# Patient Record
Sex: Male | Born: 1965
Health system: Southern US, Community
[De-identification: ages and names within clinical notes are randomized; demographics above are authoritative.]

## PROBLEM LIST (undated history)

## (undated) DIAGNOSIS — G4719 Other hypersomnia: Secondary | ICD-10-CM

## (undated) DIAGNOSIS — E7800 Pure hypercholesterolemia, unspecified: Secondary | ICD-10-CM

## (undated) DIAGNOSIS — G4733 Obstructive sleep apnea (adult) (pediatric): Secondary | ICD-10-CM

## (undated) DIAGNOSIS — G44009 Cluster headache syndrome, unspecified, not intractable: Secondary | ICD-10-CM

## (undated) DIAGNOSIS — R51 Headache: Secondary | ICD-10-CM

## (undated) DIAGNOSIS — I10 Essential (primary) hypertension: Secondary | ICD-10-CM

## (undated) DIAGNOSIS — J129 Viral pneumonia, unspecified: Secondary | ICD-10-CM

## (undated) DIAGNOSIS — R0600 Dyspnea, unspecified: Secondary | ICD-10-CM

## (undated) DIAGNOSIS — R432 Parageusia: Secondary | ICD-10-CM

## (undated) DIAGNOSIS — G510 Bell's palsy: Secondary | ICD-10-CM

## (undated) DIAGNOSIS — G4700 Insomnia, unspecified: Secondary | ICD-10-CM

## (undated) HISTORY — DX: Headache: R51

## (undated) HISTORY — PX: MOUTH SURGERY: SHX715

## (undated) HISTORY — DX: Essential (primary) hypertension: I10

## (undated) HISTORY — DX: Dyspnea, unspecified: R06.00

## (undated) HISTORY — DX: Obstructive sleep apnea (adult) (pediatric): G47.33

## (undated) HISTORY — PX: HERNIA REPAIR: SHX51

## (undated) HISTORY — DX: Viral pneumonia, unspecified: J12.9

## (undated) HISTORY — DX: Other hypersomnia: G47.19

## (undated) HISTORY — DX: Parageusia: R43.2

## (undated) HISTORY — DX: Insomnia, unspecified: G47.00

## (undated) HISTORY — DX: Pure hypercholesterolemia, unspecified: E78.00

## (undated) HISTORY — DX: Cluster headache syndrome, unspecified, not intractable: G44.009

## (undated) HISTORY — DX: Bell's palsy: G51.0

---

## 1998-05-20 ENCOUNTER — Ambulatory Visit (HOSPITAL_BASED_OUTPATIENT_CLINIC_OR_DEPARTMENT_OTHER): Admission: RE | Admit: 1998-05-20 | Discharge: 1998-05-20 | Payer: Self-pay | Admitting: General Surgery

## 2001-03-02 HISTORY — PX: CHOLECYSTECTOMY: SHX55

## 2001-06-01 ENCOUNTER — Emergency Department (HOSPITAL_COMMUNITY): Admission: EM | Admit: 2001-06-01 | Discharge: 2001-06-01 | Payer: Self-pay | Admitting: *Deleted

## 2001-06-01 ENCOUNTER — Encounter: Payer: Self-pay | Admitting: *Deleted

## 2006-03-02 DIAGNOSIS — G510 Bell's palsy: Secondary | ICD-10-CM

## 2006-03-02 HISTORY — DX: Bell's palsy: G51.0

## 2006-03-02 HISTORY — PX: FOOT SURGERY: SHX648

## 2006-10-26 ENCOUNTER — Emergency Department (HOSPITAL_COMMUNITY): Admission: EM | Admit: 2006-10-26 | Discharge: 2006-10-26 | Payer: Self-pay | Admitting: Emergency Medicine

## 2007-04-06 ENCOUNTER — Encounter: Admission: RE | Admit: 2007-04-06 | Discharge: 2007-06-08 | Payer: Self-pay | Admitting: Family Medicine

## 2007-09-26 ENCOUNTER — Encounter: Admission: RE | Admit: 2007-09-26 | Discharge: 2007-09-26 | Payer: Self-pay | Admitting: Family Medicine

## 2008-02-08 ENCOUNTER — Ambulatory Visit (HOSPITAL_BASED_OUTPATIENT_CLINIC_OR_DEPARTMENT_OTHER): Admission: RE | Admit: 2008-02-08 | Discharge: 2008-02-08 | Payer: Self-pay | Admitting: Orthopedic Surgery

## 2010-06-02 ENCOUNTER — Emergency Department (HOSPITAL_COMMUNITY): Payer: BC Managed Care – PPO

## 2010-06-02 ENCOUNTER — Emergency Department (HOSPITAL_COMMUNITY)
Admission: EM | Admit: 2010-06-02 | Discharge: 2010-06-02 | Disposition: A | Discharge status: Home / Self Care | Payer: BC Managed Care – PPO | Attending: Emergency Medicine | Admitting: Emergency Medicine

## 2010-06-02 DIAGNOSIS — I10 Essential (primary) hypertension: Secondary | ICD-10-CM | POA: Insufficient documentation

## 2010-06-02 DIAGNOSIS — R51 Headache: Secondary | ICD-10-CM | POA: Insufficient documentation

## 2010-07-15 NOTE — Op Note (Signed)
NAME:  Julian Oneal, Julian Oneal                 ACCOUNT NO.:  [id number]   MEDICAL RECORD NO.:  [id number]          PATIENT TYPE:  AMB   LOCATION:  DSC                          FACILITY:  [facility]   PHYSICIAN:  Leonides Grills, M.D.     DATE OF BIRTH:  [birth year]   DATE OF PROCEDURE:  02/08/2008  DATE OF DISCHARGE:                               OPERATIVE REPORT   PREOPERATIVE DIAGNOSIS:  Left hallux rigidus.   POSTOPERATIVE DIAGNOSIS:  Left hallux rigidus.   OPERATION:  Left great toe cheilectomy.   ANESTHESIA:  General.   SURGEON:  Leonides Grills, MD   ASSISTANT:  Richardean Canal, PA-C   ESTIMATED BLOOD LOSS:  Minimal.   TOURNIQUET TIME:  Approximately half hour.   COMPLICATIONS:  None.   DISPOSITION:  Stable to the PR.   INDICATIONS:  This is a 45 year old male who has had longstanding dorsal  left great toe pain that was interfering with his life to the point that  he cannot do what he wants to do.  He has consented to the above  procedure.  All risks including infection, nerve or vessel injury,  persistent pain, worsening pain, prolonged recovery, stiffness,  arthritis, and possibility of future fusion versus hemiarthroplasty were  all explained.  Questions were encouraged and answered.   OPERATIVE PROCEDURE:  The patient was brought to the operating room,  placed in supine position after adequate general endotracheal anesthesia  was administered as well as Ancef 1 g IV piggyback.  Left lower  extremity was then prepped and draped in sterile manner.  Over a  proximally placed thigh tourniquet, limb was gravity exsanguinated,  tourniquet was elevated to 290 mmHg.  Longitudinal incision just medial  to the EHL tendon was then made.  Dissection was carried down through  skin.  Hemostasis was obtained.  EHL tendon was protected within its  sheath and retracted out of harm's way.  Longitudinal capsulotomy was  then made.  Soft tissue was elevated off the dorsal spur both medially  and  laterally, and with a sagittal saw, the dorsal approximately quarter  to third of the metatarsal head was then removed, which was arthritic.  Once this was removed, both medial and lateral gutters were inspected,  and whatever spur was in this area was also debrided as well.  The base  of the proximal phalanx was also debrided of spurs with the rongeur.  The joint area was copiously irrigated with normal saline and then joint  was ranged after, and it was found to have excellent range of motion, no  impingement.  Bone wax was applied to  exposed bone surfaces.  The capsule was closed with a 3-0 Vicryl stitch.  Tourniquet was deflated.  Hemostasis was obtained.  Subcu was closed  with 3-0 Vicryl.  Skin was closed with 4-0 nylon.  Sterile dressing was  applied.  Hard-soled shoe was applied.  The patient was stable to the  PR.      Leonides Grills, M.D.  Electronically Signed     PB/MEDQ  D:  02/08/2008  T:  02/08/2008  Job:  [id number] 

## 2010-12-04 LAB — BASIC METABOLIC PANEL
BUN: 9 mg/dL (ref 6–23)
CO2: 30 mEq/L (ref 19–32)
Calcium: 9.6 mg/dL (ref 8.4–10.5)
Chloride: 103 mEq/L (ref 96–112)
Creatinine, Ser: 0.96 mg/dL (ref 0.4–1.5)
GFR calc Af Amer: >60 mL/min (ref >60)
GFR calc non Af Amer: >60 mL/min (ref >60)
Glucose, Bld: 93 mg/dL (ref 70–99)
Potassium: 3.9 mEq/L (ref 3.5–5.1)
Sodium: 138 mEq/L (ref 135–145)

## 2010-12-04 LAB — POCT HEMOGLOBIN-HEMACUE: Hemoglobin: 15.1 g/dL (ref 13.0–17.0)

## 2010-12-12 LAB — ROCKY MTN SPOTTED FVR AB, IGM-BLOOD: RMSF IgM: 0.1

## 2010-12-12 LAB — ROCKY MTN SPOTTED FVR AB, IGG-BLOOD: RMSF IgG: <1:64 {titer}

## 2010-12-12 LAB — B. BURGDORFI ANTIBODIES: B burgdorferi Ab IgG+IgM: 0.13

## 2011-12-25 ENCOUNTER — Other Ambulatory Visit: Payer: Self-pay | Admitting: Neurology

## 2011-12-25 DIAGNOSIS — H539 Unspecified visual disturbance: Secondary | ICD-10-CM

## 2011-12-25 DIAGNOSIS — R439 Unspecified disturbances of smell and taste: Secondary | ICD-10-CM

## 2011-12-25 DIAGNOSIS — G44059 Short lasting unilateral neuralgiform headache with conjunctival injection and tearing (SUNCT), not intractable: Secondary | ICD-10-CM

## 2012-01-01 ENCOUNTER — Ambulatory Visit
Admission: RE | Admit: 2012-01-01 | Discharge: 2012-01-01 | Disposition: A | Discharge status: Home / Self Care | Payer: BC Managed Care – PPO | Source: Ambulatory Visit | Attending: Neurology | Admitting: Neurology

## 2012-01-01 ENCOUNTER — Other Ambulatory Visit: Payer: BC Managed Care – PPO

## 2012-01-01 DIAGNOSIS — H539 Unspecified visual disturbance: Secondary | ICD-10-CM

## 2012-01-01 DIAGNOSIS — G44059 Short lasting unilateral neuralgiform headache with conjunctival injection and tearing (SUNCT), not intractable: Secondary | ICD-10-CM

## 2012-01-01 DIAGNOSIS — R439 Unspecified disturbances of smell and taste: Secondary | ICD-10-CM

## 2012-07-04 ENCOUNTER — Encounter: Payer: Self-pay | Admitting: Neurology

## 2012-07-05 ENCOUNTER — Ambulatory Visit (INDEPENDENT_AMBULATORY_CARE_PROVIDER_SITE_OTHER): Payer: 59 | Admitting: Neurology

## 2012-07-05 ENCOUNTER — Encounter: Payer: Self-pay | Admitting: Neurology

## 2012-07-05 ENCOUNTER — Other Ambulatory Visit: Payer: Self-pay | Admitting: Neurology

## 2012-07-05 VITALS — BP 136/86 | HR 76 | Ht 70.5 in | Wt 176.0 lb

## 2012-07-05 DIAGNOSIS — G4700 Insomnia, unspecified: Secondary | ICD-10-CM

## 2012-07-05 DIAGNOSIS — R51 Headache: Secondary | ICD-10-CM

## 2012-07-05 DIAGNOSIS — R519 Headache, unspecified: Secondary | ICD-10-CM

## 2012-07-05 DIAGNOSIS — G4733 Obstructive sleep apnea (adult) (pediatric): Secondary | ICD-10-CM

## 2012-07-05 HISTORY — DX: Headache, unspecified: R51.9

## 2012-07-05 HISTORY — DX: Obstructive sleep apnea (adult) (pediatric): G47.33

## 2012-07-05 MED ORDER — LUNESTA 3 MG PO TABS: 3.0000 mg | ORAL_TABLET | Freq: Every day | ORAL | Status: DC

## 2012-07-05 MED ORDER — TRAZODONE HCL 150 MG PO TABS: ORAL_TABLET | ORAL | Status: DC

## 2012-07-05 NOTE — Patient Instructions (Signed)

## 2012-07-05 NOTE — Progress Notes (Signed)
[facility]  Provider:  Dr Vickey Huger Referring Provider: No ref. provider found Primary Care Physician:  Lupita Raider, MD  Chief Complaint  Patient presents with  . Follow-up    rev, sleep, rm 10    HPI:  Julian Oneal is a 47 y.o. male here as a referral from Dr.  Clelia Croft , and returned for a follow up today.  [name]. 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  The patient was treated for Bell's palsy in 2008 when he started taking amitriptyline. Initially he slept well deep and he cleaned. Later he developed visual changes dry mouth, palpitations, and constipation. He also developed frequent headaches that were described as dull, throbbing and responding initially to nonsteroidal anti-inflammatory medication. He had previously the worst headaches with the onset of the Bell's palsy and he still presents with a mild residual left facial weakness. He reports crocodile tears but eating sweets or so a full. Occasional he witnessed a facial tics ,  But these  may not be visible on the outside.  06/24/2011 the patient underwent an MRI of the brain which returned as entirely normal he reports that he was sleeping only maybe 5 hours on trazodone which he had initiated.  He woke up with a headache daily and went to bed on wall space with a headache. He was by a diagnosed polysomnography, which confirmed sleep apnea . A CPAP machine was ordered:  his baseline study she'll 28 apneas and hypopneas per hour of sleep. After the titration to 6 cm water he hadn't residual AHI of only 2.3 per hour of sleep.  But he continued to report morning headaches as well as late evening headaches.  He also reports a right-sided retro-orbital pain, at times has right-sided pulses redness tearing but seems to be a manifestation of SUNCT.  He also continued complaining of insomnia his sleep  had more deteriorated .  The patient  followed upon my referral with Dr. Neale Burly at [facility]. He  was initially responding very well to the treatments and reports a period from October 2013 through January of this year  during which he was  headache free.   Then he developed pneumonia late January /february 2014  had to take several antibiotics and inhalers, following that he  developed insomnia and his headaches resurfaced.  The CT of the head was performed on 01/01/2012 and was entirely normal it was compared to a study from 06-2010  Dr. Onnie Boer  Reports state that the patient reported still abnormalities ,  last visit. He started the patient on topiramate for headache prevention, on a wellness diet for weight loss and recommended daily exercises, he also wanted the patient to have a  trigger point injection, which  has not yet taken place.  I also received a download of his CPAP machine today it is set at 6 cm water and his residual AHI is 0.8, his average daily usage was 5 hours and 13 minutes the patient is 100% compliant.  The Epworth sleepiness score  was 20 points today  Review of Systems: Out of a complete 14 system review, the patient complains of only the following symptoms, and all other reviewed systems are negative. Insomnia, , severe headaches, EDS of 20 points on Epworth.  SOB , facial tics, facial weakness.   History   Social History  . Marital Status:  Married    Spouse Name: N/A    Number of Children: 0  . Years of Education: N/A   Occupational History  . fireman     [employer]   Social History Main Topics  . Smoking status: Former Smoker    Start date: 03/02/1990  . Smokeless tobacco: Not on file  . Alcohol Use: Yes     Comment: occasionally  . Drug Use: No  . Sexually Active: Not on file   Other Topics Concern  . 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 pt denies any illegal drugs, tobacco use(quit in 1992), consumes alcohol occasionally and consumes coffee.    Family History  Problem Relation Age of Onset  . Cancer - Prostate Father   . Parkinson's disease Maternal Uncle   . Alzheimer's disease Paternal Aunt     in their late 62's  . Alzheimer's disease Paternal Uncle     in their late 52's  . Cancer - Prostate Maternal Grandfather     Past Medical History  Diagnosis Date  . Bell's palsy 2008    Dr Anne Hahn  . Insomnia   . Taste absent   . Hypertension   . High cholesterol     Past Surgical History  Procedure Laterality Date  . Hernia repair  [id number]  . Mouth surgery    . Foot surgery  2008    Current Outpatient Prescriptions  Medication Sig Dispense Refill  . ALBUTEROL IN Inhale into the lungs daily. As needed      . Azithromycin (ZITHROMAX Z-PAK PO) Take by mouth. Take as directed for sinusitis      . Choline Fenofibrate (TRILIPIX) 135 MG capsule Take 135 mg by mouth daily.      . Cyanocobalamin (VITAMIN B 12 PO) Take 1,000 mcg by mouth. One tablet sublingual once daily      . FOLIC ACID PO Take 400 mcg by mouth. One tablet daily      . hydrochlorothiazide (HYDRODIURIL) 25 MG tablet Take 25 mg by mouth daily.      Marland Kitchen lisinopril (PRINIVIL,ZESTRIL) 10 MG tablet Take 10 mg by mouth daily.      . Omega-3 Fatty Acids (FISH OIL) 1000 MG CAPS Take by mouth. One tablet three times daily      . tadalafil (CIALIS) 20 MG tablet Take 20 mg by mouth daily as needed for erectile dysfunction. One tablet daily      . tiZANidine (ZANAFLEX) 4 MG tablet Take 4 mg by mouth daily. As needed up to 2 per week      . TOPIRAMATE PO Take 150 mg by mouth at bedtime.      . traZODone (DESYREL) 150 MG tablet Take 150 mg by mouth at bedtime. Take 1/2 at night      . valproate (DEPACON) 500 MG/5ML SOLN Inject into the vein. Give IV once, if partial relief , give dose  repeat      . benzonatate (TESSALON) 200 MG capsule       . chlorpheniramine-HYDROcodone (TUSSIONEX) 10-8 MG/5ML LQCR       . fenofibrate micronized (ANTARA) 130 MG capsule       . HYDROcodone-homatropine (HYCODAN) 5-1.5 MG/5ML syrup       . levofloxacin (LEVAQUIN) 750 MG tablet       . VENTOLIN HFA 108 (90 BASE) MCG/ACT inhaler  No current facility-administered medications for this visit.    Allergies as of 07/05/2012 - Review Complete 07/05/2012  Allergen Reaction Noted  . Amitriptyline  07/04/2012  . Silenor (doxepin hcl)  07/04/2012  . Tribenzor (olmesartan-amlodipine-hctz)  07/04/2012    Vitals: BP 136/86  Pulse 76  Ht 5' 10.5" (1.791 m)  Wt 176 lb (79.833 kg)  BMI 24.89 kg/m2 Last Weight:  Wt Readings from Last 1 Encounters:  07/05/12 176 lb (79.833 kg)   Last Height:   Ht Readings from Last 1 Encounters:  07/05/12 5' 10.5" (1.791 m)   Vision Screening:  Vitals . Physical exam:  General: The patient is awake, fatigued and appears not in acute distress. He is just very tired-  The patient is well groomed. Head: Normocephalic, atraumatic. Neck is supple. Mallampati 2, neck circumference:16 . Cardiovascular:  Regular rate and rhythm, without  murmurs or carotid bruit, and without distended neck veins. Respiratory: Lungs are clear to auscultation. Skin:  Without evidence of edema, or rash Trunk:patient  has normal posture.patient has a normal BMI .   Neurologic exam : The patient is awake and alert, oriented to place and time.  Memory subjective  described as intact.  There is a normal attention span & concentration ability. Speech is fluent without  dysarthria, dysphonia or aphasia.  Mood and affect are depressed.  Cranial nerves: Pupils are equal and briskly reactive to light. Funduscopic exam without  evidence of pallor or edema. Extraocular movements  in vertical and horizontal planes intact and without nystagmus. Visual fields by finger perimetry are  intact. Hearing to finger rub intact.  Facial weakness as residual of old bell's palsy.  Sensation intact to fine touch. Facial ,tongue and uvula move midline.  Motor exam:   Normal tone and normal muscle bulk and symmetric normal strength in all extremities.  Sensory:  Fine touch, pinprick and vibration were tested in all extremities. Proprioception is tested in the upper extremities only. This was  normal.  Coordination: Rapid alternating movements in the fingers/hands is tested and normal. Finger-to-nose maneuver tested and normal without evidence of ataxia, dysmetria or tremor.  Gait and station: Patient walks without assistive device and is able and assisted stool climb up to the exam table.  Strength within normal limits. Stance is stable and normal. Tandem gait is normal .  Deep tendon reflexes: in the  upper and lower extremities are symmetric and intact.   Assessment:  After physical and neurologic examination, review of laboratory studies, imaging, neurophysiology testing and pre-existing records, assessment will be reviewed on the problem list.  Plan:  Treatment plan and additional workup will be reviewed under Problem List.

## 2012-08-02 ENCOUNTER — Ambulatory Visit: Payer: Self-pay | Admitting: *Deleted

## 2012-08-02 ENCOUNTER — Encounter: Payer: Self-pay | Admitting: Neurology

## 2012-08-02 ENCOUNTER — Ambulatory Visit (INDEPENDENT_AMBULATORY_CARE_PROVIDER_SITE_OTHER): Payer: 59 | Admitting: Neurology

## 2012-08-02 VITALS — BP 126/76 | HR 81 | Temp 98.7°F

## 2012-08-02 VITALS — BP 126/76 | HR 81 | Temp 98.7°F | Ht 70.5 in | Wt 181.0 lb

## 2012-08-02 DIAGNOSIS — G44209 Tension-type headache, unspecified, not intractable: Secondary | ICD-10-CM

## 2012-08-02 DIAGNOSIS — G4700 Insomnia, unspecified: Secondary | ICD-10-CM

## 2012-08-02 DIAGNOSIS — G43901 Migraine, unspecified, not intractable, with status migrainosus: Secondary | ICD-10-CM

## 2012-08-02 DIAGNOSIS — G4733 Obstructive sleep apnea (adult) (pediatric): Secondary | ICD-10-CM

## 2012-08-02 DIAGNOSIS — G4730 Sleep apnea, unspecified: Secondary | ICD-10-CM | POA: Insufficient documentation

## 2012-08-02 DIAGNOSIS — G44059 Short lasting unilateral neuralgiform headache with conjunctival injection and tearing (SUNCT), not intractable: Secondary | ICD-10-CM

## 2012-08-02 MED ORDER — TRAZODONE HCL 150 MG PO TABS: ORAL_TABLET | ORAL | Status: DC

## 2012-08-02 MED ORDER — VALPROATE SODIUM 500 MG/5ML IV SOLN: 500.0000 mg | INTRAVENOUS | Status: DC

## 2012-08-02 MED ORDER — DIVALPROEX SODIUM 500 MG PO DR TAB: 500.0000 mg | DELAYED_RELEASE_TABLET | Freq: Two times a day (BID) | ORAL | Status: DC

## 2012-08-02 MED ORDER — TIZANIDINE HCL 4 MG PO TABS: 4.0000 mg | ORAL_TABLET | Freq: Every day | ORAL | Status: DC

## 2012-08-02 MED ORDER — VALPROATE SODIUM 500 MG/5ML IV SOLN
500.0000 mg | INTRAVENOUS | Status: DC
  Administered 2012-08-02: 500 mg via INTRAVENOUS

## 2012-08-02 NOTE — Progress Notes (Signed)
Pt here for appt with Dr. Vickey Huger.  Order for Depacon 500mg  IV and if partial relief may repeat.  Pt to treatment room, Under aseptic technique 22g angiocath inserted to R outer AC with good blood return, taped securely.  IV started with NS/ 500mg  Depacon at 1642 , headache level 3-4, frontal.  Pt made comfortable, lights dimmed. At  1648 headache level to 2, another Depacon 500 mg IV started, finished 1658 headache level to 0-1.   Tolerated well.  NAD.   To check out.

## 2012-08-02 NOTE — Patient Instructions (Addendum)
Pt to call back if problems or concerns.

## 2012-08-02 NOTE — Assessment & Plan Note (Signed)
Trazodone for insomnia works, patient has less headaches on Depakote.

## 2012-08-02 NOTE — Progress Notes (Signed)
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 He was originally referred for evaluation of insomnia which had been chronic.   The patient was treated for Bell's palsy in 2008 when he started taking amitriptyline. Initially he slept well deep and he cleaned. Later he developed visual changes dry mouth, palpitations, and constipation. He also developed frequent headaches that were described as dull, throbbing and responding initially to nonsteroidal anti-inflammatory medication. He had previously the worst headaches with the onset of the Bell's palsy and he still presents with a mild residual left facial weakness.  He reports crocodile tears but eating sweets or so a full. Occasional he witnessed a facial tics , But these may not be visible on the outside.    06/24/2011 the patient underwent an MRI of the brain which returned as entirely normal he reports that he was sleeping only maybe 5 hours on trazodone which he had initiated.  He woke up with a headache daily and went to bed on wall space with a headache. Laboratory results were all in normal limits White blood cell count electrolytes, C. reactive protein, ANA. The CT of the head was performed on 01/01/2012 and read by Dr. Pearlean Brownie is normal.   08-04-11 He was by a diagnosed polysomnography, which confirmed sleep apnea . A CPAP machine was ordered:  Since his baseline study from 08-04-11   had  shown  28 apneas and hypopneas per hour of sleep.  After the titration to 6 cm water his residual AHI of only 2.3 per hour of sleep showed a good success. His intended bedtime is around 10 PM he normally rises about 5 AM, he estimates his nocturnal sleep duration at 1845 hours nightly. Follow last couple of weeks he had less sleep interruption and less sleep fragmentation. He is nor longer doing swing shifts or day to day 24 hour on/ off duty. He changed to a regular daytime work schedule in June 2013.  He has  one older sister but is using CPAP for the treatment of sleep apnea but is unaware of his parents having had any sleep disorder.  The patient has no history of pulmonary disease or cardiac disease and has only been briefly a smoker in 1991-92. But he continued to report morning headaches as well as late evening headaches.  He also reports a right-sided retro-orbital pain, at times has right-sided pulses redness tearing but seems to be a manifestation of SUNCT.   He also continued complaining of insomnia his sleep had more deteriorated .  The patient followed upon my referral with Dr. Neale Burly at the [facility]. He was initially responding very well to the treatments and reports a period from October 2013 through January of this year 2014  during which he was headache free.  Then he developed pneumonia late January /february 2014 had to take several antibiotics and inhalers, following that he developed insomnia and his headaches resurfaced.  Since his last visit in May he has continued to use his CPAP with an average he was at time of about 6 hours residual AHI now 0.7 x 2 days Epworth sleepiness score return to 15 points. 100% compliant at 08-03-12 , one year after titration study.   He continues to have severe headaches and he weaned himself off the topiramate , currently felt that his memory loss was in both also not gone and that his sleep had improved, he was able to  sleep longer but still not necessarily able to fall asleep easily. [name]  had been declined by his insurance. The patient's insurance also declined a Depakote infusion but allows the patient to take 5 mg by mouth up to twice a day for headache prophylaxis. He has been using trazodone for sleep induction with some success. His headaches are left face and head centered with tearing of the eye. He will see his ophthalmologist next in 2 days to address the left eye vision.  He feels his left temple is numb or ' not normal "   He reveals that his insomnia is the major precursor to his headache- severity.   EXAM:   General: The patient is awake, fatigued and appears not in acute distress. He is just very tired- The patient is well groomed.  Head: Normocephalic, atraumatic. Neck is supple. Mallampati 2, neck circumference:16 .  Cardiovascular: Regular rate and rhythm, without murmurs or carotid bruit, and without distended neck veins.  Respiratory: Lungs are clear to auscultation.  Skin: Without evidence of edema, or rash  Trunk:patient has normal posture.patient has a normal BMI .  Neurologic exam :  The patient is awake and alert, oriented to place and time. Memory subjective described as intact.  There is a normal attention span & concentration ability.  Speech is fluent without dysarthria, dysphonia or aphasia.  Mood and affect are depressed.  Cranial nerves:  Pupils are equal and briskly reactive to light. Funduscopic exam without evidence of pallor or edema. Extraocular movements in vertical and horizontal planes intact and without nystagmus. Visual fields by finger perimetry are intact.  Hearing to finger rub intact. Facial weakness as residual of old bell's palsy. Sensation intact to fine touch. Facial ,tongue and uvula move midline.  Motor exam: Normal tone and normal muscle bulk and symmetric normal strength in all extremities.  Sensory: Fine touch, pinprick and vibration were tested in all extremities. Proprioception is tested in the upper extremities only. This was normal.  Coordination: Rapid alternating movements in the fingers/hands is tested and normal. Finger-to-nose maneuver tested and normal without evidence of ataxia, dysmetria or tremor.  Gait and station: Patient walks without assistive device and is able and assisted stool climb up to the exam table.  Strength within normal limits. Stance is stable and normal. Tandem gait is normal .  Deep tendon reflexes: in the upper and lower extremities  are symmetric and intact.    Assessment #1 sleep apnea is under good control with the current CPAP setting. No change is necessary.  Assessment #2 insomnia is only partially improved under the use of trazodone.  Assessment #3 headache is still present and only marginally improved, associated with a dilated left pupil that appears to have a slowed  light reaction. I am glad the patient will see the ophthalmologist in 2 days, I am concerned about pressure buildup in the eye, and is contributing to focal mostly left-sided headaches. I appreciate Dr. Onnie Boer input.   CC:  to  [employer] [facility].

## 2012-08-02 NOTE — Patient Instructions (Addendum)
SUNCT Headache Information A SUNCT headache is a short-lasting, intense headache affecting one side of the head. It is a rare form of headache. It is most common in men after age 47. The disorder is noticeable as bursts of moderate to severe burning, stabbing, or throbbing pain. It usually occurs on one side of the head and around the eye or temple. Attacks typically occur in daytime. Attacks typically last from 5 seconds to 4 minutes. Patients generally have 5 to 6 attacks per hour. SYMPTOMS   Watery eyes, reddish or bloodshot eyes caused by dilation of blood vessels (conjunctival injection).  Nasal congestion (stuffiness), runny nose.  Sweaty forehead.  Swelling of the eyelids.  Increased pressure within the eye on the affected side of head.  Systolic blood pressure may rise during the attacks. Movement of the neck may trigger these headaches. SUNCT headaches may be part of a broader disorder that causes intense pain in the eyes, lips, nose, scalp, forehead, and jaw (trigeminal neuralgia). SUNCT headaches are considered one of a group of headache disorders (trigeminal autonomic cephalgias, or TACs). TREATMENT  These headaches are generally not responsive to usual treatment for short-lasting headaches. Drugs that may relieve symptoms in some patients include:  Corticosteroids.  Anti-epileptic drugs:  Gabapentin.  Lamotrigine.  Carbamazepine. Studies have shown that injections of glycerol to block the facial nerves may provide immediate relief. Headaches recurred in about 40 percent of patients studied.  PROGNOSIS  There is no cure for these headaches. The disorder is not fatal but can cause considerable discomfort. Document Released: 01/10/2004 Document Revised: 05/11/2011 Document Reviewed: 02/04/2007 [facility] Patient Information 2014 [place], [place]. CPAP and BIPAP CPAP and BIPAP are methods of helping you breathe. CPAP stands for "continuous positive airway pressure." BIPAP  stands for "bi-level positive airway pressure." Both CPAP and BIPAP are provided by a small machine with a flexible plastic tube that attaches to a plastic mask that goes over your nose or mouth. Air is blown into your air passages through your nose or mouth. This helps to keep your airways open and helps to keep you breathing well. The amount of pressure that is used to blow the air into your air passages can be set on the machine. The pressure setting is based on your needs. With CPAP, the amount of pressure stays the same while you breathe in and out. With BIPAP, the amount of pressure changes when you inhale and exhale. Your caregiver will recommend whether CPAP or BIPAP would be more helpful for you.  CPAP and BIPAP can be helpful for both adults and children with:  Sleep apnea.  Chronic Obstructive Pulmonary Disease (COPD), a condition like emphysema.  Diseases which weaken the muscles of the chest such as muscular dystrophy or neurological diseases.  Other problems that cause breathing to be weak or difficult. USE OF CPAP OR BIPAP The respiratory therapist or technician will help you get used to wearing the mask. Some people feel claustrophobic (a trapped or closed in feeling) at first, because the mask needs to be fairly snug on your face.   It may help you to get used to the mask gradually, by first holding the mask loosely over your nose or mouth using a low pressure setting on the machine. Gradually the mask can be applied more snugly with increased pressure. You can also gradually increase the amount of time the mask is used.  People with sleep apnea will use the mask and machine at night when they are sleeping. Others,  like those with ALS or other breathing difficulties, may need the CPAP or BIPAP all the time.  If the first mask you try does not fit well, or is uncomfortable, there are other types and sizes that can be tried.  If you tend to breathe through your mouth, a chin strap  may be applied to help keep your mouth closed (if you are using a nasal mask).  The CPAP and BIPAP machines have alarms that may sound if the mask comes off or develops a leak.  You should not eat or drink while the CPAP or BIPAP is on. Food or fluids could get pushed into your lungs by the pressure of the CPAP or BIPAP. Sometimes CPAP or BIPAP machines are ordered for home use. If you are going to use the CPAP or BIPAP machine at home, follow these instructions  CPAP or BIPAP machines can be rented or purchased through home health care companies. There are many different brands of machines available. If you rent a machine before purchasing you may find which particular machine works well for you.  Ask questions if there is something you do not understand when picking out your machine.  Place your CPAP or BIPAP machine on a secure table or stand near an electrical outlet.  Know where the On/Off switch is.  Follow your doctor's instructions for how to set the pressure on your machine and when you should use it.  Do not smoke! Tobacco smoke residue can damage the machine. SEEK IMMEDIATE MEDICAL CARE IF:   You have redness or open areas around your nose or mouth.  You have trouble operating the CPAP or BIPAP machine.  You cannot tolerate wearing the CPAP or BIPAP mask.  You have any questions or concerns. Document Released: 11/15/2003 Document Revised: 05/11/2011 Document Reviewed: 02/14/2008 [facility] Patient Information 2014 [place], [place].

## 2012-09-20 ENCOUNTER — Telehealth: Payer: Self-pay | Admitting: Neurology

## 2012-09-20 NOTE — Telephone Encounter (Signed)
Julian Oneal, this pt. Called stating he needs the oxygen concentrator to be discontinued, he is no longer using it. The info needs to be faxed to [phone] ([facility] [employer])  He keep receiving a bill.

## 2012-09-20 NOTE — Telephone Encounter (Signed)
Tried to call patient at home and at work - he had just left for the day and his voicemail was picking up but not taking messages.  Orders exist for home O2 therapy (in [facility] from 09/19/2011) which were prescribed by Dr. Vickey Huger, however I can see no information that she D/C the O2 therapy, therefore I can't send a D/C order.  I will continue trying to reach patient.  It patient is not using O2 therapy any longer and no D/C order exists, he can always discontinue the O2 against medical advice and the provider will have to pick up the equipment.  I will also route this info to Dr. Vickey Huger to see if she would like him to D/C the O2, if she approves, I can go ahead and fax the D/C order to the appropriate provider.

## 2012-09-21 ENCOUNTER — Telehealth: Payer: Self-pay | Admitting: Neurology

## 2012-09-21 DIAGNOSIS — G44009 Cluster headache syndrome, unspecified, not intractable: Secondary | ICD-10-CM

## 2012-09-21 NOTE — Telephone Encounter (Signed)
D/c 02

## 2012-09-22 NOTE — Telephone Encounter (Signed)
Dr. Vickey Huger approved and gave D/C orders for return of Oxygen therapy.  Faxed orders on 09/22/12 at 3 pm. -sh

## 2012-10-26 ENCOUNTER — Other Ambulatory Visit: Payer: Self-pay | Admitting: Neurology

## 2012-11-02 ENCOUNTER — Ambulatory Visit: Payer: 59 | Admitting: Neurology

## 2012-12-21 ENCOUNTER — Ambulatory Visit (INDEPENDENT_AMBULATORY_CARE_PROVIDER_SITE_OTHER): Payer: 59 | Admitting: Neurology

## 2012-12-21 ENCOUNTER — Encounter: Payer: Self-pay | Admitting: Neurology

## 2012-12-21 VITALS — BP 127/73 | HR 83 | Resp 16 | Ht 70.0 in | Wt 176.0 lb

## 2012-12-21 DIAGNOSIS — G909 Disorder of the autonomic nervous system, unspecified: Secondary | ICD-10-CM

## 2012-12-21 DIAGNOSIS — G902 Horner's syndrome: Secondary | ICD-10-CM

## 2012-12-21 DIAGNOSIS — G4733 Obstructive sleep apnea (adult) (pediatric): Secondary | ICD-10-CM

## 2012-12-21 DIAGNOSIS — G44059 Short lasting unilateral neuralgiform headache with conjunctival injection and tearing (SUNCT), not intractable: Secondary | ICD-10-CM

## 2012-12-21 MED ORDER — LAMOTRIGINE 25 MG PO TABS: 25.0000 mg | ORAL_TABLET | Freq: Every day | ORAL | Status: DC

## 2012-12-21 NOTE — Progress Notes (Signed)
[facility]  Provider:  Melvyn Novas, M D  Referring Provider: Lupita Raider, MD Primary Care Physician:  Julian Raider, MD  Chief Complaint  Patient presents with  . 3 MO F/U    Pt's sleep card was downloaded at [facility] today    HPI:  Julian Oneal is a 47 y.o. male  Is seen here as a referral/ revisit  from Julian Oneal for hypersomnia,  OSA and chronic SUNCT headaches.   See details of last note. He advised today of 3-4  Headaches per week, severe lethargia and sleep improvement under 150 mg Trazodone.  Download showed good compliance , 472 days, residual AHi of 0.7 at 6 cm water,  5 hours and 52 minutes.    History ;08-04-11 He was by a diagnosed polysomnography, which confirmed sleep apnea . A CPAP machine was ordered: Since his baseline study from 08-04-11 had shown 28 apneas and hypopneas per hour of sleep.  After the titration to 6 cm water his residual AHI of only 2.3 per hour of sleep showed a good success. His intended bedtime is around 10 PM he normally rises about 5 AM, he estimates his nocturnal sleep duration at 1845 hours nightly. Follow last couple of weeks he had less sleep interruption and less sleep fragmentation. He is nor longer doing swing shifts or day to day 24 hour on/ off duty.  He changed to a regular daytime work schedule in June 2013.  He has one older sister but is using CPAP for the treatment of sleep apnea but is unaware of his parents having had any sleep disorder.  The patient has no history of pulmonary disease or cardiac disease and has only been briefly a smoker in 1991-92.  But he continued to report morning headaches as well as late evening headaches.  He also reports a right-sided retro-orbital pain, at times has right-sided pulses redness tearing but seems to be a manifestation of SUNCT.  He also continued complaining of insomnia his sleep had more deteriorated .  The patient followed upon my referral with Julian Oneal at [facility]. He was initially responding very well to the treatments and reports a period from October 2013 through January of this year 2014 during which he was headache free.  Then he developed pneumonia late January /february 2014 had to take several antibiotics and inhalers, following that he developed insomnia and his headaches resurfaced.      Review of Systems: Out of a complete 14 system review, the patient complains of only the following symptoms, and all other reviewed systems are negative. Epworth  17   FSS 47, no depression score obtained.   History   Social History  . Marital Status: Married    Spouse Name: N/A    Number of Children: 0  . Years of Education: N/A   Occupational History  . fireman     [employer]   Social History Main Topics  . Smoking status: Former Smoker    Start date: 03/02/1990  . Smokeless tobacco: Not on file  . Alcohol Use: Yes     Comment: occasionally  . Drug Use: No  . Sexual Activity: Not on file   Other Topics Concern  . 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 pt denies any illegal drugs, tobacco use(quit in 1992),  consumes alcohol occasionally and consumes coffee.    Family History  Problem Relation Age of Onset  . Cancer - Prostate Father   . Parkinson's disease Maternal Uncle   . Alzheimer's disease Paternal Aunt     in their late 73's  . Alzheimer's disease Paternal Uncle     in their late 73's  . Cancer - Prostate Maternal Grandfather     Past Medical History  Diagnosis Date  . Bell's palsy 2008    Dr Julian Oneal  . Insomnia   . Taste absent   . Hypertension   . High cholesterol     Past Surgical History  Procedure Laterality Date  . Hernia repair  [id number]  . Mouth surgery    . Foot surgery  2008    Current Outpatient Prescriptions   Medication Sig Dispense Refill  . Cyanocobalamin (VITAMIN B 12 PO) Take 1,000 mcg by mouth. One tablet sublingual once daily      . divalproex (DEPAKOTE) 500 MG DR tablet Take 1 tablet (500 mg total) by mouth 2 (two) times daily.  60 tablet  2  . ESZOPICLONE 3 MG tablet Take 1 tablet (3 mg total) by mouth at bedtime. Take immediately before bedtime  7 tablet  0  . fenofibrate micronized (ANTARA) 130 MG capsule       . FOLIC ACID PO Take 400 mcg by mouth. One tablet daily      . hydrochlorothiazide (HYDRODIURIL) 25 MG tablet Take 25 mg by mouth daily.      Marland Kitchen lisinopril (PRINIVIL,ZESTRIL) 10 MG tablet Take 10 mg by mouth daily.      . Omega-3 Fatty Acids (FISH OIL) 1000 MG CAPS Take by mouth. One tablet three times daily      . tiZANidine (ZANAFLEX) 4 MG tablet Take 1 tablet (4 mg total) by mouth daily. As needed up to 2 per week  30 tablet  2  . traZODone (DESYREL) 150 MG tablet TAKE 1/2 TABLET OF 300MG  AT NIGHT OR A FULL TABLET AT NIGHT  30 tablet  3  . ALBUTEROL IN Inhale into the lungs daily. As needed      . Choline Fenofibrate (TRILIPIX) 135 MG capsule Take 135 mg by mouth daily.      . tadalafil (CIALIS) 20 MG tablet Take 20 mg by mouth daily as needed for erectile dysfunction. One tablet daily      . VENTOLIN HFA 108 (90 BASE) MCG/ACT inhaler        Current Facility-Administered Medications  Medication Dose Route Frequency Provider Last Rate Last Dose  . valproate (DEPACON) 500 mg in sodium chloride 0.9 % 100 mL IVPB  500 mg Intravenous Continuous Julian Mylar Stephfon Bovey, MD   500 mg at 08/02/12 1642    Allergies as of 12/21/2012 - Review Complete 12/21/2012  Allergen Reaction Noted  . Amitriptyline  07/04/2012  . Silenor [doxepin hcl]  07/04/2012  . Tribenzor [olmesartan-amlodipine-hctz]  07/04/2012    Vitals: BP 127/73  Pulse 83  Resp 16  Ht 5\' 10"  (1.778 m)  Wt 176 lb (79.833 kg)  BMI 25.25 kg/m2 Last Weight:  Wt Readings from Last 1 Encounters:  12/21/12 176 lb (79.833 kg)    Last Height:   Ht Readings from Last 1 Encounters:  12/21/12 5\' 10"  (1.778 m)   EXAM:  General: The patient is awake, fatigued and appears not in acute distress. He is just very tired- The patient is well groomed.  Head: Normocephalic, atraumatic. Neck is supple. Mallampati 2,  neck circumference:16 .  Cardiovascular: Regular rate and rhythm, without murmurs or carotid bruit, and without distended neck veins.  Respiratory: Lungs are clear to auscultation.  Skin: Without evidence of edema, or rash  Trunk:patient has normal posture.patient has a normal BMI .  Neurologic exam :  The patient is awake and alert, oriented to place and time. Memory subjective described as intact.  There is a normal attention span & concentration ability.  Speech is fluent without dysarthria, dysphonia or aphasia.  Mood and affect are depressed.  Cranial nerves:  Pupils are equal and briskly reactive to light. Funduscopic exam without evidence of pallor or edema. Extraocular movements in vertical and horizontal planes intact and without nystagmus. Visual fields by finger perimetry are intact.  Hearing to finger rub intact. Facial weakness as residual of old bell's palsy. Sensation intact to fine touch. Facial ,tongue and uvula move midline.  Motor exam: Normal tone and normal muscle bulk and symmetric normal strength in all extremities.  Sensory: Fine touch, pinprick and vibration were tested in all extremities. Proprioception is tested in the upper extremities only. This was normal.  Coordination: Rapid alternating movements in the fingers/hands is tested and normal. Finger-to-nose maneuver tested and normal without evidence of ataxia, dysmetria or tremor.  Gait and station: Patient walks without assistive device and is able and assisted stool climb up to the exam table.  Strength within normal limits. Stance is stable and normal. Tandem gait is normal .  Deep tendon reflexes: in the upper and lower extremities  are symmetric and intact.  Assessment #1 sleep apnea is under good control with the current CPAP setting. No change is necessary.  Assessment #2 insomnia is  partially improved under the use of trazodone.  Assessment #3 headache is still present, but improved, associated with a dilated left pupil that appears to have a slowed light reaction. His  Left eye appears to protrude  Or the left eye sunken and maller .  HORNER or SUNCT ?   I asked him to see the ophthalmologist in 2 days, Dr. Hazle Quant.  Iwill start lamictal for the headaches and order a carotid doppler.  I am concerned about pressure buildup in the eye, and is contributing to focal mostly left-sided headaches.

## 2012-12-21 NOTE — Patient Instructions (Signed)
SUNCT Headache Information A SUNCT headache is a short-lasting, intense headache affecting one side of the head. It is a rare form of headache. It is most common in men after age 48. The disorder is noticeable as bursts of moderate to severe burning, stabbing, or throbbing pain. It usually occurs on one side of the head and around the eye or temple. Attacks typically occur in daytime. Attacks typically last from 5 seconds to 4 minutes. Patients generally have 5 to 6 attacks per hour. SYMPTOMS   Watery eyes, reddish or bloodshot eyes caused by dilation of blood vessels (conjunctival injection).  Nasal congestion (stuffiness), runny nose.  Sweaty forehead.  Swelling of the eyelids.  Increased pressure within the eye on the affected side of head.  Systolic blood pressure may rise during the attacks. Movement of the neck may trigger these headaches. SUNCT headaches may be part of a broader disorder that causes intense pain in the eyes, lips, nose, scalp, forehead, and jaw (trigeminal neuralgia). SUNCT headaches are considered one of a group of headache disorders (trigeminal autonomic cephalgias, or TACs). TREATMENT  These headaches are generally not responsive to usual treatment for short-lasting headaches. Drugs that may relieve symptoms in some patients include:  Corticosteroids.  Anti-epileptic drugs:  Gabapentin.  Lamotrigine.  Carbamazepine. Studies have shown that injections of glycerol to block the facial nerves may provide immediate relief. Headaches recurred in about 40 percent of patients studied.  PROGNOSIS  There is no cure for these headaches. The disorder is not fatal but can cause considerable discomfort. Document Released: 01/10/2004 Document Revised: 05/11/2011 Document Reviewed: 02/04/2007 [facility] Patient Information 2014 [place], [place].

## 2012-12-22 ENCOUNTER — Encounter: Payer: Self-pay | Admitting: Neurology

## 2012-12-27 ENCOUNTER — Telehealth: Payer: Self-pay | Admitting: Neurology

## 2012-12-27 ENCOUNTER — Telehealth: Payer: Self-pay | Admitting: *Deleted

## 2012-12-27 DIAGNOSIS — G44059 Short lasting unilateral neuralgiform headache with conjunctival injection and tearing (SUNCT), not intractable: Secondary | ICD-10-CM

## 2012-12-27 MED ORDER — ACYCLOVIR 400 MG PO TABS: 400.0000 mg | ORAL_TABLET | Freq: Two times a day (BID) | ORAL | Status: DC

## 2012-12-27 NOTE — Telephone Encounter (Signed)
Message copied by Hermenia Fiscal on Tue Dec 27, 2012  4:00 PM ------      Message from: [facility], [place]      Created: Tue Dec 27, 2012  1:44 PM       Please call steven Minassian ,  Born [phone] and inform him of a trial of acyclovir.  ------

## 2012-12-27 NOTE — Telephone Encounter (Signed)
Please call patient -Julian Oneal called me with the early results of his eye tests, He thinks this is a left bells and right sided possible viral mediated , herpetic  change - treat empirically with acyclovir.  Start PO  acycovir  BID po. 15 days .

## 2012-12-27 NOTE — Telephone Encounter (Signed)
I called home, could not LM, then work he was not in.

## 2012-12-28 NOTE — Telephone Encounter (Signed)
I called and spoke with patient. We will fax over his prescription to his pharmacy. He is to take his medication 2 X day.   Patient also would like to know if Dr. Vickey Huger has decided on referring him for a doppler of his neck.  Please advise.

## 2012-12-29 ENCOUNTER — Other Ambulatory Visit: Payer: Self-pay | Admitting: Neurology

## 2012-12-29 DIAGNOSIS — R0989 Other specified symptoms and signs involving the circulatory and respiratory systems: Secondary | ICD-10-CM

## 2012-12-29 NOTE — Telephone Encounter (Signed)
Spoke to [place] in referrals (concerning vascular study), these have been sent to Fannie Knee to schedule.

## 2013-01-05 ENCOUNTER — Other Ambulatory Visit: Payer: Self-pay

## 2013-01-13 ENCOUNTER — Ambulatory Visit (INDEPENDENT_AMBULATORY_CARE_PROVIDER_SITE_OTHER): Payer: 59

## 2013-01-13 DIAGNOSIS — R0989 Other specified symptoms and signs involving the circulatory and respiratory systems: Secondary | ICD-10-CM

## 2013-01-13 DIAGNOSIS — H5310 Unspecified subjective visual disturbances: Secondary | ICD-10-CM

## 2013-01-25 ENCOUNTER — Other Ambulatory Visit: Payer: Self-pay | Admitting: Neurology

## 2013-03-23 ENCOUNTER — Ambulatory Visit: Payer: 59 | Admitting: Nurse Practitioner

## 2013-04-28 ENCOUNTER — Ambulatory Visit (INDEPENDENT_AMBULATORY_CARE_PROVIDER_SITE_OTHER): Payer: 59 | Admitting: Nurse Practitioner

## 2013-04-28 ENCOUNTER — Encounter: Payer: Self-pay | Admitting: Nurse Practitioner

## 2013-04-28 VITALS — BP 150/95 | HR 80 | Temp 98.4°F | Ht 70.5 in | Wt 177.0 lb

## 2013-04-28 DIAGNOSIS — R519 Headache, unspecified: Secondary | ICD-10-CM

## 2013-04-28 DIAGNOSIS — G4730 Sleep apnea, unspecified: Secondary | ICD-10-CM

## 2013-04-28 DIAGNOSIS — R51 Headache: Secondary | ICD-10-CM

## 2013-04-28 DIAGNOSIS — G4733 Obstructive sleep apnea (adult) (pediatric): Secondary | ICD-10-CM

## 2013-04-28 DIAGNOSIS — G4700 Insomnia, unspecified: Secondary | ICD-10-CM

## 2013-04-28 MED ORDER — TIZANIDINE HCL 4 MG PO TABS: 4.0000 mg | ORAL_TABLET | Freq: Every day | ORAL | Status: DC

## 2013-04-28 MED ORDER — DIVALPROEX SODIUM ER 500 MG PO TB24: ORAL_TABLET | ORAL | Status: DC

## 2013-04-28 NOTE — Progress Notes (Signed)
I agree with the assessment and plan as directed by NP .The patient is known to me .   Randol Zumstein, MD  

## 2013-04-28 NOTE — Progress Notes (Signed)
[facility]  PATIENT: Julian Oneal DOB: 12-23-1965   REASON FOR VISIT: Followup for persistent headaches and obstructive sleep apnea   HISTORY OF PRESENT ILLNESS: Mr. Zurlo, 48 year old male returns for followup. He was last seen in this office by Dr. Brett Fairy 12/21/2012. He has confirmed sleep apnea as well as chronic SUNCT headaches. He has been on tizanidine when necessary and it's not helping much. He has never taken the medication daily. Topamax caused memory loss, amitriptyline caused more headaches, doxepin causes more headaches. He was started on Lamictal at his last visit he has stopped that medication. Depacon infusion has worked for him in the past. He is compliant with his CPAP. He is having 3-4 headaches a week, does not have a record . He returns for reevaluation. He denies taking anything over-the-counter   HISTORY: hypersomnia, OSA and chronic SUNCT headaches.  See details of last note. He advised today of 3-4 Headaches per week, severe lethargia and sleep improvement under 150 mg Trazodone.  Download showed good compliance , 472 days, residual AHi of 0.7 at 6 cm water, 5 hours and 52 minutes.  History ;08-04-11 He was by a diagnosed polysomnography, which confirmed sleep apnea . A CPAP machine was ordered: Since his baseline study from 08-04-11 had shown 28 apneas and hypopneas per hour of sleep.  After the titration to 6 cm water his residual AHI of only 2.3 per hour of sleep showed a good success. His intended bedtime is around 10 PM he normally rises about 5 AM, he estimates his nocturnal sleep duration at 1845 hours nightly. Follow last couple of weeks he had less sleep interruption and less sleep fragmentation. He is nor longer doing swing shifts or day to day 24 hour on/ off duty.  He changed to a regular daytime work schedule in June 2013.  He has one older sister but is using CPAP for the treatment of sleep apnea but is unaware of his parents having had  any sleep disorder.  The patient has no history of pulmonary disease or cardiac disease and has only been briefly a smoker in 1991-92.  But he continued to report morning headaches as well as late evening headaches.  He also reports a right-sided retro-orbital pain, at times has right-sided pulses redness tearing but seems to be a manifestation of SUNCT.  He also continued complaining of insomnia his sleep had more deteriorated .  The patient followed upon my referral with Dr. Domingo Cocking at [facility]. He was initially responding very well to the treatments and reports a period from October 2013 through January of this year 2014 during which he was headache free.  Then he developed pneumonia late January /february 2014 had to take several antibiotics and inhalers, following that he developed insomnia and his headaches resurfaced.   REVIEW OF SYSTEMS: Full 14 system review of systems performed and notable only for those listed, all others are neg:  Constitutional: N/A  Cardiovascular: N/A  Ear/Nose/Throat: N/A  Skin: N/A  Eyes: Sensitivity to light Respiratory: N/A  Gastroitestinal: N/A  Hematology/Lymphatic: N/A  Endocrine: N/A Musculoskeletal:N/A  Allergy/Immunology: N/A  Neurological: Headache Psychiatric: N/A Sleep OSA   ALLERGIES: Allergies  Allergen Reactions  . Amitriptyline     headaches  . Silenor [Doxepin Hcl]     headaches  . Tribenzor [Olmesartan-Amlodipine-Hctz]     headaches  . Topiramate     Severe headaches, memory loss    HOME MEDICATIONS: Outpatient Prescriptions Prior to Visit  Medication Sig Dispense Refill  . ALBUTEROL IN Inhale into the lungs daily. As needed      . Choline Fenofibrate (TRILIPIX) 135 MG capsule Take 135 mg by mouth daily.      . Cyanocobalamin (VITAMIN B 12 PO) Take 1,000 mcg by mouth. One tablet sublingual once daily      . fenofibrate micronized (ANTARA) 130 MG capsule       . FOLIC ACID PO Take 322 mcg by mouth.  One tablet daily      . hydrochlorothiazide (HYDRODIURIL) 25 MG tablet Take 25 mg by mouth daily.      Marland Kitchen lamoTRIgine (LAMICTAL) 25 MG tablet Take 1 tablet (25 mg total) by mouth daily.  30 tablet  3  . lisinopril (PRINIVIL,ZESTRIL) 10 MG tablet Take 10 mg by mouth daily.      . Omega-3 Fatty Acids (FISH OIL) 1000 MG CAPS Take by mouth. One tablet three times daily      . tadalafil (CIALIS) 20 MG tablet Take 20 mg by mouth daily as needed for erectile dysfunction. One tablet daily      . tiZANidine (ZANAFLEX) 4 MG tablet Take 1 tablet (4 mg total) by mouth daily. As needed up to 2 per week  30 tablet  2  . traZODone (DESYREL) 150 MG tablet TAKE 1/2 TABLET OF 300MG  AT NIGHT OR A FULL TABLET AT NIGHT  30 tablet  3  . VENTOLIN HFA 108 (90 BASE) MCG/ACT inhaler       . acyclovir (ZOVIRAX) 400 MG tablet Take 1 tablet (400 mg total) by mouth 2 (two) times daily.  30 tablet  0  . divalproex (DEPAKOTE) 500 MG DR tablet Take 1 tablet (500 mg total) by mouth 2 (two) times daily.  60 tablet  2  . ESZOPICLONE 3 MG tablet Take 1 tablet (3 mg total) by mouth at bedtime. Take immediately before bedtime  7 tablet  0   No facility-administered medications prior to visit.    PAST MEDICAL HISTORY: Past Medical History  Diagnosis Date  . Bell's palsy 2008    Dr Jannifer Franklin  . Insomnia   . Taste absent   . Hypertension   . High cholesterol     PAST SURGICAL HISTORY: Past Surgical History  Procedure Laterality Date  . Hernia repair  [id number]  . Mouth surgery    . Foot surgery  2008    FAMILY HISTORY: Family History  Problem Relation Age of Onset  . Cancer - Prostate Father   . Parkinson's disease Maternal Uncle   . Alzheimer's disease Paternal Aunt     in their late 77's  . Alzheimer's disease Paternal Uncle     in their late 77's  . Cancer - Prostate Maternal Grandfather     SOCIAL HISTORY: History   Social History  . Marital Status: Married    Spouse Name: N/A    Number of Children: 0  .  Years of Education: N/A   Occupational History  . [employer]   Social History Main Topics  . Smoking status: Former Smoker    Start date: 03/02/1990  . Smokeless tobacco: Not on file  . Alcohol Use: Yes     Comment: occasionally  . Drug Use: No  . Sexual Activity: Not on file   Other Topics Concern  . 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 pt denies any illegal drugs, tobacco use(quit in 1992), consumes alcohol occasionally and consumes coffee.     PHYSICAL EXAM  Filed Vitals:   04/28/13 1029  BP: 150/95  Pulse: 80  Temp: 98.4 F (36.9 C)  TempSrc: Oral  Height: 5' 10.5" (1.791 m)  Weight: 177 lb (80.287 kg)   Body mass index is 25.03 kg/(m^2).  Generalized: Well developed, in no acute distress  Head: normocephalic and atraumatic,. Oropharynx benign  Neck: Supple, no carotid bruits  Cardiac: Regular rate rhythm, no murmur  Musculoskeletal: No deformity   Neurological examination   Mentation: Alert oriented to time, place, history taking. Follows all commands speech and language fluent  Cranial nerve II-XII: Pupils were equal round reactive to light extraocular movements were full, visual field were full on confrontational test. Facial weakness residual from old Bell's palsy on the left. Mild ptosis of the left eye Hearing was intact to finger rubbing bilaterally. Uvula tongue midline. head turning and shoulder shrug were normal and symmetric.Tongue protrusion into cheek strength was normal. Motor: normal bulk and tone, full strength in the BUE, BLE, fine finger movements normal, no pronator drift. No focal weakness Sensory: normal and symmetric to light touch, pinprick, and  vibration  Coordination: finger-nose-finger, heel-to-shin bilaterally, no dysmetria Reflexes: Brachioradialis 2/2, biceps  2/2, triceps 2/2, patellar 2/2, Achilles 2/2, plantar responses were flexor bilaterally. Gait and Station: Rising up from seated position without assistance, normal stance,  moderate stride, good arm swing, smooth turning, able to perform tiptoe, and heel walking without difficulty. Tandem gait is steady  DIAGNOSTIC DATA (LABS, IMAGING, TESTING) - ASSESSMENT AND PLAN  49 y.o. year old male  has a past medical history of Bell's palsy (2008); Insomnia; is sleep apnea on CPAP and persistent headaches,( SUNCT).   Take Tizanidine at night not prn  Depakote 500mg  ER  po hs for 1 week then increase to 1 twice daily Keep record of headches F/U in 3 months  Dennie Bible, [facility], [facility], [facility] [address], [place], [place] [address] [phone]

## 2013-04-28 NOTE — Patient Instructions (Signed)
Take Tizanidine at night Depakote 500mg  po hs for 1 week then increase to 1 twice daily Keep record of headches F/U in 3 months

## 2013-05-14 ENCOUNTER — Encounter: Payer: Self-pay | Admitting: Neurology

## 2013-05-15 ENCOUNTER — Telehealth: Payer: Self-pay | Admitting: Nurse Practitioner

## 2013-05-15 NOTE — Telephone Encounter (Signed)
Julian Oneal please see if he can come for Depakon infusion, according to Dr. Edwena Felty previous notes that has worked in the past and I can go up on his preventive. Thanks

## 2013-05-17 NOTE — Telephone Encounter (Signed)
I have called patient numerous times and have been unable to leave message.  I have also left an e-mail message.

## 2013-05-19 ENCOUNTER — Telehealth: Payer: Self-pay | Admitting: Neurology

## 2013-05-19 NOTE — Telephone Encounter (Signed)
Pt states headaches are getting stronger and pt would like to be able to get in to see Dr. Brett Fairy to go over this and also see if there is anything that could be called in stronger than the divalproex (DEPAKOTE ER) 500 MG 24 hr tablet that he is taking. Pt does not have a problem with NP/CM but would like a visit with Dr. Brett Fairy since the headaches are getting worse and would like to discuss with the Dr. Marina Gravel, also I have added his cell phone # with being on call he would rather [place] call it.

## 2013-05-22 NOTE — Telephone Encounter (Signed)
Left message for patient that he can come in for Depacon infusion also can go up on Depakote.  Asked him to return my call.

## 2013-05-22 NOTE — Telephone Encounter (Signed)
Butch Penny please call patient he can come in for infusion of Depacon, this might help with his headaches. In addition we can go up on his Depakote to  3 daily if he wants.According to the med list he is on 2 daily.  Please schedule an appt with Dr. Brett Fairy per patient request.

## 2013-05-22 NOTE — Telephone Encounter (Signed)
Pt returned Donna's call.  He states that the call was regarding medications.

## 2013-05-25 NOTE — Telephone Encounter (Signed)
Pt checking to see if there was an apt with Dr. Brett Fairy, I did find one and put pt on the apt with Dr. Brett Fairy. Closing the encounter.

## 2013-06-20 ENCOUNTER — Encounter: Payer: Self-pay | Admitting: Neurology

## 2013-06-20 ENCOUNTER — Ambulatory Visit (INDEPENDENT_AMBULATORY_CARE_PROVIDER_SITE_OTHER): Payer: 59 | Admitting: Neurology

## 2013-06-20 VITALS — BP 116/75 | HR 76 | Resp 17 | Ht 71.25 in | Wt 170.0 lb

## 2013-06-20 DIAGNOSIS — G44009 Cluster headache syndrome, unspecified, not intractable: Secondary | ICD-10-CM

## 2013-06-20 DIAGNOSIS — R519 Headache, unspecified: Secondary | ICD-10-CM

## 2013-06-20 DIAGNOSIS — J129 Viral pneumonia, unspecified: Secondary | ICD-10-CM | POA: Insufficient documentation

## 2013-06-20 DIAGNOSIS — R51 Headache: Secondary | ICD-10-CM

## 2013-06-20 HISTORY — DX: Cluster headache syndrome, unspecified, not intractable: G44.009

## 2013-06-20 HISTORY — DX: Viral pneumonia, unspecified: J12.9

## 2013-06-20 MED ORDER — SUVOREXANT 15 MG PO TABS: 15.0000 mg | ORAL_TABLET | ORAL | Status: DC

## 2013-06-20 MED ORDER — INDOMETHACIN 50 MG PO CAPS: 50.0000 mg | ORAL_CAPSULE | Freq: Two times a day (BID) | ORAL | Status: DC

## 2013-06-20 NOTE — Assessment & Plan Note (Signed)
Failed zanaflex and depakote.

## 2013-06-20 NOTE — Progress Notes (Signed)
[facility]  Provider:  Larey Seat, M D  Referring Provider: Mayra Neer, MD Primary Care Physician:  Mayra Neer, MD  Chief Complaint  Patient presents with  . Follow-up    Room 10  . Headache    HPI:  Julian Oneal is a 48 y.o. male  Is seen here as a referral/ revisit  from Dr. Derrill Memo for hypersomnia,  Followed for  OSA and severe Neuralgiform headaches. He has memory loss reported , too.     See details of last note. He advised today of 3-4  Headaches per week, severe lethargia and sleep improvement under 150 mg Trazodone.  Download showed good compliance , 472 days, residual AHi of 0.7 at 6 cm water,  5 hours and 52 minutes.    History ;08-04-11 He was by a diagnosed polysomnography, which confirmed sleep apnea . A CPAP machine was ordered: Since his baseline study from 08-04-11 had shown 28 apneas and hypopneas per hour of sleep.  After the titration to 6 cm water his residual AHI of only 2.3 per hour of sleep showed a good success. His intended bedtime is around 10 PM he normally rises about 5 AM, he estimates his nocturnal sleep duration at 1845 hours nightly. Follow last couple of weeks he had less sleep interruption and less sleep fragmentation. He is nor longer doing swing shifts or day to day 24 hour on/ off duty.  He changed to a regular daytime work schedule in June 2013.  He has one older sister but is using CPAP for the treatment of sleep apnea but is unaware of his parents having had any sleep disorder.  The patient has no history of pulmonary disease or cardiac disease and has only been briefly a smoker in 1991-92.  But he continued to report morning headaches as well as late evening headaches.  He also reports a right-sided retro-orbital pain, at times has right-sided pulses , but visible tome is a tic of the left eye/  He also continued complaining of insomnia - his sleep had more deteriorated again since returning to work .  He  visited with Dr. Domingo Cocking at [facility], who started him on Topamax, but became unable to verbally communicate.   He was initially responding very well to the treatments and reports a period from October 2013 through January of this year 2014 during which he was headache free.  Then he developed pneumonia late January /february 2014 had to take several antibiotics and inhalers, following that he developed insomnia and his headaches resurfaced.      Review of Systems: Out of a complete 14 system review, the patient complains of only the following symptoms, and all other reviewed systems are negative. Epworth  17   FSS 47, no depression score obtained.   History   Social History  . Marital Status: Married    Spouse Name: N/A    Number of Children: 0  . 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     Social History Main Topics  . Smoking status: Former Smoker    Start date: 03/02/1990  . Smokeless tobacco: Former Systems developer    Quit date: 03/02/1990  . Alcohol Use: Yes     Comment: occasionally  . Drug Use: No  . Sexual Activity: Not on file   Other Topics Concern  . 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 pt denies any illegal drugs, tobacco use(quit in 1992), consumes alcohol occasionally and consumes coffee.    Family History  Problem Relation Age of Onset  . Cancer - Prostate Father   . Parkinson's disease Maternal Uncle   . Alzheimer's disease Paternal Aunt     in their late 100's  . Alzheimer's disease Paternal Uncle     in their late 15's  . Cancer - Prostate Maternal Grandfather     Past Medical History  Diagnosis Date  . Bell's palsy 2008    Dr Jannifer Franklin  . Insomnia   . Taste absent   . Hypertension   . High cholesterol   . Headache(784.0)   .  OSA on CPAP     Past Surgical History  Procedure Laterality Date  . Hernia repair  [id number]  . Mouth surgery    . Foot surgery  2008    Current Outpatient Prescriptions  Medication Sig Dispense Refill  . ALBUTEROL IN Inhale into the lungs daily. As needed      . Cyanocobalamin (VITAMIN B 12 PO) Take 1,000 mcg by mouth. One tablet sublingual once daily      . divalproex (DEPAKOTE ER) 500 MG 24 hr tablet 1 po hs for 1 week then increase to 1 twice daily  60 tablet  5  . fenofibrate micronized (ANTARA) 130 MG capsule       . FOLIC ACID PO Take 323 mcg by mouth. One tablet daily      . hydrochlorothiazide (HYDRODIURIL) 25 MG tablet Take 25 mg by mouth daily.      Marland Kitchen lisinopril (PRINIVIL,ZESTRIL) 10 MG tablet Take 10 mg by mouth daily.      . Omega-3 Fatty Acids (FISH OIL) 1000 MG CAPS Take by mouth. One tablet three times daily      . tadalafil (CIALIS) 20 MG tablet Take 20 mg by mouth daily as needed for erectile dysfunction. One tablet daily      . tiZANidine (ZANAFLEX) 4 MG tablet Take 1 tablet (4 mg total) by mouth at bedtime.  30 tablet  5  . traZODone (DESYREL) 150 MG tablet TAKE 1/2 TABLET OF 300MG  AT NIGHT OR A FULL TABLET AT NIGHT  30 tablet  3  . VENTOLIN HFA 108 (90 BASE) MCG/ACT inhaler        No current facility-administered medications for this visit.    Allergies as of 06/20/2013 - Review Complete 06/20/2013  Allergen Reaction Noted  . Amitriptyline  07/04/2012  . Silenor [doxepin hcl]  07/04/2012  . Tribenzor [olmesartan-amlodipine-hctz]  07/04/2012  . Topiramate  04/28/2013    Vitals: BP 116/75  Pulse 76  Resp 17  Ht 5' 11.25" (1.81 m)  Wt 170 lb (77.111 kg)  BMI 23.54 kg/m2 Last Weight:  Wt Readings from Last 1 Encounters:  06/20/13 170 lb (77.111 kg)   Last Height:   Ht Readings from Last 1 Encounters:  06/20/13 5' 11.25" (1.81 m)   EXAM:  General: The patient is awake, fatigued and appears not in acute distress. He is just very tired- The patient is  well groomed.  Head: Normocephalic, atraumatic. Neck is supple. Mallampati 2, neck circumference:16 .  Cardiovascular: Regular rate and rhythm, without murmurs or carotid bruit, and without distended neck veins.  Respiratory: Lungs are clear to auscultation.  Skin: Without evidence of edema, or rash  Trunk:patient has normal posture.patient has a normal BMI .  Neurologic exam :  The patient is awake and alert, oriented to place and time.  Memory subjective described as impaired, MOCA 24-30, failed 4 out of 5 recall words, repeat of a sentence and reduced word fluency .   Speech is fluent without dysarthria, dysphonia or aphasia.  Mood and affect are depressed.  Cranial nerves:  Pupils are equal and briskly reactive to light. Funduscopic exam without evidence of pallor or edema. Extraocular movements in vertical and horizontal planes intact , he has a lower eyelid tic- opsiclonus? Visual fields by finger perimetry are intact. Hearing to finger rub intact. Facial weakness as residual of old bell's palsy. Sensation intact to fine touch. Facial ,tongue and uvula move midline.  Motor exam: Normal tone and normal muscle bulk and symmetric normal strength in all extremities.  Sensory: Fine touch, pinprick and vibration were tested in all extremities.  Proprioception is tested in the upper extremities only. This was normal.  Coordination: Rapid alternating movements in the fingers/hands is tested and normal. Finger-to-nose maneuver tested and normal without evidence of ataxia, dysmetria or tremor.  Gait and station: Patient walks without assistive device and is able and assisted stool climb up to the exam table.  Strength within normal limits. Stance is stable and normal. Tandem gait is normal .  Deep tendon reflexes: in the upper and lower extremities are symmetric and intact.     Assessment #1 sleep apnea is under good control with the current CPAP setting. No change is necessary.  Assessment #2  insomnia is  partially improved under the use of trazodone.  Will reduce trazodone as it may affect his verbal memory.  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   Assessment #3 headache was much improved for a  while - the patient was sick with pneumonia and not at work.!  He now has returned and so have the headaches. Flushed, eye lid tic,  associated with a dilated left pupil that appears to have a slowed light reaction. Left sided Bells palsy history with a little residual weakness  and crocodile tears.  Depakote and trazodone not longer working, zanaflex ( tizanidine) .  His left eye appears to protrude versus the left eye sunken .  We will try today indomethacin capsules twice a day after food intake. I told also that  Trazodone  change to a [place] , he will not experience any worsening of his sleep apnea as a side effect of the sleep aid.  My plan for the headaches is to start the patient on lithium as a PLAN B,  should indomethacin fail.

## 2013-06-27 ENCOUNTER — Other Ambulatory Visit: Payer: Self-pay | Admitting: Neurology

## 2013-06-27 NOTE — Telephone Encounter (Signed)
OV note form 04/21 says: Depakote and trazodone not longer working, zanaflex ( tizanidine) .  His left eye appears to protrude versus the left eye sunken .  We will try today indomethacin capsules twice a day after food intake. I told also that Trazodone change to a [place]

## 2013-06-28 ENCOUNTER — Telehealth: Payer: Self-pay | Admitting: Neurology

## 2013-06-28 NOTE — Telephone Encounter (Signed)
Pt calling stating that the Belsomra 15 mg only gave 2 hrs max relief and Indocin 50 mg causing skin agitations, nerves real bad and pt would like Dr. Brett Fairy to give him a call back please. Thanks

## 2013-06-28 NOTE — Telephone Encounter (Signed)
Left VM stop indocin and start Belsomra at 20 mg po, see for 2 days, if not response.

## 2013-06-28 NOTE — Telephone Encounter (Signed)
Pt called states that the Suvorexant (BELSOMRA) 15 MG TABS only got 2 hours max with it, & indomethacin (INDOCIN) 50 MG capsule causing skin agitations, nerves real bad. Pt would like for someone to call him back concerning this matter. Thanks

## 2013-06-29 NOTE — Telephone Encounter (Signed)
Patient called stating he wants something prescribed for his strong headaches.  Says he has nothing to take that alleviates them.  Please advise.  Thank you.

## 2013-06-29 NOTE — Telephone Encounter (Signed)
i will give him another 10 days of samples for 20 mg dose. Belsomra.  He may return to trazodone if that remains ineffective.  I will refer him to a headche specialist , I am running out of options.  Lithium is used in some HA types, that is all I can think of now-  i will se if dr Janann Colonel is willing to set a new look on this very nice patient.

## 2013-06-29 NOTE — Telephone Encounter (Signed)
I tried to call patient back on both cell and home number.  Got no answer on either line.

## 2013-06-29 NOTE — Telephone Encounter (Signed)
Pt called back states Pharmacy called and that [facility] rejected the sleep medication Suvorexant (BELSOMRA) 15 MG TABS. Pt states they recommended generic Lunesta, Ambien or Sonata. Please call pt concerning this and also on the headache medication. Thanks

## 2013-06-29 NOTE — Telephone Encounter (Signed)
Belsomra is not covered by the patients ins.  They require he try and fail Zolpidem, Sonata and Johnnye Sima (patient has tried [place]) before they will consider covering [place].  Would you like to change to a formulary alternative?   As well, the patient would like something prescribed for a headache.  Says nothing he has helps.  Please advise.  Thank you.

## 2013-06-29 NOTE — Telephone Encounter (Signed)
Pt called states he also needs something for his headaches, they are getting stronger and he has nothing to take. Please call pt when something is called in. Thanks

## 2013-06-30 MED ORDER — ZOLPIDEM TARTRATE 10 MG PO TABS: 10.0000 mg | ORAL_TABLET | Freq: Every evening | ORAL | Status: DC | PRN

## 2013-06-30 MED ORDER — ZOLPIDEM TARTRATE ER 12.5 MG PO TBCR: 12.5000 mg | EXTENDED_RELEASE_TABLET | Freq: Every evening | ORAL | Status: DC | PRN

## 2013-06-30 NOTE — Telephone Encounter (Signed)
Pt returning [place] call, please call pt on his cell phone. Thanks

## 2013-06-30 NOTE — Telephone Encounter (Signed)
Wrote for [employer] generic 12.5 mg . 30 days.

## 2013-06-30 NOTE — Telephone Encounter (Signed)
Unfortunately, his ins is very particular, and the 12.5mg  CR version is not covered.  They will only cover regular release 5mg  or 10mg .  Could the dose be changed so it will be covered under ins please?  Thank you.

## 2013-06-30 NOTE — Telephone Encounter (Signed)
Rx has been sent.  I called the patient back.  Got no answer.  Left message.  

## 2013-06-30 NOTE — Telephone Encounter (Signed)
I spoke with the patient.  He said he would prefer to try either generic Ambien (regular release) or generic Sonata.  He does not wish to continue with Belsomra or change Trazodone.  Please advise.  Thank you.   (He is agreeable to a referral to headache clinic.)

## 2013-06-30 NOTE — Telephone Encounter (Signed)
i will change it right now. 10 mg [employer]

## 2013-07-18 ENCOUNTER — Telehealth: Payer: Self-pay | Admitting: Neurology

## 2013-07-18 MED ORDER — ZOLPIDEM TARTRATE 10 MG PO TABS: 10.0000 mg | ORAL_TABLET | Freq: Every day | ORAL | Status: DC

## 2013-07-18 NOTE — Telephone Encounter (Signed)
Pt calling requesting increase dosage in Ambiem 10 mg because the medication is not keeping the pt sleep but for 3-4 hrs. Please advise

## 2013-07-18 NOTE — Telephone Encounter (Signed)
Patient calling to request dosage increase of zolpidem (AMBIEN) 10 MG tablet.  Medication helping him to fall asleep, but only for 3-4 hrs..Please call and advise

## 2013-08-14 ENCOUNTER — Telehealth: Payer: Self-pay

## 2013-08-14 NOTE — Telephone Encounter (Signed)
[employer] sent [place] a letter saying they have approved our request for coverage on Zolpidem effective until 07/25/2014 Ref # [id number]

## 2013-08-28 ENCOUNTER — Ambulatory Visit: Payer: 59 | Admitting: Nurse Practitioner

## 2013-08-28 ENCOUNTER — Telehealth: Payer: Self-pay | Admitting: Neurology

## 2013-08-28 MED ORDER — TRAZODONE HCL 150 MG PO TABS: 150.0000 mg | ORAL_TABLET | Freq: Every day | ORAL | Status: DC

## 2013-08-28 NOTE — Telephone Encounter (Signed)
i will have to put him back on trazodone.

## 2013-08-28 NOTE — Telephone Encounter (Signed)
Patient would like to know if he has to wean down on Ambien or can he just stop taking it and start the Trazodone.

## 2013-08-28 NOTE — Telephone Encounter (Signed)
Spoke to patient and since he has been off Trazodone and started Ambien he is having a high level of anxiety.  When he tries to relax his heart starts palpating and can't turn his mind off.   He is only sleeping about 3-4 hours a night.  This higher level of anxiety has been going on for a month.  He would like to know what he can do now, he expressed that this can't wait until his next appointment on 7-30.15.

## 2013-08-29 NOTE — Telephone Encounter (Signed)
Spoke to doctor and relayed to patient he should wean down on Ambien to 5 mg for 2 weeks then go to 5 mg every other day and then stop.  The doctor suggested taking Tylenol PM.

## 2013-08-31 NOTE — Telephone Encounter (Signed)
Patient wanted to know if it was Okay to take Benadryl with the Ambien.  I spoke to doctor who relayed it may cause a "hangover effect" the next morning, but all right to take.  I relayed this information to patient.

## 2013-09-21 ENCOUNTER — Telehealth: Payer: Self-pay | Admitting: *Deleted

## 2013-09-21 NOTE — Telephone Encounter (Signed)
Called patient to r/s appointment on 07/30 with NP CM, Dohmeier patient never been seen by NP CM per Epic and [facility], patient was r/s to 09/22/13 at 9 am with NP MM

## 2013-09-22 ENCOUNTER — Encounter: Payer: Self-pay | Admitting: Adult Health

## 2013-09-22 ENCOUNTER — Ambulatory Visit (INDEPENDENT_AMBULATORY_CARE_PROVIDER_SITE_OTHER): Payer: 59 | Admitting: Adult Health

## 2013-09-22 VITALS — BP 128/82 | HR 77 | Ht 71.25 in | Wt 174.0 lb

## 2013-09-22 DIAGNOSIS — G4733 Obstructive sleep apnea (adult) (pediatric): Secondary | ICD-10-CM

## 2013-09-22 DIAGNOSIS — G4700 Insomnia, unspecified: Secondary | ICD-10-CM

## 2013-09-22 MED ORDER — SUMATRIPTAN SUCCINATE 50 MG PO TABS: 50.0000 mg | ORAL_TABLET | Freq: Once | ORAL | Status: DC

## 2013-09-22 NOTE — Patient Instructions (Signed)
Sumatriptan tablets What is this medicine? SUMATRIPTAN (soo ma TRIP tan) is used to treat migraines with or without aura. An aura is a strange feeling or visual disturbance that warns you of an attack. It is not used to prevent migraines. This medicine may be used for other purposes; ask your health care provider or pharmacist if you have questions. COMMON BRAND NAME(S): Imitrex What should I tell my health care provider before I take this medicine? They need to know if you have any of these conditions: -bowel disease or colitis -diabetes -family history of heart disease -fast or irregular heart beat -heart or blood vessel disease, angina (chest pain), or previous heart attack -high blood pressure -high cholesterol -history of stroke, transient ischemic attacks (TIAs or mini-strokes), or intracranial bleeding -kidney or liver disease -overweight -poor circulation -postmenopausal or surgical removal of uterus and ovaries -Raynaud's disease -seizure disorder -an unusual or allergic reaction to sumatriptan, other medicines, foods, dyes, or preservatives -pregnant or trying to get pregnant -breast-feeding How should I use this medicine? Take this medicine by mouth with a glass of water. Follow the directions on the prescription label. This medicine is taken at the first symptoms of a migraine. It is not for everyday use. If your migraine headache returns after one dose, you can take another dose as directed. You must leave at least 2 hours between doses, and do not take more than 100 mg as a single dose. Do not take more than 200 mg total in any 24 hour period. If there is no improvement at all after the first dose, do not take a second dose without talking to your doctor or health care professional. Do not take your medicine more often than directed. Talk to your pediatrician regarding the use of this medicine in children. Special care may be needed. Overdosage: If you think you have taken  too much of this medicine contact a poison control center or emergency room at once. NOTE: This medicine is only for you. Do not share this medicine with others. What if I miss a dose? This does not apply; this medicine is not for regular use. What may interact with this medicine? Do not take this medicine with any of the following medicines: -amphetamine or cocaine -dihydroergotamine, ergotamine, ergoloid mesylates, methysergide, or ergot-type medication - do not take within 24 hours of taking sumatriptan -feverfew -MAOIs like Carbex, Eldepryl, Marplan, Nardil, and Parnate - do not take sumatriptan within 2 weeks of stopping MAOI therapy -other migraine medicines like almotriptan, eletriptan, naratriptan, rizatriptan, zolmitriptan - do not take within 24 hours of taking sumatriptan -tryptophan This medicine may also interact with the following medications: -lithium -medicines for mental depression, anxiety or mood problems -medicines for weight loss such as dexfenfluramine, dextroamphetamine, fenfluramine, or sibutramine -St. John's wort This list may not describe all possible interactions. Give your health care provider a list of all the medicines, herbs, non-prescription drugs, or dietary supplements you use. Also tell them if you smoke, drink alcohol, or use illegal drugs. Some items may interact with your medicine. What should I watch for while using this medicine? Only take this medicine for a migraine headache. Take it if you get warning symptoms or at the start of a migraine attack. It is not for regular use to prevent migraine attacks. You may get drowsy or dizzy. Do not drive, use machinery, or do anything that needs mental alertness until you know how this medicine affects you. To reduce dizzy or fainting spells, do not  sit or stand up quickly, especially if you are an older patient. Alcohol can increase drowsiness, dizziness and flushing. Avoid alcoholic drinks. Smoking cigarettes  may increase the risk of heart-related side effects from using this medicine. If you take migraine medicines for 10 or more days a month, your migraines may get worse. Keep a diary of headache days and medicine use. Contact your healthcare professional if your migraine attacks occur more frequently. What side effects may I notice from receiving this medicine? Side effects that you should report to your doctor or health care professional as soon as possible: -allergic reactions like skin rash, itching or hives, swelling of the face, lips, or tongue -fast, slow, or irregular heart beat -hallucinations -increased or decreased blood pressure -seizures -severe stomach pain and cramping, bloody diarrhea -signs and symptoms of a blood clot such as breathing problems; changes in vision; chest pain; severe, sudden headache; pain, swelling, warmth in the leg; trouble speaking; sudden numbness or weakness of the face, arm or leg -tingling, pain, or numbness in the face, hands or feet Side effects that usually do not require medical attention (report to your doctor or health care professional if they continue or are bothersome): -drowsiness -feeling warm, flushing, or redness of the face -headache -muscle cramps, pain -nausea, vomiting -unusually weak or tired This list may not describe all possible side effects. Call your doctor for medical advice about side effects. You may report side effects to FDA at 1-800-FDA-1088. Where should I keep my medicine? Keep out of the reach of children. Store at room temperature between 2 and 30 degrees C (36 and 86 degrees F). Throw away any unused medicine after the expiration date. NOTE: This sheet is a summary. It may not cover all possible information. If you have questions about this medicine, talk to your doctor, pharmacist, or health care provider.  2015, Elsevier/Gold Standard. (2012-10-18 10:12:47) Serotonin Syndrome Serotonin is a brain chemical that  regulates the nervous system. Some kinds of drugs increase the amount of serotonin in your body. Drugs that increase the serotonin in your body include:   Anti-depressant medications.  St. John's wort.  Recreational drugs.  Migraine medicines.  Some pain medicines. SYMPTOMS Combining these drugs increases the risk that you will become ill with a toxic condition called serotonin syndrome.  Symptoms of too much serotonin include:  Confusion.  Agitation.  Weakness.  Insomnia.  Fever.  Sweats. Other symptoms that may develop include:  Shakiness.  Muscle spasms.  Seizures. TREATMENT  Hospital treatment is often needed until the effects are controlled.  Avoiding the combination of medicines listed above is recommended.  Check with your doctor if you are concerned about your medicine or the side effects. Document Released: 03/26/2004 Document Revised: 05/11/2011 Document Reviewed: 02/16/2005 [facility] Patient Information 2015 [place], [place]. This information is not intended to replace advice given to you by your health care provider. Make sure you discuss any questions you have with your health care provider.

## 2013-09-22 NOTE — Progress Notes (Signed)
I agree with the assessment and plan as directed by NP .The patient is known to me .   Willem Klingensmith, MD  

## 2013-09-22 NOTE — Progress Notes (Signed)
PATIENT: Julian Oneal DOB: 03/05/1965  REASON FOR VISIT: follow up HISTORY FROM: patient  HISTORY OF PRESENT ILLNESS: Julian Oneal is a 48 year old male with a history of OSA on CPAP and headaches. He returns today for follow-up.patient is currently taking trazadone and that is working well for him. Patient reports that he gets about 4 hours of sleep a night. He goes to bed around 10 pm  and wakes up around 3 am. Patient states that he has not noticed an improvement in his memory. He states he really noticed this once he was started on Topamax with Dr. Domingo Cocking, he has since then stopped this medication . Patient uses the CPAP nightly but doesn't really notice a change in his sleepiness and fatigue. Patient reports that his headaches come and go. He states that he will go weeks with no headaches then have several in a row. He describes his headaches as sharp pain behind the eye or on one side of his face. He has tried multiple medications in the past with no success. He has been seen at a headache clinic before.   REVIEW OF SYSTEMS: Full 14 system review of systems performed and notable only for:  Constitutional: N/A  Eyes: N/A Ear/Nose/Throat: N/A  Skin: N/A  Cardiovascular: N/A  Respiratory: N/A  Gastrointestinal: N/A  Genitourinary: N/A Hematology/Lymphatic: N/A  Endocrine: N/A Musculoskeletal:N/A  Allergy/Immunology: N/A  Neurological: memory loss, headache Psychiatric: N/A Sleep: insomnia   ALLERGIES: Allergies  Allergen Reactions  . Amitriptyline     headaches  . Silenor [Doxepin Hcl]     headaches  . Tribenzor [Olmesartan-Amlodipine-Hctz]     headaches  . Topiramate     Severe headaches, memory loss    HOME MEDICATIONS: Outpatient Prescriptions Prior to Visit  Medication Sig Dispense Refill  . ALBUTEROL IN Inhale into the lungs daily. As needed      . Cyanocobalamin (VITAMIN B 12 PO) Take 1,000 mcg by mouth. One tablet sublingual once daily      . fenofibrate  micronized (ANTARA) 130 MG capsule Take 160 mg by mouth daily.       Marland Kitchen FOLIC ACID PO Take 373 mcg by mouth. One tablet daily      . hydrochlorothiazide (HYDRODIURIL) 25 MG tablet Take 25 mg by mouth daily.      Marland Kitchen lisinopril (PRINIVIL,ZESTRIL) 10 MG tablet Take 10 mg by mouth daily.      . Omega-3 Fatty Acids (FISH OIL) 1000 MG CAPS Take by mouth. One tablet three times daily      . tadalafil (CIALIS) 20 MG tablet Take 20 mg by mouth daily as needed for erectile dysfunction. One tablet daily      . traZODone (DESYREL) 150 MG tablet Take 1 tablet (150 mg total) by mouth at bedtime.  30 tablet  3  . divalproex (DEPAKOTE ER) 500 MG 24 hr tablet       . indomethacin (INDOCIN) 50 MG capsule Take 1 capsule (50 mg total) by mouth 2 (two) times daily with a meal.  30 capsule  3  . Suvorexant (BELSOMRA) 15 MG TABS Take 15 mg by mouth as directed. 30 minutes before bedtime. Samples given .  10 tablet  0  . tiZANidine (ZANAFLEX) 4 MG tablet Take 1 tablet (4 mg total) by mouth at bedtime.  30 tablet  5  . VENTOLIN HFA 108 (90 BASE) MCG/ACT inhaler       . zolpidem (AMBIEN) 10 MG tablet Take 1 tablet (  10 mg total) by mouth at bedtime. 1 and 1/2 tab prn at bedtime.  45 tablet  2   No facility-administered medications prior to visit.    PAST MEDICAL HISTORY: Past Medical History  Diagnosis Date  . Bell's palsy 2008    Dr Jannifer [place]  . Insomnia   . Taste absent   . Hypertension   . High cholesterol   . Headache(784.0)   . OSA on CPAP     PAST SURGICAL HISTORY: Past Surgical History  Procedure Laterality Date  . Hernia repair  [id number]  . Mouth surgery    . Foot surgery  2008    FAMILY HISTORY: Family History  Problem Relation Age of Onset  . Cancer - Prostate Father   . Parkinson's disease Maternal Uncle   . Alzheimer's disease Paternal Aunt     in their late 26's  . Alzheimer's disease Paternal Uncle     in their late 26's  . Cancer - Prostate Maternal Grandfather     SOCIAL  HISTORY: History   Social History  . Marital Status: Married    Spouse Name: N/A    Number of Children: 0  . 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     Social History Main Topics  . Smoking status: Former Smoker    Start date: 03/02/1990  . Smokeless tobacco: Former Systems developer    Quit date: 03/02/1990  . Alcohol Use: Yes     Comment: occasionally  . Drug Use: No  . Sexual Activity: Not on file   Other Topics Concern  . 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 pt denies any illegal drugs, tobacco use(quit in 1992), consumes alcohol occasionally and consumes coffee.      PHYSICAL EXAM  Filed Vitals:   09/22/13 0843  BP: 128/82  Pulse: 77  Height: 5' 11.25" (1.81 m)  Weight: 174 lb (78.926 kg)   Body mass index is 24.09 kg/(m^2).  Generalized: Well developed, in no acute distress   Neurological examination  Mentation: Alert oriented to time, place, history taking. Follows all commands speech and language fluent Cranial nerve II-XII:  Extraocular movements were full, visual field were full on confrontational test.  Motor: The motor testing reveals 5 over 5 strength of all 4 extremities. Good symmetric motor tone is noted throughout.  Sensory: Sensory testing is intact to soft touch on all 4 extremities. No evidence of extinction is noted.  Coordination: Cerebellar testing reveals good finger-nose-finger and heel-to-shin bilaterally.  Gait and station: Gait is normal. Tandem gait is normal. Romberg is negative. No drift is seen.  Reflexes: Deep tendon reflexes are symmetric and normal bilaterally.    DIAGNOSTIC DATA (LABS, IMAGING, TESTING) - I reviewed patient records, labs, notes, testing and imaging myself where available.  Lab Results  Component Value Date   HGB 15.1  02/08/2008      Component Value Date/Time   NA 138 02/06/2008 1045   K 3.9 02/06/2008 1045   CL 103 02/06/2008 1045   CO2 30 02/06/2008 1045   GLUCOSE 93 02/06/2008 1045   BUN 9 02/06/2008 1045   CREATININE 0.96 02/06/2008 1045   CALCIUM 9.6 02/06/2008 1045   GFRNONAA >60 02/06/2008 1045   GFRAA  Value: >60  The eGFR has been calculated using the MDRD equation. This calculation has not been validated in all clinical 02/06/2008 [place] 48 y.o. year old male  has a past medical history of Bell's palsy (2008); Insomnia; Taste absent; Hypertension; High cholesterol; Headache(784.0); and OSA on CPAP. here with   1. OSA on CPAP 2. Headache  Patient did not bring his card with him today. We will do a download at the next visit. Patient states that he uses his CPAP nightly however he does not notice the benefit do to chronic insomnia. Patient currently takes trazodone at night. He states that he only takes half a tablet. So far this has been the only medication that he's been able to tolerate and that he has found beneficial. Patient states that his headaches come and go. He will go weeks without a headache and then have several headaches back to back. This may be representative of a cluster headache. He is also tried several medications but so far none have been beneficial. I will try the patient on Imitrex. I explained the risk and benefits of this drug. The patient does have high blood pressure but it is controlled with medication. I also explained that trazodone and Imitrex could cause a serotonin syndrome. I explained the signs and symptoms of serotonin syndrome. The patient is to only take this drug at the beginning of a headache and can repeat 2 hours later if the headache has not resolved. This medication is not to be taken daily. If it does not resolve the headache at the onset the patient should not continue to take the medication. Patient verbalized understanding. The  patient should followup in 6 months or sooner if needed. At the next visit he should bring his CPAP machine.     Ward Givens, MSN, NP-C 09/22/2013, 8:50 AM [facility] [address], [place], [place] [address] [phone]  Note: This document was prepared with digital dictation and possible smart phrase technology. Any transcriptional errors that result from this process are unintentional.

## 2013-09-28 ENCOUNTER — Ambulatory Visit: Payer: 59 | Admitting: Nurse Practitioner

## 2013-09-28 NOTE — Telephone Encounter (Signed)
Confirmed his HA are better, his insomnia slightly better. Follow up with Hedwig Morton.

## 2013-12-15 ENCOUNTER — Other Ambulatory Visit: Payer: Self-pay

## 2014-02-23 ENCOUNTER — Other Ambulatory Visit: Payer: Self-pay | Admitting: Neurology

## 2014-02-24 NOTE — Telephone Encounter (Signed)
Prescribed by Dr D on 06/29

## 2014-03-06 ENCOUNTER — Other Ambulatory Visit: Payer: Self-pay | Admitting: Neurology

## 2014-03-06 NOTE — Telephone Encounter (Signed)
Prescribed by Dr D on 06/29

## 2014-03-23 ENCOUNTER — Other Ambulatory Visit (HOSPITAL_COMMUNITY): Payer: Self-pay

## 2014-03-24 ENCOUNTER — Emergency Department (HOSPITAL_COMMUNITY)
Admission: EM | Admit: 2014-03-24 | Discharge: 2014-03-24 | Disposition: A | Discharge status: Home / Self Care | Payer: 59 | Attending: Emergency Medicine | Admitting: Emergency Medicine

## 2014-03-24 ENCOUNTER — Emergency Department (HOSPITAL_COMMUNITY): Payer: 59

## 2014-03-24 ENCOUNTER — Encounter (HOSPITAL_COMMUNITY): Payer: Self-pay | Admitting: Emergency Medicine

## 2014-03-24 DIAGNOSIS — Y998 Other external cause status: Secondary | ICD-10-CM | POA: Insufficient documentation

## 2014-03-24 DIAGNOSIS — Y9389 Activity, other specified: Secondary | ICD-10-CM | POA: Insufficient documentation

## 2014-03-24 DIAGNOSIS — I10 Essential (primary) hypertension: Secondary | ICD-10-CM | POA: Insufficient documentation

## 2014-03-24 DIAGNOSIS — Z23 Encounter for immunization: Secondary | ICD-10-CM | POA: Diagnosis not present

## 2014-03-24 DIAGNOSIS — Y9289 Other specified places as the place of occurrence of the external cause: Secondary | ICD-10-CM | POA: Insufficient documentation

## 2014-03-24 DIAGNOSIS — S67193A Crushing injury of left middle finger, initial encounter: Secondary | ICD-10-CM | POA: Diagnosis present

## 2014-03-24 DIAGNOSIS — W231XXA Caught, crushed, jammed, or pinched between stationary objects, initial encounter: Secondary | ICD-10-CM | POA: Diagnosis not present

## 2014-03-24 DIAGNOSIS — S60132A Contusion of left middle finger with damage to nail, initial encounter: Secondary | ICD-10-CM

## 2014-03-24 DIAGNOSIS — E780 Pure hypercholesterolemia: Secondary | ICD-10-CM | POA: Diagnosis not present

## 2014-03-24 DIAGNOSIS — Z9981 Dependence on supplemental oxygen: Secondary | ICD-10-CM | POA: Insufficient documentation

## 2014-03-24 DIAGNOSIS — Z79899 Other long term (current) drug therapy: Secondary | ICD-10-CM | POA: Insufficient documentation

## 2014-03-24 DIAGNOSIS — G4700 Insomnia, unspecified: Secondary | ICD-10-CM | POA: Insufficient documentation

## 2014-03-24 DIAGNOSIS — G4733 Obstructive sleep apnea (adult) (pediatric): Secondary | ICD-10-CM | POA: Insufficient documentation

## 2014-03-24 DIAGNOSIS — S60032A Contusion of left middle finger without damage to nail, initial encounter: Secondary | ICD-10-CM | POA: Insufficient documentation

## 2014-03-24 DIAGNOSIS — Z87891 Personal history of nicotine dependence: Secondary | ICD-10-CM | POA: Insufficient documentation

## 2014-03-24 DIAGNOSIS — S61213A Laceration without foreign body of left middle finger without damage to nail, initial encounter: Secondary | ICD-10-CM | POA: Diagnosis not present

## 2014-03-24 MED ORDER — OXYCODONE-ACETAMINOPHEN 5-325 MG PO TABS: 1.0000 | ORAL_TABLET | ORAL | Status: DC | PRN

## 2014-03-24 MED ORDER — LIDOCAINE HCL 1 % IJ SOLN
30.0000 mL | Freq: Once | INTRAMUSCULAR | Status: AC
  Administered 2014-03-24: 30 mL
  Filled 2014-03-24: qty 40

## 2014-03-24 MED ORDER — TETANUS-DIPHTH-ACELL PERTUSSIS 5-2.5-18.5 LF-MCG/0.5 IM SUSP
0.5000 mL | Freq: Once | INTRAMUSCULAR | Status: AC
  Administered 2014-03-24: 0.5 mL via INTRAMUSCULAR
  Filled 2014-03-24: qty 0.5

## 2014-03-24 MED ORDER — OXYCODONE-ACETAMINOPHEN 5-325 MG PO TABS
2.0000 | ORAL_TABLET | Freq: Once | ORAL | Status: AC
  Administered 2014-03-24: 2 via ORAL
  Filled 2014-03-24: qty 2

## 2014-03-24 NOTE — ED Notes (Signed)
Per pt, states he smashed left middle finger between wood stove and pile of wood-bleeding

## 2014-03-24 NOTE — ED Notes (Signed)
He has near-avulsion-type lac. At ant. Left middle and distal phalanx of finger # 3. The entire finger-pad area appears to have been flapped up and is now back down, and the finger is minimally bleeding at present.  He is calm and in no distress.

## 2014-03-24 NOTE — ED Provider Notes (Signed)
CSN: [id number]     Arrival date & time 03/24/14  0846 History   First MD Initiated Contact with Patient 03/24/14 [phone]     Chief Complaint  Patient presents with  . Finger Injury     (Consider location/radiation/quality/duration/timing/severity/associated sxs/prior Treatment) HPI  49 year old male presents after crushing his left middle finger. He was putting wood in an outside stove with gloves on and thinks it got crushed between the wood and metal stove. Had bleeding that has been controlled with pressure. Unsure of his last tetanus shot. He can move his finger but states that the distal portion of his finger feels "dead".  Past Medical History  Diagnosis Date  . Bell's palsy 2008    Dr Jannifer Franklin  . Insomnia   . Taste absent   . Hypertension   . High cholesterol   . Headache(784.0)   . OSA on CPAP    Past Surgical History  Procedure Laterality Date  . Hernia repair  [id number]  . Mouth surgery    . Foot surgery  2008   Family History  Problem Relation Age of Onset  . Cancer - Prostate Father   . Parkinson's disease Maternal Uncle   . Alzheimer's disease Paternal Aunt     in their late 14's  . Alzheimer's disease Paternal Uncle     in their late 62's  . Cancer - Prostate Maternal Grandfather    History  Substance Use Topics  . Smoking status: Former Smoker    Start date: 03/02/1990  . Smokeless tobacco: Former Systems developer    Quit date: 03/02/1990  . Alcohol Use: Yes     Comment: occasionally    Review of Systems  Musculoskeletal: Positive for joint swelling.  Skin: Positive for wound.  Neurological: Positive for numbness. Negative for weakness.  All other systems reviewed and are negative.     Allergies  Amitriptyline; Silenor; Tribenzor; and Topiramate  Home Medications   Prior to Admission medications   Medication Sig Start Date End Date Taking? Authorizing Provider  ALBUTEROL IN Inhale into the lungs daily. As needed    Historical Provider, MD    Cyanocobalamin (VITAMIN B 12 PO) Take 1,000 mcg by mouth. One tablet sublingual once daily    Historical Provider, MD  fenofibrate micronized (ANTARA) 130 MG capsule Take 160 mg by mouth daily.  06/20/12   Historical Provider, MD  FOLIC ACID PO Take 270 mcg by mouth. One tablet daily    Historical Provider, MD  hydrochlorothiazide (HYDRODIURIL) 25 MG tablet Take 25 mg by mouth daily.    Historical Provider, MD  lisinopril (PRINIVIL,ZESTRIL) 10 MG tablet Take 10 mg by mouth daily.    Historical Provider, MD  Omega-3 Fatty Acids (FISH OIL) 1000 MG CAPS Take by mouth. One tablet three times daily    Historical Provider, MD  SUMAtriptan (IMITREX) 50 MG tablet Take 1 tablet (50 mg total) by mouth once. May repeat in 2 hours if headache persists or recurs. 09/22/13   Ward Givens, NP  tadalafil (CIALIS) 20 MG tablet Take 20 mg by mouth daily as needed for erectile dysfunction. One tablet daily    Historical Provider, MD  traZODone (DESYREL) 150 MG tablet TAKE 1 TABLET (150 MG TOTAL) BY MOUTH AT BEDTIME. 02/24/14   Larey Seat, MD  traZODone (DESYREL) 150 MG tablet TAKE 1 TABLET (150 MG TOTAL) BY MOUTH AT BEDTIME. 03/06/14   Carmen Dohmeier, MD   BP 139/109 mmHg  Pulse 99  Temp(Src) 98.1 F (36.7  C) (Oral)  Resp 18  SpO2 100% Physical Exam  Constitutional: He is oriented to person, place, and time. He appears well-developed and well-nourished.  HENT:  Head: Normocephalic and atraumatic.  Cardiovascular: Normal rate and intact distal pulses.   Pulmonary/Chest: Effort normal.  Abdominal: He exhibits no distension.  Musculoskeletal: He exhibits no edema.       Left hand: He exhibits tenderness, bony tenderness and swelling. Decreased sensation noted.       Hands: Neurological: He is alert and oriented to person, place, and time.  Skin: Skin is warm and dry.  Nursing note and vitals reviewed.       ED Course  NERVE BLOCK Date/Time: 03/24/2014 10:44 AM Performed by: Sherwood Gambler  T Authorized by: Sherwood Gambler T Consent: Verbal consent obtained. Risks and benefits: risks, benefits and alternatives were discussed Consent given by: patient Required items: required blood products, implants, devices, and special equipment available Patient identity confirmed: verbally with patient Time out: Immediately prior to procedure a "time out" was called to verify the correct patient, procedure, equipment, support staff and site/side marked as required. Indications: pain relief and wound distortion Body area: upper extremity Nerve: digital Laterality: left Patient sedated: no Preparation: Patient was prepped and draped in the usual sterile fashion. Patient position: sitting Needle gauge: 25 G Location technique: anatomical landmarks Local anesthetic: lidocaine 1% without epinephrine Anesthetic total: 5 ml Outcome: pain improved Patient tolerance: Patient tolerated the procedure well with no immediate complications  LACERATION REPAIR Date/Time: 03/24/2014 10:45 AM Performed by: Sherwood Gambler T Authorized by: Sherwood Gambler T Consent: Verbal consent obtained. Risks and benefits: risks, benefits and alternatives were discussed Consent given by: patient Required items: required blood products, implants, devices, and special equipment available Patient identity confirmed: verbally with patient Time out: Immediately prior to procedure a "time out" was called to verify the correct patient, procedure, equipment, support staff and site/side marked as required. Body area: upper extremity Location details: left long finger Laceration length: 3 cm Tendon involvement: none Nerve involvement: none Vascular damage: no Anesthesia: digital block Patient sedated: no Preparation: Patient was prepped and draped in the usual sterile fashion. Irrigation solution: saline Irrigation method: syringe Amount of cleaning: extensive Debridement: none Degree of undermining: none Skin  closure: 5-0 nylon Number of sutures: 3 Technique: simple Approximation: close Approximation difficulty: simple Dressing: 4x4 sterile gauze Patient tolerance: Patient tolerated the procedure well with no immediate complications     Subungual Hematoma Drainage procedure: Location: left middle ring finger Technique: Cautery Procedure: Patient's finger prepped and draped in usual fashion. Using cautery, hole made into mid-nail distal to lunula with immediate drainage of blood. Blood continued to be expressed. No immediate complications   (including critical care time) Labs Review Labs Reviewed - No data to display  Imaging Review Dg Finger Middle Left  03/24/2014   CLINICAL DATA:  Laceration to left third digit.  EXAM: LEFT MIDDLE FINGER 2+V  COMPARISON:  None.  FINDINGS: No fracture or dislocation of the third digit. There is soft tissue swelling of the distal aspect of the digit. No foreign body.  IMPRESSION: No fracture or dislocation.   Electronically Signed   By: Suzy Bouchard M.D.   On: 03/24/2014 09:30     EKG Interpretation None      MDM   Final diagnoses:  Crushing injury of left middle finger, initial encounter  Subungual hematoma of third finger of left hand, initial encounter    Patient has evidence of a crush injury.  He was wearing gloves and the gloves and acupuncture. He has small superficial lacerations as above but no deep lacerations. I believe is mostly skin tears. Small sutures were placed, bleeding controlled. He has no lacerations near his nail folds or in his nail. He does have evidence of a moderate subungual hematoma which was trephinated with cautery. At this point he is improved, discussed with Dr. Burney Gauze who will see him in the next 2-3 days. Discussed strict return precautions.    Ephraim Hamburger, MD 03/24/14 [phone]

## 2014-03-24 NOTE — Discharge Instructions (Signed)
Crush Injury, Fingers or Toes A crush injury to the fingers or toes means the tissues have been damaged by being squeezed (compressed). There will be bleeding into the tissues and swelling. Often, blood will collect under the skin. When this happens, the skin on the finger often dies and may slough off (shed) 1 week to 10 days later. Usually, new skin is growing underneath. If the injury has been too severe and the tissue does not survive, the damaged tissue may begin to turn black over several days.  Wounds which occur because of the crushing may be stitched (sutured) shut. However, crush injuries are more likely to become infected than other injuries.These wounds may not be closed as tightly as other types of cuts to prevent infection. Nails involved are often lost. These usually grow back over several weeks.  DIAGNOSIS X-rays may be taken to see if there is any injury to the bones. TREATMENT Broken bones (fractures) may be treated with splinting, depending on the fracture. Often, no treatment is required for fractures of the last bone in the fingers or toes. HOME CARE INSTRUCTIONS   The crushed part should be raised (elevated) above the heart or center of the chest as much as possible for the first several days or as directed. This helps with pain and lessens swelling. Less swelling increases the chances that the crushed part will survive.  Put ice on the injured area.  Put ice in a plastic bag.  Place a towel between your skin and the bag.  Leave the ice on for 15-20 minutes, 03-04 times a day for the first 2 days.  Only take over-the-counter or prescription medicines for pain, discomfort, or fever as directed by your caregiver.  Use your injured part only as directed.  Change your bandages (dressings) as directed.  Keep all follow-up appointments as directed by your caregiver. Not keeping your appointment could result in a chronic or permanent injury, pain, and disability. If there is  any problem keeping the appointment, you must call to reschedule. SEEK IMMEDIATE MEDICAL CARE IF:   There is redness, swelling, or increasing pain in the wound area.  Pus is coming from the wound.  You have a fever.  You notice a bad smell coming from the wound or dressing.  The edges of the wound do not stay together after the sutures have been removed.  You are unable to move the injured finger or toe. MAKE SURE YOU:   Understand these instructions.  Will watch your condition.  Will get help right away if you are not doing well or get worse. Document Released: 02/16/2005 Document Revised: 05/11/2011 Document Reviewed: 07/04/2010 [facility] Patient Information 2015 [place], [place]. This information is not intended to replace advice given to you by your health care provider. Make sure you discuss any questions you have with your health care provider.    Subungual Hematoma A subungual hematoma is a pocket of blood that collects under the fingernail or toenail. The pressure created by the blood under the nail can cause pain. CAUSES  A subungual hematoma occurs when an injury to the finger or toe causes a blood vessel beneath the nail to break. The injury can occur from a direct blow such as slamming a finger in a door. It can also occur from a repeated injury such as pressure on the foot in a shoe while running. A subungual hematoma is sometimes called runner's toe or tennis toe. SYMPTOMS   Blue or dark blue skin under  the nail.  Pain or throbbing in the injured area. DIAGNOSIS  Your caregiver can determine whether you have a subungual hematoma based on your history and a physical exam. If your caregiver thinks you might have a broken (fractured) bone, X-rays may be taken. TREATMENT  Hematomas usually go away on their own over time. Your caregiver may make a hole in the nail to drain the blood. Draining the blood is painless and usually provides significant relief from pain and  throbbing. The nail usually grows back normally after this procedure. In some cases, the nail may need to be removed. This is done if there is a cut under the nail that requires stitches (sutures). HOME CARE INSTRUCTIONS   Put ice on the injured area.  Put ice in a plastic bag.  Place a towel between your skin and the bag.  Leave the ice on for 15-20 minutes, 03-04 times a day for the first 1 to 2 days.  Elevate the injured area to help decrease pain and swelling.  If you were given a bandage, wear it for as long as directed by your caregiver.  If part of your nail falls off, trim the remaining nail gently. This prevents the nail from catching on something and causing further injury.  Only take over-the-counter or prescription medicines for pain, discomfort, or fever as directed by your caregiver. SEEK IMMEDIATE MEDICAL CARE IF:   You have redness or swelling around the nail.  You have yellowish-white fluid (pus) coming from the nail.  Your pain is not controlled with medicine.  You have a fever. MAKE SURE YOU:  Understand these instructions.  Will watch your condition.  Will get help right away if you are not doing well or get worse. Document Released: 02/14/2000 Document Revised: 05/11/2011 Document Reviewed: 02/04/2011 [facility] Patient Information 2015 [place], [place]. This information is not intended to replace advice given to you by your health care provider. Make sure you discuss any questions you have with your health care provider.

## 2014-03-26 ENCOUNTER — Other Ambulatory Visit (HOSPITAL_COMMUNITY): Payer: Self-pay

## 2014-03-28 ENCOUNTER — Encounter: Payer: Self-pay | Admitting: Neurology

## 2014-03-28 ENCOUNTER — Encounter: Payer: Self-pay | Admitting: Adult Health

## 2014-03-28 ENCOUNTER — Ambulatory Visit (INDEPENDENT_AMBULATORY_CARE_PROVIDER_SITE_OTHER): Payer: 59 | Admitting: Adult Health

## 2014-03-28 VITALS — BP 123/86 | HR 68 | Ht 71.0 in | Wt 184.0 lb

## 2014-03-28 DIAGNOSIS — R51 Headache: Secondary | ICD-10-CM

## 2014-03-28 DIAGNOSIS — R519 Headache, unspecified: Secondary | ICD-10-CM

## 2014-03-28 DIAGNOSIS — G4733 Obstructive sleep apnea (adult) (pediatric): Secondary | ICD-10-CM

## 2014-03-28 DIAGNOSIS — G4700 Insomnia, unspecified: Secondary | ICD-10-CM

## 2014-03-28 NOTE — Progress Notes (Signed)
I agree with the assessment and plan as directed by NP .The patient is known to me .   Whitten Andreoni, MD  

## 2014-03-28 NOTE — Patient Instructions (Signed)
CPAP download looks good.  Continue Trazodone.  In the future we will consider trying a sleeping agent in replace of Benadryl.  If your symptoms worsen or you develop new symptoms please let us know.

## 2014-03-28 NOTE — Progress Notes (Signed)
PATIENT: Sankalp Ferrell Frangos DOB: 01-06-1966  REASON FOR VISIT: follow up- OSA, insomnia, headache HISTORY FROM: patient  HISTORY OF PRESENT ILLNESS: Mr. Moser is a 49 year old male with a history of OSA on CPAP and headaches. He returns today for follow-up. He is currently using Imitrex for the headaches. He reports that he has not had to use the Imitrex yet. He states that his headaches have been pretty controlled. The patient is currently on trazodone and reports that he gets about 7 hours of sleep each night. He states that he cut his trazodone in half and has been taking 2 benadryl's each night. He states this combination has prolonged his sleep at night. His CPAP Download for 90 days shows 98 % compliance. He uses his machine on average for 6 hours and 53 minutes. His AHI is 0.9 on 6 cm H20. He still experiences daytime sleepiness. He states that some days he just feels drained. Epworth score 15. He recently was diagnosed with a hernia and has a consultation with a surgeon next week. He also recently smashed in finger while loading a wood stove. He is seeing a Copy Dr. Shelbie Hutching for this.   HISTORY 09/22/13: Mr. Lux is a 49 year old male with a history of OSA on CPAP and headaches. He returns today for follow-up.patient is currently taking trazadone and that is working well for him. Patient reports that he gets about 4 hours of sleep a night. He goes to bed around 10 pm  and wakes up around 3 am. Patient states that he has not noticed an improvement in his memory. He states he really noticed this once he was started on Topamax with Dr. Domingo Cocking, he has since then stopped this medication . Patient uses the CPAP nightly but doesn't really notice a change in his sleepiness and fatigue. Patient reports that his headaches come and go. He states that he will go weeks with no headaches then have several in a row. He describes his headaches as sharp pain behind the eye or on one side of his face. He has  tried multiple medications in the past with no success. He has been seen at a headache clinic before.  REVIEW OF SYSTEMS: Out of a complete 14 system review of symptoms, the patient complains only of the following symptoms, and all other reviewed systems are negative.   ALLERGIES: Allergies  Allergen Reactions  . Amitriptyline     headaches  . Silenor [Doxepin Hcl]     headaches  . Tribenzor [Olmesartan-Amlodipine-Hctz]     headaches  . Topiramate     Severe headaches, memory loss    HOME MEDICATIONS: Outpatient Prescriptions Prior to Visit  Medication Sig Dispense Refill  . albuterol (PROVENTIL HFA;VENTOLIN HFA) 108 (90 BASE) MCG/ACT inhaler Inhale 2 puffs into the lungs every 6 (six) hours as needed for wheezing or shortness of breath.    . diphenhydrAMINE (BENADRYL) 25 MG tablet Take 50 mg by mouth at bedtime.    . fenofibrate 160 MG tablet Take 160 mg by mouth daily.    . Fluticasone Furoate-Vilanterol 100-25 MCG/INH AEPB Inhale 1 puff into the lungs daily.    . folic acid (FOLVITE) 233 MCG tablet Take 400 mcg by mouth daily.    . hydrochlorothiazide (HYDRODIURIL) 25 MG tablet Take 25 mg by mouth daily.    Marland Kitchen lisinopril (PRINIVIL,ZESTRIL) 10 MG tablet Take 10 mg by mouth daily.    . Omega-3 Fatty Acids (FISH OIL) 1000  MG CAPS Take 1,000 mg by mouth 3 (three) times daily. One tablet three times daily    . oxyCODONE-acetaminophen (PERCOCET) 5-325 MG per tablet Take 1-2 tablets by mouth every 4 (four) hours as needed for severe pain. 20 tablet 0  . pseudoephedrine-acetaminophen (TYLENOL SINUS) 30-500 MG TABS Take 2 tablets by mouth every 4 (four) hours as needed (cough).    . SUMAtriptan (IMITREX) 50 MG tablet Take 1 tablet (50 mg total) by mouth once. May repeat in 2 hours if headache persists or recurs. 10 tablet 0  . tadalafil (CIALIS) 20 MG tablet Take 20 mg by mouth daily as needed for erectile dysfunction. One tablet daily    . traMADol-acetaminophen (ULTRACET) 37.5-325 MG per  tablet Take 1 tablet by mouth every 6 (six) hours as needed for moderate pain.    . traZODone (DESYREL) 150 MG tablet TAKE 1 TABLET (150 MG TOTAL) BY MOUTH AT BEDTIME. 30 tablet 3  . traZODone (DESYREL) 150 MG tablet TAKE 1 TABLET (150 MG TOTAL) BY MOUTH AT BEDTIME. 30 tablet 0   No facility-administered medications prior to visit.    PAST MEDICAL HISTORY: Past Medical History  Diagnosis Date  . Bell's palsy 2008    Dr Jannifer Franklin  . Insomnia   . Taste absent   . Hypertension   . High cholesterol   . Headache(784.0)   . OSA on CPAP     PAST SURGICAL HISTORY: Past Surgical History  Procedure Laterality Date  . Hernia repair  [id number]  . Mouth surgery    . Foot surgery  2008    FAMILY HISTORY: Family History  Problem Relation Age of Onset  . Cancer - Prostate Father   . Parkinson's disease Maternal Uncle   . Alzheimer's disease Paternal Aunt     in their late 88's  . Alzheimer's disease Paternal Uncle     in their late 39's  . Cancer - Prostate Maternal Grandfather     SOCIAL HISTORY: History   Social History  . Marital Status: Married    Spouse Name: N/A    Number of Children: 0  . 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     Social History Main Topics  . Smoking status: Former Smoker    Start date: 03/02/1990  . Smokeless tobacco: Former Systems developer    Quit date: 03/02/1990  . Alcohol Use: Yes     Comment: occasionally  . Drug Use: No  . Sexual Activity: Not on file   Other Topics Concern  . 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 pt denies any illegal drugs, tobacco use(quit in 1992), consumes alcohol occasionally and consumes coffee.      PHYSICAL EXAM  Filed Vitals:   03/28/14 0808  BP: 123/86  Pulse: 68  Height: '5\' 11"'  (1.803 m)  Weight: 184 lb  (83.462 kg)   Body mass index is 25.67 kg/(m^2).  Generalized: Well developed, in no acute distress   Neurological examination  Mentation: Alert oriented to time, place, history taking. Follows all commands speech and language fluent Cranial nerve II-XII: Pupils were equal round reactive to light. Extraocular movements were full, visual field were full on confrontational test. Facial sensation and strength were normal. Uvula tongue midline. Head turning and shoulder shrug  were normal and  symmetric. Motor: The motor testing reveals 5 over 5 strength of all 4 extremities. Good symmetric motor tone is noted throughout.  Sensory: Sensory testing is intact to soft touch on all 4 extremities. No evidence of extinction is noted.  Coordination: Cerebellar testing reveals good finger-nose-finger and heel-to-shin bilaterally.  Gait and station: Gait is normal. Tandem gait is normal. Romberg is negative. No drift is seen.  Reflexes: Deep tendon reflexes are symmetric and normal bilaterally.   DIAGNOSTIC DATA (LABS, IMAGING, TESTING) - I reviewed patient records, labs, notes, testing and imaging myself where available.  Lab Results  Component Value Date   HGB 15.1 02/08/2008      Component Value Date/Time   NA 138 02/06/2008 1045   K 3.9 02/06/2008 1045   CL 103 02/06/2008 1045   CO2 30 02/06/2008 1045   GLUCOSE 93 02/06/2008 1045   BUN 9 02/06/2008 1045   CREATININE 0.96 02/06/2008 1045   CALCIUM 9.6 02/06/2008 1045   GFRNONAA >60 02/06/2008 1045   GFRAA  02/06/2008 1045    >60        The eGFR has been calculated using the MDRD equation. This calculation has not been validated in all clinical       ASSESSMENT AND PLAN 49 y.o. year old male  has a past medical history of Bell's palsy (2008); Insomnia; Taste absent; Hypertension; High cholesterol; Headache(784.0); and OSA on CPAP. here with:  1. OSA on CPAP 2. Insomnia 3. headache  The patient's CPAP download is excellent. He  should continue using the CPAP nighty. The patient's sleep has improved with the use of benadryl and trazodone. However he still experiences excessive daytime sleepiness. I explained that benadryl is typically only used short term. In the future we can consider using a sleeping agent. He has used belsomra and Ambien in the past. If daytime sleepiness persist then we may consider the use of a stimulant to assist with the daytime sleepiness. Currently the patient is able to complete his daily activities. I have advised the patient to call the office if he would like to try these medications after he has been evaluated for possible hernia surgery. Patient verbalized understanding. If his symptoms worsen or he develops new symptoms he will let us know.   Ward Givens, MSN, NP-C 03/28/2014, 8:38 AM [facility] [address], [place], [place] [address] [phone]  Note: This document was prepared with digital dictation and possible smart phrase technology. Any transcriptional errors that result from this process are unintentional.

## 2014-04-02 ENCOUNTER — Ambulatory Visit (HOSPITAL_COMMUNITY): Payer: 59

## 2014-04-06 ENCOUNTER — Ambulatory Visit (HOSPITAL_COMMUNITY): Payer: 59 | Attending: Cardiology

## 2014-04-06 ENCOUNTER — Other Ambulatory Visit (HOSPITAL_COMMUNITY): Payer: Self-pay | Admitting: Family Medicine

## 2014-04-06 DIAGNOSIS — G4700 Insomnia, unspecified: Secondary | ICD-10-CM | POA: Diagnosis not present

## 2014-04-06 DIAGNOSIS — E785 Hyperlipidemia, unspecified: Secondary | ICD-10-CM | POA: Insufficient documentation

## 2014-04-06 DIAGNOSIS — R51 Headache: Secondary | ICD-10-CM | POA: Diagnosis not present

## 2014-04-06 DIAGNOSIS — R0600 Dyspnea, unspecified: Secondary | ICD-10-CM

## 2014-04-06 DIAGNOSIS — J189 Pneumonia, unspecified organism: Secondary | ICD-10-CM | POA: Diagnosis not present

## 2014-04-06 DIAGNOSIS — G4733 Obstructive sleep apnea (adult) (pediatric): Secondary | ICD-10-CM | POA: Insufficient documentation

## 2014-04-06 DIAGNOSIS — I10 Essential (primary) hypertension: Secondary | ICD-10-CM | POA: Insufficient documentation

## 2014-04-06 NOTE — Progress Notes (Signed)
2D Echo completed. 04/06/2014

## 2014-04-11 ENCOUNTER — Other Ambulatory Visit: Payer: Self-pay | Admitting: Neurology

## 2014-04-12 ENCOUNTER — Telehealth: Payer: Self-pay | Admitting: *Deleted

## 2014-04-12 NOTE — Telephone Encounter (Signed)
Looking back at my note, I made no mention of cardiology referral. Not sure if another physicians office told the patient this. Please call the patient and clarify.

## 2014-04-12 NOTE — Telephone Encounter (Signed)
Patient had an appt today at another doctors office and they were calling because the patient told they that MM told him she was going to put in a referral in for Cardiology. The patient is having surgery coming up and they want him seen before surgery. Please call the patient to let him know if he needs the Cardiology or not. Please advise

## 2014-04-13 NOTE — Telephone Encounter (Signed)
spoke with patient and the issue is resolved

## 2014-04-26 ENCOUNTER — Other Ambulatory Visit (HOSPITAL_COMMUNITY): Payer: Self-pay | Admitting: Family Medicine

## 2014-04-26 ENCOUNTER — Other Ambulatory Visit: Payer: Self-pay | Admitting: Family Medicine

## 2014-04-26 ENCOUNTER — Ambulatory Visit (HOSPITAL_COMMUNITY)
Admission: RE | Admit: 2014-04-26 | Discharge: 2014-04-26 | Disposition: A | Discharge status: Home / Self Care | Payer: Worker's Compensation | Source: Ambulatory Visit | Attending: Family Medicine | Admitting: Family Medicine

## 2014-04-26 ENCOUNTER — Encounter (HOSPITAL_COMMUNITY): Payer: Self-pay

## 2014-04-26 ENCOUNTER — Ambulatory Visit
Admission: RE | Admit: 2014-04-26 | Discharge: 2014-04-26 | Disposition: A | Discharge status: Home / Self Care | Payer: 59 | Source: Ambulatory Visit | Attending: Family Medicine | Admitting: Family Medicine

## 2014-04-26 DIAGNOSIS — R079 Chest pain, unspecified: Secondary | ICD-10-CM | POA: Diagnosis not present

## 2014-04-26 DIAGNOSIS — R0602 Shortness of breath: Secondary | ICD-10-CM | POA: Diagnosis not present

## 2014-04-26 MED ORDER — IOHEXOL 350 MG/ML SOLN
100.0000 mL | Freq: Once | INTRAVENOUS | Status: AC | PRN
  Administered 2014-04-26: 70 mL via INTRAVENOUS

## 2014-05-04 ENCOUNTER — Ambulatory Visit: Payer: 59 | Admitting: Internal Medicine

## 2014-08-17 ENCOUNTER — Telehealth: Payer: Self-pay | Admitting: Internal Medicine

## 2014-08-17 NOTE — Telephone Encounter (Signed)
Received records from [facility] forwarded to Dr. Melvyn Novas 08/17/14 fbg.

## 2014-08-24 ENCOUNTER — Institutional Professional Consult (permissible substitution): Payer: Self-pay | Admitting: Internal Medicine

## 2014-08-28 ENCOUNTER — Ambulatory Visit (INDEPENDENT_AMBULATORY_CARE_PROVIDER_SITE_OTHER)
Admission: RE | Admit: 2014-08-28 | Discharge: 2014-08-28 | Disposition: A | Discharge status: Home / Self Care | Payer: 59 | Source: Ambulatory Visit | Attending: Internal Medicine | Admitting: Internal Medicine

## 2014-08-28 ENCOUNTER — Encounter: Payer: Self-pay | Admitting: Internal Medicine

## 2014-08-28 ENCOUNTER — Ambulatory Visit (INDEPENDENT_AMBULATORY_CARE_PROVIDER_SITE_OTHER): Payer: 59 | Admitting: Internal Medicine

## 2014-08-28 VITALS — BP 130/80 | HR 69 | Ht 70.0 in | Wt 172.0 lb

## 2014-08-28 DIAGNOSIS — I10 Essential (primary) hypertension: Secondary | ICD-10-CM | POA: Diagnosis not present

## 2014-08-28 DIAGNOSIS — R0600 Dyspnea, unspecified: Secondary | ICD-10-CM

## 2014-08-28 HISTORY — DX: Dyspnea, unspecified: R06.00

## 2014-08-28 MED ORDER — VALSARTAN 160 MG PO TABS: 160.0000 mg | ORAL_TABLET | Freq: Every day | ORAL | Status: DC

## 2014-08-28 NOTE — Progress Notes (Signed)
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 Pulmonary Consult    Referred by Dr. Brigitte Pulse.  Pt c/o SOB for the past 3 yrs. SOB bothers him mainly with exertion, but will notice at rest. He states that he can get out of breath walking to his mailbox and back.   onset was abrupt with pan in 2014 and persistent since then Breathing sitting still ok/ on cpap no sob but not sure it's working right as never feels rested / f/u per neuro  Sob bending over Total of 300 ft sob nl pace most days  Not doing treadmill for a year  No better p saba   No obvious patterns in day to day or daytime variabilty or assoc chronic cough or cp or chest tightness, subjective wheeze overt sinus or hb symptoms. No unusual exp hx or h/o childhood pna/ asthma or knowledge of premature birth.  Sleeping ok without nocturnal  or early am exacerbation  of respiratory  c/o's or need for noct saba. Also denies any obvious fluctuation of symptoms with weather or environmental changes or other aggravating or alleviating factors except as outlined above   Current Medications, Allergies, Complete Past Medical History, Past Surgical History, Family History, and Social History were reviewed in [employer] record.          Review of Systems  Constitutional: Negative for fever, chills, activity change, appetite change and unexpected weight change.  HENT: Negative for congestion, dental problem, postnasal drip, rhinorrhea, sneezing, sore throat, trouble swallowing and voice change.   Eyes: Negative for visual disturbance.  Respiratory: Positive for shortness of breath.  Negative for cough and choking.   Cardiovascular: Negative for chest pain and leg swelling.  Gastrointestinal: Negative for nausea, vomiting and abdominal pain.  Genitourinary: Negative for difficulty urinating.  Musculoskeletal: Negative for arthralgias.  Skin: Negative for rash.  Psychiatric/Behavioral: Negative for behavioral problems and confusion.       Objective:   Physical Exam  Wt Readings from Last 3 Encounters:  08/28/14 172 lb (78.019 kg)  03/28/14 184 lb (83.462 kg)  09/22/13 174 lb (78.926 kg)    Vital signs reviewed  Anxious wm nad    HEENT: nl dentition, turbinates, and orophanx. Nl external ear canals without cough reflex   NECK :  without JVD/Nodes/TM/ nl carotid upstrokes bilaterally   LUNGS: no acc muscle use, clear to A and P bilaterally without cough on insp or exp maneuvers   CV:  RRR  no s3 or murmur or increase in P2, no edema   ABD:  soft and nontender with nl excursion in the supine position. No bruits or organomegaly, bowel sounds nl  MS:  warm without deformities, calf tenderness, cyanosis or clubbing  SKIN: warm and dry without lesions    NEURO:  alert, approp, no deficits     CXR PA and Lateral:   08/28/2014 :     I personally reviewed images and agree with radiology impression as follows:    The lungs are adequately inflated and clear. The heart and pulmonary vascularity are  normal. The mediastinum is normal in width. There is no pleural effusion or pneumothorax. The bony thorax is unremarkable      Assessment & Plan:

## 2014-08-28 NOTE — Progress Notes (Signed)
Quick Note:  Spoke with pt and notified of results per Dr. Wert. Pt verbalized understanding and denied any questions.  ______ 

## 2014-08-28 NOTE — Patient Instructions (Addendum)
Stop lisinopril   Start valsartan 160 mg daily in place of lisinopril  Please remember to go to the   x-ray department downstairs for your tests - we will call you with the results when they are available.  GERD (REFLUX)  is an extremely common cause of respiratory symptoms just like yours , many times with no obvious heartburn at all.    It can be treated with medication, but also with lifestyle changes including elevation of the head of your bed (ideally with 6 inch  bed blocks),  Smoking cessation, avoidance of late meals, excessive alcohol, and avoid fatty foods, chocolate, peppermint, colas, red wine, and acidic juices such as orange juice.  NO MINT OR MENTHOL PRODUCTS SO NO COUGH DROPS  USE SUGARLESS CANDY INSTEAD (Jolley ranchers or Stover's or Life Savers) or even ice chips will also do - the key is to swallow to prevent all throat clearing. NO OIL BASED VITAMINS - use powdered substitutes.    Please schedule a follow up office visit in 4 weeks, sooner if needed with pfts on return

## 2014-08-29 DIAGNOSIS — I10 Essential (primary) hypertension: Secondary | ICD-10-CM | POA: Insufficient documentation

## 2014-08-29 NOTE — Assessment & Plan Note (Addendum)
-   08/28/2014  Walked RA x 3 laps @ 185 ft each stopped due to end of study, fast pace, no doe or desats   Symptoms are markedly disproportionate to objective findings and not clear this is a lung problem but pt does appear to have difficult airway management issues. DDX of  difficult airways management all start with A and  include Adherence, Ace Inhibitors, Acid Reflux, Active Sinus Disease, Alpha 1 Antitripsin deficiency, Anxiety masquerading as Airways dz,  ABPA,  allergy(esp in young), Aspiration (esp in elderly), Adverse effects of meds,  Active smokers, A bunch of PE's (a small clot burden can't cause this syndrome unless there is already severe underlying pulm or vascular dz with poor reserve) plus two Bs  = Bronchiectasis and Beta blocker use..and one C= CHF  ACEi at top of the usual list of suspects > see hbp  ? Acid (or non-acid) GERD > always difficult to exclude as up to 75% of pts in some series report no assoc GI/ Heartburn symptoms> rec max (  diet restrictions/ reviewed and instructions given in writing.   ? Anxiety/depression/ deconditioning > dx of exclusion but note he is trazadone at hs and should def continue   I had an extended discussion with the patient reviewing all relevant studies completed to date x 15 m  Each maintenance medication was reviewed in detail including most importantly the difference between maintenance and prns and under what circumstances the prns are to be triggered using an action plan format that is not reflected in the computer generated alphabetically organized AVS.    Please see instructions for details which were reviewed in writing and the patient given a copy highlighting the part that I personally wrote and discussed at today's ov.   Will return for full pfts off acei and then proceed with cpst if dx in doubt

## 2014-08-29 NOTE — Assessment & Plan Note (Addendum)
ACE inhibitors are problematic in  pts with airway complaints because  even experienced pulmonologists can't always distinguish ace effects from copd/asthma/pnds/ allergies etc.  By themselves they don't actually cause a problem, much like oxygen can't by itself start a fire, but they certainly serve as a powerful catalyst or enhancer for any "fire"  or inflammatory process in the upper airway, be it caused by an ET  tube or more commonly reflux (especially in the obese or pts with known GERD or who are on biphoshonates) or URI's, due to interference with bradykinin clearance.  The effects of acei on bradykinin levels occurs in 100% of pt's on acei (unless they surreptitiously stop the med!) but the classic cough is only reported in 5%.  This leaves 95% of pts on acei's  with a variety of syndromes including no identifiable symptom in most  vs non-specific symptoms(not always the classic cough)  that wax and wane depending on what other insult is occuring at the level of the upper airway .   Only way to know if acei contributing to symptoms is trial off x 6 weeks then return to regroup > rec valsartan 160 mg daily

## 2014-09-16 ENCOUNTER — Other Ambulatory Visit: Payer: Self-pay | Admitting: Neurology

## 2014-10-01 ENCOUNTER — Encounter: Payer: Self-pay | Admitting: Adult Health

## 2014-10-01 ENCOUNTER — Ambulatory Visit (INDEPENDENT_AMBULATORY_CARE_PROVIDER_SITE_OTHER): Payer: Commercial Managed Care - HMO | Admitting: Adult Health

## 2014-10-01 VITALS — BP 114/75 | HR 68 | Ht 70.0 in | Wt 166.5 lb

## 2014-10-01 DIAGNOSIS — R51 Headache: Secondary | ICD-10-CM | POA: Diagnosis not present

## 2014-10-01 DIAGNOSIS — Z8669 Personal history of other diseases of the nervous system and sense organs: Secondary | ICD-10-CM

## 2014-10-01 DIAGNOSIS — G4700 Insomnia, unspecified: Secondary | ICD-10-CM

## 2014-10-01 DIAGNOSIS — G4733 Obstructive sleep apnea (adult) (pediatric): Secondary | ICD-10-CM

## 2014-10-01 DIAGNOSIS — Z9989 Dependence on other enabling machines and devices: Principal | ICD-10-CM

## 2014-10-01 DIAGNOSIS — R519 Headache, unspecified: Secondary | ICD-10-CM

## 2014-10-01 NOTE — Progress Notes (Signed)
I agree with the assessment and plan as directed by NP .The patient is known to me .   Aniesha Haughn, MD  

## 2014-10-01 NOTE — Progress Notes (Signed)
PATIENT: Julian Oneal DOB: Jul 03, 1965  REASON FOR VISIT: follow up- obstructive sleep apnea on CPAP, insomnia, headaches HISTORY FROM: patient  HISTORY OF PRESENT ILLNESS: Mr. Kohles is a 49 year old male with a history of obstructive sleep apnea on CPAP, insomnia and headaches. He returns today for follow-up. The patient's CPAP download shows that he uses his machine 30 out of 30 days for compliance of 100%. He uses the machine greater than 4 hours 30 out of 30 days for compliance of 100%. On average he uses his machine 6 hours and 25 minutes. His AHI is 0.7 at 6 cm of water. The patient does not have a significant leak. The patient's Epworth sleepiness score is 16 and fatigue severity score is 49. The patient states that he continues to use trazodone and Benadryl to help him sleep a night. He states that he usually gets about 6 hours of sleep. However he still feels pretty fatigued during the day. He states that the fatigue has caused his residual symptoms from Bell's palsy to worsen. He states that when he is really exhausted he notices that he will slur his speech and liquid may run out of the side of his mouth when he is drinking fluids. In the past he has tried Belsomra and Ambien for insomnia without benefit. He was also prescribed Lunesta but insurance did not cover this medication. Patient states that his headaches have been controlled. He had a severe headache yesterday however he states this is the first headache he has had in several months. He returns today for an evaluation.  HISTORY 03/28/14: Mr. Cassaday is a 49 year old male with a history of OSA on CPAP and headaches. He returns today for follow-up. He is currently using Imitrex for the headaches. He reports that he has not had to use the Imitrex yet. He states that his headaches have been pretty controlled. The patient is currently on trazodone and reports that he gets about 7 hours of sleep each night. He states that he cut his trazodone  in half and has been taking 2 benadryl's each night. He states this combination has prolonged his sleep at night. His CPAP Download for 90 days shows 98 % compliance. He uses his machine on average for 6 hours and 53 minutes. His AHI is 0.9 on 6 cm H20. He still experiences daytime sleepiness. He states that some days he just feels drained. Epworth score 15. He recently was diagnosed with a hernia and has a consultation with a surgeon next week. He also recently smashed in finger while loading a wood stove. He is seeing a Copy Dr. Shelbie Hutching for this.   HISTORY 09/22/13: Mr. Diekman is a 49 year old male with a history of OSA on CPAP and headaches. He returns today for follow-up.patient is currently taking trazadone and that is working well for him. Patient reports that he gets about 4 hours of sleep a night. He goes to bed around 10 pm and wakes up around 3 am. Patient states that he has not noticed an improvement in his memory. He states he really noticed this once he was started on Topamax with Dr. Domingo Cocking, he has since then stopped this medication . Patient uses the CPAP nightly but doesn't really notice a change in his sleepiness and fatigue. Patient reports that his headaches come and go. He states that he will go weeks with no headaches then have several in a row. He describes his headaches as sharp pain  behind the eye or on one side of his face. He has tried multiple medications in the past with no success. He has been seen at a headache clinic before.   REVIEW OF SYSTEMS: Out of a complete 14 system review of symptoms, the patient complains only of the following symptoms, and all other reviewed systems are negative.  Shortness of breath, insomnia   ALLERGIES: Allergies  Allergen Reactions  . Amitriptyline     headaches  . Silenor [Doxepin Hcl]     headaches  . Tribenzor [Olmesartan-Amlodipine-Hctz]     headaches  . Topiramate     Severe headaches, memory loss    HOME  MEDICATIONS: Outpatient Prescriptions Prior to Visit  Medication Sig Dispense Refill  . cetirizine (ZYRTEC) 10 MG tablet Take 10 mg by mouth daily.    Marland Kitchen CIALIS 5 MG tablet   11  . diphenhydrAMINE (BENADRYL) 25 MG tablet Take 50 mg by mouth at bedtime.    . fenofibrate 160 MG tablet Take 160 mg by mouth daily.    . folic acid (FOLVITE) 025 MCG tablet Take 400 mcg by mouth daily.    . hydrochlorothiazide (HYDRODIURIL) 25 MG tablet Take 25 mg by mouth daily.    . Omega-3 Fatty Acids (FISH OIL) 1000 MG CAPS Take 1,000 mg by mouth 3 (three) times daily. One tablet three times daily    . SUMAtriptan (IMITREX) 50 MG tablet Take 1 tablet (50 mg total) by mouth once. May repeat in 2 hours if headache persists or recurs. 10 tablet 0  . traZODone (DESYREL) 150 MG tablet TAKE 1 TABLET BY MOUTH AT BEDTIME 30 tablet 0  . valsartan (DIOVAN) 160 MG tablet Take 1 tablet (160 mg total) by mouth daily. 30 tablet 11   No facility-administered medications prior to visit.    PAST MEDICAL HISTORY: Past Medical History  Diagnosis Date  . Bell's palsy 2008    Dr Jannifer Franklin  . Insomnia   . Taste absent   . Hypertension   . High cholesterol   . Headache(784.0)   . OSA on CPAP     PAST SURGICAL HISTORY: Past Surgical History  Procedure Laterality Date  . Hernia repair  [id number]  . Mouth surgery    . Foot surgery  2008  . Cholecystectomy  2003    FAMILY HISTORY: Family History  Problem Relation Age of Onset  . Cancer - Prostate Father   . Parkinson's disease Maternal Uncle   . Alzheimer's disease Paternal Aunt     in their late 60's  . Alzheimer's disease Paternal Uncle     in their late 49's  . Cancer - Prostate Maternal Grandfather   . Allergies Sister   . Heart disease Maternal Grandmother   . Heart disease Paternal Grandmother   . Prostate cancer Maternal Grandfather     SOCIAL HISTORY: History   Social History  . Marital Status: Married    Spouse Name: N/A  . Number of Children: 0   . 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     Social History Main Topics  . Smoking status: Former Smoker -- 0.25 packs/day for 5 years    Types: Cigarettes    Quit date: 03/02/1990  . Smokeless tobacco: Not on file  . Alcohol Use: 0.0 oz/week    0 Standard drinks or equivalent per week     Comment: occasionally  . Drug Use: No  .  Sexual Activity: Not on file   Other Topics Concern  . 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 pt denies any illegal drugs, tobacco use(quit in 1992), consumes alcohol occasionally and consumes coffee.      PHYSICAL EXAM  Filed Vitals:   10/01/14 0912  BP: 114/75  Pulse: 68  Height: 5\' 10"  (1.778 m)  Weight: 166 lb 8 oz (75.524 kg)   Body mass index is 23.89 kg/(m^2).  Generalized: Well developed, in no acute distress   Neurological examination  Mentation: Alert oriented to time, place, history taking. Follows all commands speech and language fluent Cranial nerve II-XII: Pupils were equal round reactive to light. Extraocular movements were full, visual field were full on confrontational test. Slight facial droop on the left from Bell's palsy. Uvula tongue midline. Head turning and shoulder shrug  were normal and symmetric. Motor: The motor testing reveals 5 over 5 strength of all 4 extremities. Good symmetric motor tone is noted throughout.  Sensory: Sensory testing is intact to soft touch on all 4 extremities. No evidence of extinction is noted.  Coordination: Cerebellar testing reveals good finger-nose-finger and heel-to-shin bilaterally.  Gait and station: Gait is normal. Tandem gait is normal. Romberg is negative. No drift is seen.  Reflexes: Deep tendon reflexes are symmetric and normal bilaterally.   DIAGNOSTIC DATA (LABS, IMAGING,  TESTING) - I reviewed patient records, labs, notes, testing and imaging myself where available.     ASSESSMENT AND PLAN 49 y.o. year old male  has a past medical history of Bell's palsy (2008); Insomnia; Taste absent; Hypertension; High cholesterol; Headache(784.0); and OSA on CPAP. here with:  1. Obstructive sleep apnea on CPAP 2. Headaches 3. Insomnia 4. History of Bell's palsy   The patient's compliance download is excellent. He should continue using the CPAP nightly. The patient's headaches have been controlled. The patient continues to suffer from insomnia. He is currently using trazodone and Benadryl. I have explained that Benadryl should not be used long-term and can potentially make his daytime sleepiness worse. I have discussed possibly using silenor or Sonata. The patient would like to check with his insurance before trying these medications. Patient advised that if his symptoms worsen or he develops new symptoms he should let us know. Otherwise he will follow-up in 6 months with Dr. Brett Fairy.    Ward Givens, MSN, NP-C 10/01/2014, 9:21 AM [facility] [address] [place], [place] [address] [phone]  Note: This document was prepared with digital dictation and possible smart phrase technology. Any transcriptional errors that result from this process are unintentional.

## 2014-10-01 NOTE — Patient Instructions (Signed)
Silenor and Sonata for sleep. Check to see if insurance will cover.  Continue CPAP nightly If headache frequency increases let us know.

## 2014-10-15 ENCOUNTER — Encounter: Payer: Self-pay | Admitting: Internal Medicine

## 2014-10-15 ENCOUNTER — Ambulatory Visit (INDEPENDENT_AMBULATORY_CARE_PROVIDER_SITE_OTHER): Payer: Commercial Managed Care - HMO | Admitting: Internal Medicine

## 2014-10-15 VITALS — BP 120/84 | HR 71 | Ht 69.0 in | Wt 168.0 lb

## 2014-10-15 DIAGNOSIS — R0600 Dyspnea, unspecified: Secondary | ICD-10-CM | POA: Diagnosis not present

## 2014-10-15 DIAGNOSIS — I10 Essential (primary) hypertension: Secondary | ICD-10-CM

## 2014-10-15 LAB — PULMONARY FUNCTION TEST
DL/VA % pred: 124 %
DL/VA: 5.75 ml/min/mmHg/L
DLCO unc % pred: 83 %
DLCO unc: 26.46 ml/min/mmHg
FEF 25-75 Post: 4.27 L/sec
FEF 25-75 Pre: 2.93 L/sec
FEF2575-%Change-Post: 45 %
FEF2575-%Pred-Post: 122 %
FEF2575-%Pred-Pre: 84 %
FEV1-%Change-Post: 8 %
FEV1-%Pred-Post: 87 %
FEV1-%Pred-Pre: 80 %
FEV1-Post: 3.4 L
FEV1-Pre: 3.12 L
FEV1FVC-%Change-Post: 7 %
FEV1FVC-%Pred-Pre: 104 %
FEV6-%Change-Post: 1 %
FEV6-%Pred-Post: 80 %
FEV6-%Pred-Pre: 79 %
FEV6-Post: 3.88 L
FEV6-Pre: 3.83 L
FEV6FVC-%Change-Post: 0 %
FEV6FVC-%Pred-Post: 103 %
FEV6FVC-%Pred-Pre: 103 %
FVC-%Change-Post: 1 %
FVC-%Pred-Post: 77 %
FVC-%Pred-Pre: 76 %
FVC-Post: 3.88 L
FVC-Pre: 3.83 L
Post FEV1/FVC ratio: 88 %
Post FEV6/FVC ratio: 100 %
Pre FEV1/FVC ratio: 82 %
Pre FEV6/FVC Ratio: 100 %
RV % pred: 72 %
RV: 1.45 L
TLC % pred: 73 %
TLC: 5.01 L

## 2014-10-15 NOTE — Progress Notes (Signed)
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 Pulmonary Consult    Referred by Dr. Brigitte Pulse.  Pt c/o SOB for the past 3 yrs. SOB bothers him mainly with exertion, but will notice at rest. He states that he can get out of breath walking to his mailbox and back.   onset was abrupt with pan in 2014 and persistent since then Breathing sitting still ok/ on cpap no sob but not sure it's working right as never feels rested / f/u per neuro  Sob bending over Total of 300 ft sob nl pace most days  Not doing treadmill for a year  No better p saba  rec Stop lisinopril  Start valsartan 160 mg daily in place of lisinopril Please remember to go to the x-ray department downstairs for your tests - we will call you with the results when they are available. GERD diet     10/15/2014 f/u ov/[name] re: sob ? Etiology / assoc with freq throat clearing and nl pfs  Chief Complaint  Patient presents with  . Follow-up    Pt here to discuss PFT results. Pt states that SOB has improved but not fully gone. Pt states worse with activity   not doing any treadmill ex but building a training facility at work and Not really limited by breathing from desired activities   No longer on zyrtec > throat clearing worse   No obvious day to day or daytime variability or assoc excess mucus or cp or chest tightness, subjective wheeze or overt sinus or hb symptoms. No unusual exp hx or h/o childhood pna/ asthma or knowledge of premature birth.  Sleeping ok without nocturnal  or early am exacerbation  of respiratory  c/o's or need for noct saba. Also  denies any obvious fluctuation of symptoms with weather or environmental changes or other aggravating or alleviating factors except as outlined above   Current Medications, Allergies, Complete Past Medical History, Past Surgical History, Family History, and Social History were reviewed in [employer] record.  ROS  The following are not active complaints unless bolded sore throat, dysphagia, dental problems, itching, sneezing,  nasal congestion or excess/ purulent secretions, ear ache,   fever, chills, sweats, unintended wt loss, classically pleuritic or exertional cp, hemoptysis,  orthopnea pnd or leg swelling, presyncope, palpitations, abdominal pain, anorexia, nausea, vomiting, diarrhea  or change in bowel or bladder habits, change in stools or urine, dysuria,hematuria,  rash, arthralgias, visual complaints, headache, numbness, weakness or ataxia or problems with walking or coordination,  change in mood/affect or memory.                     Objective:   Physical Exam   10/15/2014        168  Wt Readings from Last 3 Encounters:  08/28/14 172 lb (78.019 kg)  03/28/14 184 lb (83.462 kg)  09/22/13 174 lb (78.926 kg)    Vital signs reviewed  Anxious wm nad freq throat clearing    HEENT: nl dentition,  turbinates, and orophanx which is pristine/ no pnd or cobblestoning. Nl external ear canals without cough reflex   NECK :  without JVD/Nodes/TM/ nl carotid upstrokes bilaterally   LUNGS: no acc muscle use, clear to A and P bilaterally without cough on insp or exp maneuvers   CV:  RRR  no s3 or murmur or increase in P2, no edema   ABD:  soft and nontender with nl excursion in the supine position. No bruits or organomegaly, bowel sounds nl  MS:  warm without deformities, calf tenderness, cyanosis or clubbing  SKIN: warm and dry without lesions    NEURO:  alert, approp, no deficits     CXR PA and Lateral:   08/28/2014 :     I personally reviewed images  and agree with radiology impression as follows:    The lungs are adequately inflated and clear. The heart and pulmonary vascularity are normal. The mediastinum is normal in width. There is no pleural effusion or pneumothorax. The bony thorax is unremarkable      Assessment & Plan:

## 2014-10-15 NOTE — Progress Notes (Signed)
PFT done today. 

## 2014-10-15 NOTE — Patient Instructions (Signed)
Try to avoid throat clearing as much as possible - keep the candy handy  GERD (REFLUX)  is an extremely common cause of respiratory symptoms just like yours , many times with no obvious heartburn at all.    It can be treated with medication, but also with lifestyle changes including elevation of the head of your bed (ideally with 6 inch  bed blocks),  Smoking cessation, avoidance of late meals, excessive alcohol, and avoid fatty foods, chocolate, peppermint, colas, red wine, and acidic juices such as orange juice.  NO MINT OR MENTHOL PRODUCTS SO NO COUGH DROPS  USE SUGARLESS CANDY INSTEAD (Jolley ranchers or Stover's or Life Savers) or even ice chips will also do - the key is to swallow to prevent all throat clearing. NO OIL BASED VITAMINS - use powdered substitutes.    If not better, Try prilosec otc 20mg   Take 30-60 min before first meal of the day and Pepcid ac (famotidine) 20 mg one @  bedtime x at least 2 weeks before your test   To get the most out of exercise, you need to be continuously aware that you are short of breath, but never out of breath, for 30 minutes daily. As you improve, it will actually be easier for you to do the same amount of exercise  in  30 minutes so always push to the level where you are short of breath.    If not making progress with treadmill exercise  >  call [facility] for cpst [phone]

## 2014-10-16 ENCOUNTER — Other Ambulatory Visit: Payer: Self-pay | Admitting: Neurology

## 2014-10-21 ENCOUNTER — Encounter: Payer: Self-pay | Admitting: Internal Medicine

## 2014-10-21 NOTE — Assessment & Plan Note (Signed)
Adequate control on present rx, reviewed > no change in rx needed  > would avoid acei if possible in setting of excessive throat clearing and unexplained sob that improved off acei

## 2014-10-21 NOTE — Assessment & Plan Note (Signed)
-   08/28/2014  Walked RA x 3 laps @ 185 ft each stopped due to end of study, fast pace, no doe or desats    - trial off acei 08/28/2014 >> improved 10/15/2014  - PFT's  10/21/2014  FEV1 3.40 (87 % ) ratio 88  p 6 % improvement from saba with DLCO  83 % corrects to 124 % for alv volume    Def breathing better but still throat clearing, likely a residual from acei. rec efforts to stop throat clearing and then add gerd rx  If not satisfied with ex tol, next step is cpst  I had an extended final summary  discussion with the patient reviewing all relevant studies completed to date and  lasting 15 to 20 minutes of a 25 minute visit    Each maintenance medication was reviewed in detail including most importantly the difference between maintenance and prns and under what circumstances the prns are to be triggered using an action plan format that is not reflected in the computer generated alphabetically organized AVS.    Please see instructions for details which were reviewed in writing and the patient given a copy highlighting the part that I personally wrote and discussed at today's ov.

## 2015-04-04 ENCOUNTER — Ambulatory Visit (INDEPENDENT_AMBULATORY_CARE_PROVIDER_SITE_OTHER): Payer: Commercial Managed Care - HMO | Admitting: Neurology

## 2015-04-04 ENCOUNTER — Encounter: Payer: Self-pay | Admitting: Neurology

## 2015-04-04 VITALS — BP 118/70 | HR 80 | Resp 20 | Ht 70.0 in | Wt 178.0 lb

## 2015-04-04 DIAGNOSIS — G4719 Other hypersomnia: Secondary | ICD-10-CM

## 2015-04-04 DIAGNOSIS — G4733 Obstructive sleep apnea (adult) (pediatric): Secondary | ICD-10-CM

## 2015-04-04 DIAGNOSIS — Z9989 Dependence on other enabling machines and devices: Principal | ICD-10-CM

## 2015-04-04 HISTORY — DX: Other hypersomnia: G47.19

## 2015-04-04 NOTE — Progress Notes (Signed)
PATIENT: Julian Oneal DOB: Mar 26, 1965  REASON FOR VISIT: follow up- obstructive sleep apnea on CPAP, insomnia, headaches HISTORY FROM: patient  HISTORY OF PRESENT ILLNESS: Julian Oneal is a 50 year old male with a history of obstructive sleep apnea on CPAP, insomnia and headaches. He returns today for follow-up. The patient's CPAP download shows that he uses his machine 30 out of 30 days for compliance of 100%.  04-04-15 He uses the machine greater than 4 hours 30 out of 30 days for compliance of 100%. On average he uses his machine 6 hours and 30 minutes. His AHI is 0.6 at 6 cm of water. The patient does not have a significant leak. The patient's Epworth sleepiness score is 18 and fatigue severity score is 57.  The patient states that he continues to use trazodone and Benadryl to help him sleep a night. He states that he usually gets about 6 hours of sleep.  However he still feels pretty fatigued during the day.  He states that the fatigue has caused his residual symptoms from Bell's palsy to worsen. He states that when he is really exhausted he notices that he will slur his speech and liquid may run out of the side of his mouth when he is drinking fluids. In the past he has tried Belsomra and Ambien for insomnia without benefit. He was also prescribed Lunesta but insurance did not cover this medication. Patient states that his headaches have been controlled. He had a severe headache yesterday however he states this is the first headache he has had in several months. He returns today for an evaluation.  Fatigue and sleepiness in spite of compliance with CPAP - Modafinil.    HISTORY 03/28/14: Julian Oneal is a 50 year old male with a history of OSA on CPAP and headaches. He returns today for follow-up. He is currently using Imitrex for the headaches. He reports that he has not had to use the Imitrex yet. He states that his headaches have been pretty controlled. The patient is currently on trazodone and  reports that he gets about 7 hours of sleep each night. He states that he cut his trazodone in half and has been taking 2 benadryl's each night. He states this combination has prolonged his sleep at night. His CPAP Download for 90 days shows 98 % compliance. He uses his machine on average for 6 hours and 53 minutes. His AHI is 0.9 on 6 cm H20. He still experiences daytime sleepiness. He states that some days he just feels drained. Epworth score 15. He recently was diagnosed with a hernia and has a consultation with a surgeon next week. He also recently smashed in finger while loading a wood stove. He is seeing a Copy Dr. Shelbie Hutching for this.   HISTORY 09/22/13: Julian Oneal is a 50 year old male with a history of OSA on CPAP and headaches. He returns today for follow-up.patient is currently taking trazadone and that is working well for him. Patient reports that he gets about 4 hours of sleep a night. He goes to bed around 10 pm and wakes up around 3 am. Patient states that he has not noticed an improvement in his memory. He states he really noticed this once he was started on Topamax with Dr. Domingo Cocking, he has since then stopped this medication . Patient uses the CPAP nightly but doesn't really notice a change in his sleepiness and fatigue. Patient reports that his headaches come and go. He states that he  will go weeks with no headaches then have several in a row. He describes his headaches as sharp pain behind the eye or on one side of his face. He has tried multiple medications in the past with no success. He has been seen at a headache clinic before.   REVIEW OF SYSTEMS: Out of a complete 14 system review of symptoms, the patient complains only of the following symptoms, and all other reviewed systems are negative.  Shortness of breath, insomnia   ALLERGIES: Allergies  Allergen Reactions  . Amitriptyline     headaches  . Silenor [Doxepin Hcl]     headaches  . Tribenzor [Olmesartan-Amlodipine-Hctz]      headaches  . Topiramate     Severe headaches, memory loss    HOME MEDICATIONS: Outpatient Prescriptions Prior to Visit  Medication Sig Dispense Refill  . cetirizine (ZYRTEC) 10 MG tablet Take 10 mg by mouth daily as needed.     Marland Kitchen CIALIS 5 MG tablet   11  . diphenhydrAMINE (BENADRYL) 25 MG tablet Take 50 mg by mouth at bedtime.    . fenofibrate 160 MG tablet Take 160 mg by mouth daily.    . folic acid (FOLVITE) A999333 MCG tablet Take 400 mcg by mouth daily.    . hydrochlorothiazide (HYDRODIURIL) 25 MG tablet Take 25 mg by mouth daily.    . traZODone (DESYREL) 150 MG tablet TAKE 1 TABLET BY MOUTH AT BEDTIME 30 tablet 6  . valsartan (DIOVAN) 160 MG tablet Take 1 tablet (160 mg total) by mouth daily. 30 tablet 11  . Omega-3 Fatty Acids (FISH OIL) 1000 MG CAPS Take 1,000 mg by mouth 3 (three) times daily. One tablet three times daily    . SUMAtriptan (IMITREX) 50 MG tablet Take 1 tablet (50 mg total) by mouth once. May repeat in 2 hours if headache persists or recurs. 10 tablet 0   No facility-administered medications prior to visit.    PAST MEDICAL HISTORY: Past Medical History  Diagnosis Date  . Bell's palsy 2008    Dr Jannifer Franklin  . Insomnia   . Taste absent   . Hypertension   . High cholesterol   . Headache(784.0)   . OSA on CPAP     PAST SURGICAL HISTORY: Past Surgical History  Procedure Laterality Date  . Hernia repair  [id number]  . Mouth surgery    . Foot surgery  2008  . Cholecystectomy  2003    FAMILY HISTORY: Family History  Problem Relation Age of Onset  . Cancer - Prostate Father   . Parkinson's disease Maternal Uncle   . Alzheimer's disease Paternal Aunt     in their late 10's  . Alzheimer's disease Paternal Uncle     in their late 53's  . Cancer - Prostate Maternal Grandfather   . Allergies Sister   . Heart disease Maternal Grandmother   . Heart disease Paternal Grandmother   . Prostate cancer Maternal Grandfather     SOCIAL HISTORY: Social History     Social History  . Marital Status: Married    Spouse Name: N/A  . Number of Children: 0  . 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     Social History Main Topics  . Smoking status: Former Smoker -- 0.25 packs/day for 5 years    Types: Cigarettes    Quit date: 03/02/1990  . Smokeless tobacco: Not on file  . Alcohol  Use: 0.0 oz/week    0 Standard drinks or equivalent per week     Comment: occasionally  . Drug Use: No  . Sexual Activity: Not on file   Other Topics Concern  . 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 pt denies any illegal drugs, tobacco use(quit in 1992), consumes alcohol occasionally and consumes coffee.      PHYSICAL EXAM  Filed Vitals:   04/04/15 1016  BP: 118/70  Pulse: 80  Resp: 20  Height: 5\' 10"  (1.778 m)  Weight: 178 lb (80.74 kg)   Body mass index is 25.54 kg/(m^2).  Generalized: Well developed, in no acute distress  Neurological examination  Mentation: Alert oriented to time, place, history taking. Follows all commands speech and language fluent Cranial nerve ; no loss of smell or taste. Pupils were equal round reactive to light. Extraocular movements were full, visual field were full on confrontational test. Slight facial droop on the left from Bell's palsy. Uvula and tongue moved midline. Head turning and shoulder shrug  were normal and symmetric. Motor: The motor testing reveals 5 / 5 strength of upper extremities.Good symmetric motor tone is noted throughout.  Sensory: Sensory testing is intact to soft touch on trunc and all 4 extremities.  Coordination: Cerebellar testing reveals good finger-nose-finger  bilaterally.  Gait and station: Gait is normal. Tandem gait is normal. Romberg is negative. No drift is seen.  Reflexes: Deep tendon  reflexes are symmetric and normal bilaterally.   DIAGNOSTIC DATA (LABS, IMAGING, TESTING) - I reviewed patient records, labs, notes, testing and imaging myself where available.  CPAP downloads obtained in office.     ASSESSMENT AND PLAN 50 y.o. year old male  has a past medical history of Bell's palsy (2008); Insomnia; Taste absent; Hypertension; High cholesterol; Headache(784.0); and OSA on CPAP. here with:  1. Obstructive sleep apnea on CPAP, has no longer oral air leaks- reports  Ongoing EDS. Modafinil offered and declined by patient. .  2. Headaches 3. Insomnia- excessive daytime sleepiness on CPAP.  4. History of Bell's palsy   OSA - CPAP- The patient's compliance download on 04-04-15 is excellent.  Our 20 minute revisit was more than 50% face to face and dedicated to coordination of care.   He should continue using the CPAP nightly. The patient's headaches have been controlled. The patient continues to suffer from insomnia. He is currently using trazodone and Benadryl.   I have explained that Benadryl should not be used long-term and can potentially make his daytime sleepiness worse. I have discussed possibly using silenor or Sonata.   Headaches improved.    Julian Oneal   04/04/2015, 10:39 AM [facility] [address], [place], [place] [address] [phone]

## 2015-05-08 ENCOUNTER — Other Ambulatory Visit: Payer: Self-pay | Admitting: Neurology

## 2015-05-28 ENCOUNTER — Other Ambulatory Visit: Payer: Self-pay | Admitting: General Surgery

## 2015-05-28 DIAGNOSIS — G8929 Other chronic pain: Secondary | ICD-10-CM

## 2015-05-28 DIAGNOSIS — R1031 Right lower quadrant pain: Principal | ICD-10-CM

## 2015-06-03 ENCOUNTER — Ambulatory Visit
Admission: RE | Admit: 2015-06-03 | Discharge: 2015-06-03 | Disposition: A | Discharge status: Home / Self Care | Payer: Worker's Compensation | Source: Ambulatory Visit | Attending: General Surgery | Admitting: General Surgery

## 2015-06-03 DIAGNOSIS — G8929 Other chronic pain: Secondary | ICD-10-CM

## 2015-06-03 DIAGNOSIS — R1031 Right lower quadrant pain: Principal | ICD-10-CM

## 2015-06-03 MED ORDER — IOPAMIDOL (ISOVUE-300) INJECTION 61%
100.0000 mL | Freq: Once | INTRAVENOUS | Status: AC | PRN
  Administered 2015-06-03: 100 mL via INTRAVENOUS

## 2015-08-11 ENCOUNTER — Other Ambulatory Visit: Payer: Self-pay | Admitting: Internal Medicine

## 2015-08-26 ENCOUNTER — Other Ambulatory Visit: Payer: Self-pay | Admitting: Internal Medicine

## 2015-09-10 ENCOUNTER — Encounter: Payer: Self-pay | Admitting: Cardiology

## 2015-09-11 ENCOUNTER — Other Ambulatory Visit: Payer: Self-pay | Admitting: Cardiology

## 2015-09-11 DIAGNOSIS — R0600 Dyspnea, unspecified: Secondary | ICD-10-CM

## 2015-09-11 NOTE — Progress Notes (Signed)
Patient ID: EDU ON, male   DOB: 11/23/1965, 50 y.o.   MRN: [id number]   Julian Oneal    Date of visit:  09/10/2015 DOB:  [birth year]    Age:  50 yrs. Medical record number:  [id number]     Account number:  [id number] Primary Care Provider: Mayra Neer ____________________________ CURRENT DIAGNOSES  1. Hypertensive heart disease without heart failure  2. Dyspnea  3. Hyperlipidemia  4. Sleep apnea  5. Family history of ischemic heart disease and other diseases of the circulatory system ____________________________ ALLERGIES  Amitriptyline, Headache  Silenor, Headache  Topamax, Memory loss  Tribenzor, Headache ____________________________ MEDICATIONS  1. Cialis 5 mg tablet, PRN  2. folic acid 382 mcg tablet, 1 p.o. daily  3. trazodone 150 mg tablet, PRN  4. fenofibrate 160 mg tablet, 1 p.o. daily  5. hydrochlorothiazide 25 mg tablet, 1 p.o. daily  6. Benadryl 25 mg capsule, PRN  7. tramadol 50 mg tablet, PRN  8. Ventolin HFA 90 mcg/actuation aerosol inhaler, PRN  9. Afrin (oxymetazoline) 0.05 % nasal spray, PRN  10. ibuprofen 200 mg tablet, PRN  11. Aleve 220 mg tablet, PRN  12. diltiazem ER 120 mg capsule,extended release, 1 p.o. daily  13. Prilosec OTC 20 mg tablet,delayed release, PRN  14. valsartan 160 mg tablet, 1 p.o. daily ____________________________ HISTORY OF PRESENT ILLNESS Patient returns for cardiac followup he had more frequent indigestion recently and has been taking TUMS and other antacids on a more regular basis. He denies exertional-type symptoms but still has significant shortness of breath. He has a family history of cardiovascular disease. Her previous exercise testing has not shown evidence of ischemia but he had a recent firefighter physical and the chest discomfort that he was having as well as the ongoing dyspnea has been a concern for his employment. ____________________________ PAST HISTORY  Past Medical Illnesses:  hypertension, sleep apnea;   Cardiovascular Illnesses:  no previous history of cardiac disease.;  Surgical Procedures:  cholecystectomy (lap), inguinal herniorrhaphy-rt, l foot surg;  NYHA Classification:  I;  Canadian Angina Classification:  Class 0: Asymptomatic;  Cardiology Procedures-Invasive:  no history of prior cardiac procedures;  Cardiology Procedures-Noninvasive:  echocardiogram April 2017;  LVEF of 60% documented via echocardiogram on 06/10/2015,   ____________________________ CARDIO-PULMONARY TEST DATES EKG Date:  04/16/2014;  Echocardiography Date: 06/10/2015;   ____________________________ FAMILY HISTORY Father -- Father alive with problem, Prostate cancer, Hypertension Mother -- Mother alive and well Sister -- Sister alive with problem, Hypertension, Sleep apnea Sister -- Sister alive and well ____________________________ SOCIAL HISTORY Alcohol Use:  no alcohol use;  Smoking:  used to smoke but quit 1992;  Diet:  regular diet;  Lifestyle:  married and no children;  Exercise:  some exercise;  Occupation:  Oceanographer;  Residence:  lives with wife;   ____________________________ REVIEW OF SYSTEMS General:  malaise and fatigue Eyes: denies diplopia, history of glaucoma or visual problems. Respiratory: dyspnea with exertion Cardiovascular:  please review HPI  Genitourinary-Male: frequency, erectile dysfunction Neurological:  occasional headaches Psychiatric:  insomnia  ____________________________ PHYSICAL EXAMINATION VITAL SIGNS  Blood Pressure:  112/70 Sitting, Left arm, regular cuff  , 112/74 Standing, Left arm and regular cuff   Pulse:  72/min. Weight:  170.00 lbs. Height:  70"BMI: 24  Constitutional:  pleasant white male in no acute distress Skin:  warm and dry to touch, no apparent skin lesions, or masses noted. Head:  normocephalic, normal hair pattern, no masses or tenderness Neck:  supple, without  massess. No JVD, thyromegaly or carotid bruits. Carotid upstroke  normal. Chest:  normal symmetry, clear to auscultation. Cardiac:  regular rhythm, normal S1 and S2, No S3 or S4, no murmurs, gallops or rubs detected. Peripheral Pulses:  the femoral,dorsalis pedis, and posterior tibial pulses are full and equal bilaterally with no bruits auscultated. Extremities & Back:  no deformities, clubbing, cyanosis, erythema or edema observed. Normal muscle strength and tone. Neurological:  no gross motor or sensory deficits noted, affect appropriate, oriented x3. ____________________________ MOST RECENT LIPID PANEL 03/04/14  CHOL TOTL 206 mg/dl, LDL 117 NM, HDL 45 mg/dl, TRIGLYCER 220 mg/dl, ALT 22 u/l, ALK PHOS 36 u/l, CHOL/HDL 4.6 (Calc) and AST 19 u/l ____________________________ IMPRESSIONS/PLAN  1. Continued exertional dyspnea in a patient with family history of cardiovascular disease 2. Hypertensive heart disease 3. Hyperlipidemia 4. Chest pain with some atypical features  Recommendations:  He continues to have shortness of breath with exertion and has a family history of cardiovascular disease. 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 My recommendations would be for him to have a cardiac CTA in light of his family history and ongoing symptoms that are somewhat limiting to him. He has had evidence of some diastolic dysfunction on echocardiogram today. Followup after the cardiac CTA.  ____________________________ TODAYS ORDERS  1. Cardiac CTA: First Available  2. Return After Diagnostic Testing:                        ____________________________ Cardiology Physician:  Kerry Hough MD [facility]

## 2015-09-13 ENCOUNTER — Encounter: Payer: Self-pay | Admitting: Cardiology

## 2015-09-17 ENCOUNTER — Telehealth: Payer: Self-pay | Admitting: Neurology

## 2015-09-17 DIAGNOSIS — G4733 Obstructive sleep apnea (adult) (pediatric): Secondary | ICD-10-CM

## 2015-09-17 DIAGNOSIS — Z9989 Dependence on other enabling machines and devices: Principal | ICD-10-CM

## 2015-09-17 NOTE — Telephone Encounter (Signed)
I called pt to discuss. No answer, left a message asking him to call me back. 

## 2015-09-17 NOTE — Telephone Encounter (Signed)
I spoke to pt. He has been having problems will billing and customer service at [facility] and would like to switch DMEs. I recommended [facility] and pt is agreeable. Will send the referral to [place]. Pt verbalized understanding.

## 2015-09-17 NOTE — Telephone Encounter (Signed)
Patient is calling and would like a new CPAP vendor as he is not happy with [facility].  Please call.

## 2015-09-19 ENCOUNTER — Ambulatory Visit (HOSPITAL_COMMUNITY)
Admission: RE | Admit: 2015-09-19 | Discharge: 2015-09-19 | Disposition: A | Discharge status: Home / Self Care | Payer: Commercial Managed Care - HMO | Source: Ambulatory Visit | Attending: Cardiology | Admitting: Cardiology

## 2015-09-19 ENCOUNTER — Encounter (HOSPITAL_COMMUNITY): Payer: Self-pay

## 2015-09-19 DIAGNOSIS — R0600 Dyspnea, unspecified: Secondary | ICD-10-CM | POA: Diagnosis present

## 2015-09-19 DIAGNOSIS — R079 Chest pain, unspecified: Secondary | ICD-10-CM | POA: Diagnosis not present

## 2015-09-19 MED ORDER — NITROGLYCERIN 0.4 MG SL SUBL
0.4000 mg | SUBLINGUAL_TABLET | SUBLINGUAL | Status: DC | PRN
  Administered 2015-09-19 (×2): 0.4 mg via SUBLINGUAL

## 2015-09-19 MED ORDER — METOPROLOL TARTRATE 5 MG/5ML IV SOLN
5.0000 mg | INTRAVENOUS | Status: DC | PRN
  Administered 2015-09-19: 5 mg via INTRAVENOUS

## 2015-09-19 MED ORDER — METOPROLOL TARTRATE 5 MG/5ML IV SOLN
INTRAVENOUS | Status: AC
  Filled 2015-09-19: qty 5

## 2015-09-19 MED ORDER — IOPAMIDOL (ISOVUE-300) INJECTION 61%: 100.0000 mL | Freq: Once | INTRAVENOUS | Status: DC | PRN

## 2015-09-19 MED ORDER — IOPAMIDOL (ISOVUE-370) INJECTION 76%
INTRAVENOUS | Status: AC
  Filled 2015-09-19: qty 100

## 2015-09-19 MED ORDER — NITROGLYCERIN 0.4 MG SL SUBL
SUBLINGUAL_TABLET | SUBLINGUAL | Status: AC
  Filled 2015-09-19: qty 2

## 2015-09-19 NOTE — Progress Notes (Signed)
CT completed. Tolerated well. D/C home walking. Awake and alert. In no distress. 

## 2015-09-20 MED ORDER — IOPAMIDOL (ISOVUE-370) INJECTION 76%
100.0000 mL | Freq: Once | INTRAVENOUS | Status: AC | PRN
  Administered 2015-09-19: 100 mL via INTRAVENOUS

## 2015-12-12 ENCOUNTER — Other Ambulatory Visit: Payer: Self-pay | Admitting: Neurology

## 2016-04-06 ENCOUNTER — Ambulatory Visit (INDEPENDENT_AMBULATORY_CARE_PROVIDER_SITE_OTHER): Payer: Commercial Managed Care - HMO | Admitting: Adult Health

## 2016-04-06 ENCOUNTER — Encounter: Payer: Self-pay | Admitting: Adult Health

## 2016-04-06 VITALS — BP 121/69 | HR 78 | Ht 70.0 in | Wt 180.4 lb

## 2016-04-06 DIAGNOSIS — Z9989 Dependence on other enabling machines and devices: Secondary | ICD-10-CM | POA: Diagnosis not present

## 2016-04-06 DIAGNOSIS — G4700 Insomnia, unspecified: Secondary | ICD-10-CM | POA: Diagnosis not present

## 2016-04-06 DIAGNOSIS — R51 Headache: Secondary | ICD-10-CM | POA: Diagnosis not present

## 2016-04-06 DIAGNOSIS — G4733 Obstructive sleep apnea (adult) (pediatric): Secondary | ICD-10-CM

## 2016-04-06 DIAGNOSIS — R519 Headache, unspecified: Secondary | ICD-10-CM

## 2016-04-06 MED ORDER — GABAPENTIN 100 MG PO CAPS
ORAL_CAPSULE | ORAL | 11 refills | Status: DC
Start: 2016-04-06 — End: 2017-04-06

## 2016-04-06 NOTE — Progress Notes (Signed)
PATIENT: Julian Oneal DOB: Dec 16, 1965  REASON FOR VISIT: follow up- obstructive sleep apnea on CPAP, insomnia, headaches HISTORY FROM: patient  HISTORY OF PRESENT ILLNESS: Today 04/06/2016 Julian Oneal is a 51 year old with a history of obstructive sleep apnea on CPAP, insomnia and headaches. He returns today for follow-up. The patient CPAP download indicates that he has great compliance. He uses machine nightly for compliance of 100%. He uses machine greater than 4 hours 29 out of 30 days for compliance of 97%. On average he uses his machine 6 hours and 44 minutes. He has a residual AHI is 0.7 on 6 cm of water. He does not have a leak. The patient states that starting about 6 months ago he began to have daily headaches again. He states that the headaches normally occur mid afternoon. Reports that most of the time his headaches are not severe. He reports taking ibuprofen with some benefit. He states that if the headache is severe he normally has to lay down. In the past he has tried Topamax, amitriptyline, metoprolol and Imitrex without benefit. The patient also has a history of Bell's palsy. He reports that since Bell's palsy he continues to have numbness on the left side of the face. This also continues to affect his speech. He is using trazodone at night to help with sleep. Reports that he no longer uses Benadryl. The patient reports that his primary care diagnosed him with tennis elbow on the right. He reports that he has some numbness in the fingertips and pain in the bicep when he is holding things. He has a follow-up with his primary care next week. He returns today for an evaluation.  HISTORY 04/04/15: Julian Oneal is a 51 year old male with a history of obstructive sleep apnea on CPAP, insomnia and headaches. He returns today for follow-up. The patient's CPAP download shows that he uses his machine 30 out of 30 days for compliance of 100%.  04-04-15 He uses the machine greater than 4 hours 30 out of  30 days for compliance of 100%. On average he uses his machine 6 hours and 30 minutes. His AHI is 0.6 at 6 cm of water. The patient does not have a significant leak. The patient's Epworth sleepiness score is 18 and fatigue severity score is 57.  The patient states that he continues to use trazodone and Benadryl to help him sleep a night. He states that he usually gets about 6 hours of sleep.  However he still feels pretty fatigued during the day.  He states that the fatigue has caused his residual symptoms from Bell's palsy to worsen. He states that when he is really exhausted he notices that he will slur his speech and liquid may run out of the side of his mouth when he is drinking fluids. In the past he has tried Belsomra and Ambien for insomnia without benefit. He was also prescribed Lunesta but insurance did not cover this medication. Patient states that his headaches have been controlled. He had a severe headache yesterday however he states this is the first headache he has had in several months. He returns today for an evaluation.  Fatigue and sleepiness in spite of compliance with CPAP - Modafinil.   REVIEW OF SYSTEMS: Out of a complete 14 system review of symptoms, the patient complains only of the following symptoms, and all other reviewed systems are negative.  See history of present illness  ALLERGIES: Allergies  Allergen Reactions  . Amitriptyline  headaches  . Silenor [Doxepin Hcl]     headaches  . Tribenzor [Olmesartan-Amlodipine-Hctz]     headaches  . Topiramate     Severe headaches, memory loss    HOME MEDICATIONS: Outpatient Medications Prior to Visit  Medication Sig Dispense Refill  . cetirizine (ZYRTEC) 10 MG tablet Take 10 mg by mouth daily as needed.     Marland Kitchen CIALIS 5 MG tablet   11  . fenofibrate 160 MG tablet Take 160 mg by mouth daily.    . folic acid (FOLVITE) A999333 MCG tablet Take 400 mcg by mouth daily.    . hydrochlorothiazide (HYDRODIURIL) 25 MG tablet Take  25 mg by mouth daily.    . traZODone (DESYREL) 150 MG tablet TAKE 1 TABLET BY MOUTH AT BEDTIME 30 tablet 6  . valsartan (DIOVAN) 160 MG tablet TAKE 1 TABLET (160 MG TOTAL) BY MOUTH DAILY. 30 tablet 0  . diphenhydrAMINE (BENADRYL) 25 MG tablet Take 50 mg by mouth at bedtime.     No facility-administered medications prior to visit.     PAST MEDICAL HISTORY: Past Medical History:  Diagnosis Date  . Bell's palsy 2008   Dr Jannifer Franklin  . Headache(784.0)   . High cholesterol   . Hypertension   . Insomnia   . OSA on CPAP   . Taste absent     PAST SURGICAL HISTORY: Past Surgical History:  Procedure Laterality Date  . CHOLECYSTECTOMY  2003  . FOOT SURGERY  2008  . HERNIA REPAIR  [id number]  . MOUTH SURGERY      FAMILY HISTORY: Family History  Problem Relation Age of Onset  . Cancer - Prostate Father   . Parkinson's disease Maternal Uncle   . Alzheimer's disease Paternal Aunt     in their late 25's  . Alzheimer's disease Paternal Uncle     in their late 75's  . Cancer - Prostate Maternal Grandfather   . Allergies Sister   . Heart disease Maternal Grandmother   . Heart disease Paternal Grandmother   . Prostate cancer Maternal Grandfather     SOCIAL HISTORY: Social History   Social History  . Marital status: Married    Spouse name: N/A  . Number of children: 0  . Years of education: College   Occupational History  . fireman     [employer]  .  [employer]   Social History Main Topics  . Smoking status: Former Smoker    Packs/day: 0.25    Years: 5.00    Types: Cigarettes    Quit date: 03/02/1990  . Smokeless tobacco: Never Used  . Alcohol use 0.0 oz/week     Comment: occasionally  . Drug use: No  . Sexual activity: Not on file   Other Topics Concern  . 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 pt denies any illegal drugs, tobacco use(quit in 1992), consumes alcohol occasionally and consumes coffee.      PHYSICAL EXAM  Vitals:   04/06/16 0725  BP: 121/69  Pulse: 78  Weight: 180 lb 6.4 oz (81.8 kg)  Height: 5\' 10"  (1.778 m)   Body mass index is 25.88 kg/m.  Generalized: Well developed, in no acute distress   Neurological examination  Mentation: Alert oriented to time, place, history taking. Follows all commands speech and language fluent Cranial nerve II-XII: Pupils were  equal round reactive to light. Extraocular movements were full, visual field were full on confrontational test. Facial sensation Decreased on the left. Facial asymmetry on the left due to Bell's palsy. Uvula tongue midline. Head turning and shoulder shrug  were normal and symmetric. Motor: The motor testing reveals 5 over 5 strength of all 4 extremities. Good symmetric motor tone is noted throughout.  Sensory: Sensory testing is intact to soft touch on all 4 extremities. No evidence of extinction is noted.  Coordination: Cerebellar testing reveals good finger-nose-finger and heel-to-shin bilaterally.  Gait and station: Gait is normal. Tandem gait is normal. Romberg is negative. No drift is seen.  Reflexes: Deep tendon reflexes are symmetric and normal bilaterally.   DIAGNOSTIC DATA (LABS, IMAGING, TESTING) - I reviewed patient records, labs, notes, testing and imaging myself where available.     ASSESSMENT AND PLAN 51 y.o. year old male  has a past medical history of Bell's palsy (2008); Headache(784.0); High cholesterol; Hypertension; Insomnia; OSA on CPAP; and Taste absent. here with:  1. Obstructive sleep apnea on CPAP 2. Insomnia 3. Headaches  The patient will continue using the CPAP nightly. He has a compliance with good treatment of his apnea. He will continue using trazodone for insomnia. The patient has been having daily headaches again. He has tried amitriptyline, Topamax, with  metoprolol  and Imitrex without benefit in the past. We will try the patient on a low-dose of gabapentin. He will begin taken 100 mg at bedtime for 1 week and if tolerating increase to 100 mg twice a day. Patient iss having numbness in the fingertips. He was diagnosed with tennis elbow and has a follow-up with his primary care next week. I advised the patient that if his primary care felt that this was not tennis elbow that we could order nerve conduction studies on the upper extremities. He voiced understanding. He will follow-up in one year or sooner if needed.     Ward Givens, MSN, NP-C 04/06/2016, 7:32 AM [facility] [address], [place], [place] [address] [phone]

## 2016-04-06 NOTE — Patient Instructions (Signed)
Start Gabapentin 100 mg at bedtime for 1 week then increase 1 tablet twice a day. If unable to tolerate the morning dose due to drowsiness then take 2 tablets at bedtime If your symptoms worsen or you develop new symptoms please let us know.  Continue using CPAP nightly

## 2016-04-06 NOTE — Progress Notes (Signed)
I agree with the assessment and plan as directed by NP .The patient is known to me .   Omarr Hann, MD  

## 2016-04-17 DIAGNOSIS — Z125 Encounter for screening for malignant neoplasm of prostate: Secondary | ICD-10-CM | POA: Diagnosis not present

## 2016-04-17 DIAGNOSIS — Z0000 Encounter for general adult medical examination without abnormal findings: Secondary | ICD-10-CM | POA: Diagnosis not present

## 2016-04-17 DIAGNOSIS — I10 Essential (primary) hypertension: Secondary | ICD-10-CM | POA: Diagnosis not present

## 2016-04-24 DIAGNOSIS — M25521 Pain in right elbow: Secondary | ICD-10-CM | POA: Diagnosis not present

## 2016-04-24 DIAGNOSIS — M25522 Pain in left elbow: Secondary | ICD-10-CM | POA: Diagnosis not present

## 2016-04-24 DIAGNOSIS — M6281 Muscle weakness (generalized): Secondary | ICD-10-CM | POA: Diagnosis not present

## 2016-04-27 DIAGNOSIS — M25522 Pain in left elbow: Secondary | ICD-10-CM | POA: Diagnosis not present

## 2016-04-27 DIAGNOSIS — M6281 Muscle weakness (generalized): Secondary | ICD-10-CM | POA: Diagnosis not present

## 2016-04-27 DIAGNOSIS — M25521 Pain in right elbow: Secondary | ICD-10-CM | POA: Diagnosis not present

## 2016-04-29 DIAGNOSIS — G4733 Obstructive sleep apnea (adult) (pediatric): Secondary | ICD-10-CM | POA: Diagnosis not present

## 2016-05-01 DIAGNOSIS — M25522 Pain in left elbow: Secondary | ICD-10-CM | POA: Diagnosis not present

## 2016-05-01 DIAGNOSIS — M6281 Muscle weakness (generalized): Secondary | ICD-10-CM | POA: Diagnosis not present

## 2016-05-01 DIAGNOSIS — M25521 Pain in right elbow: Secondary | ICD-10-CM | POA: Diagnosis not present

## 2016-05-04 DIAGNOSIS — M25522 Pain in left elbow: Secondary | ICD-10-CM | POA: Diagnosis not present

## 2016-05-04 DIAGNOSIS — M6281 Muscle weakness (generalized): Secondary | ICD-10-CM | POA: Diagnosis not present

## 2016-05-04 DIAGNOSIS — M25521 Pain in right elbow: Secondary | ICD-10-CM | POA: Diagnosis not present

## 2016-05-06 DIAGNOSIS — M25521 Pain in right elbow: Secondary | ICD-10-CM | POA: Diagnosis not present

## 2016-05-06 DIAGNOSIS — M25522 Pain in left elbow: Secondary | ICD-10-CM | POA: Diagnosis not present

## 2016-05-06 DIAGNOSIS — M6281 Muscle weakness (generalized): Secondary | ICD-10-CM | POA: Diagnosis not present

## 2016-05-18 DIAGNOSIS — M25521 Pain in right elbow: Secondary | ICD-10-CM | POA: Diagnosis not present

## 2016-05-18 DIAGNOSIS — M6281 Muscle weakness (generalized): Secondary | ICD-10-CM | POA: Diagnosis not present

## 2016-05-18 DIAGNOSIS — M25522 Pain in left elbow: Secondary | ICD-10-CM | POA: Diagnosis not present

## 2016-06-01 DIAGNOSIS — M6281 Muscle weakness (generalized): Secondary | ICD-10-CM | POA: Diagnosis not present

## 2016-06-01 DIAGNOSIS — M25521 Pain in right elbow: Secondary | ICD-10-CM | POA: Diagnosis not present

## 2016-06-01 DIAGNOSIS — M25522 Pain in left elbow: Secondary | ICD-10-CM | POA: Diagnosis not present

## 2016-06-11 DIAGNOSIS — Z1211 Encounter for screening for malignant neoplasm of colon: Secondary | ICD-10-CM | POA: Diagnosis not present

## 2016-06-11 DIAGNOSIS — D126 Benign neoplasm of colon, unspecified: Secondary | ICD-10-CM | POA: Diagnosis not present

## 2016-07-04 ENCOUNTER — Other Ambulatory Visit: Payer: Self-pay | Admitting: Neurology

## 2016-09-10 ENCOUNTER — Other Ambulatory Visit: Payer: Self-pay | Admitting: Neurology

## 2016-09-10 MED ORDER — TRAZODONE HCL 150 MG PO TABS: 150.0000 mg | ORAL_TABLET | Freq: Every day | ORAL | 3 refills | Status: DC

## 2016-09-14 DIAGNOSIS — G4733 Obstructive sleep apnea (adult) (pediatric): Secondary | ICD-10-CM | POA: Diagnosis not present

## 2016-10-28 DIAGNOSIS — E782 Mixed hyperlipidemia: Secondary | ICD-10-CM | POA: Diagnosis not present

## 2016-10-28 DIAGNOSIS — I10 Essential (primary) hypertension: Secondary | ICD-10-CM | POA: Diagnosis not present

## 2016-10-28 DIAGNOSIS — R51 Headache: Secondary | ICD-10-CM | POA: Diagnosis not present

## 2016-11-10 DIAGNOSIS — E784 Other hyperlipidemia: Secondary | ICD-10-CM | POA: Diagnosis not present

## 2016-11-10 DIAGNOSIS — I119 Hypertensive heart disease without heart failure: Secondary | ICD-10-CM | POA: Diagnosis not present

## 2016-11-10 DIAGNOSIS — Z8249 Family history of ischemic heart disease and other diseases of the circulatory system: Secondary | ICD-10-CM | POA: Diagnosis not present

## 2016-11-11 ENCOUNTER — Ambulatory Visit (INDEPENDENT_AMBULATORY_CARE_PROVIDER_SITE_OTHER): Payer: Commercial Managed Care - HMO | Admitting: Orthopaedic Surgery

## 2016-11-11 ENCOUNTER — Ambulatory Visit (INDEPENDENT_AMBULATORY_CARE_PROVIDER_SITE_OTHER): Payer: Commercial Managed Care - HMO

## 2016-11-11 DIAGNOSIS — M25551 Pain in right hip: Secondary | ICD-10-CM

## 2016-11-11 NOTE — Progress Notes (Signed)
Office Visit Note   Patient: Julian Oneal           Date of Birth: 10/30/1965           MRN: [id number] Visit Date: 11/11/2016              Requested by: Mayra Neer, MD [address] [place], [place] [address] PCP: Mayra Neer, MD   Assessment & Plan: Visit Diagnoses:  1. Pain in right hip     Plan: This still may be hernia related and related to the mesh from the hernia. I do feel be appropriate to try an intra-articular steroid injection in his right hip to see if this could be both diagnostic and therapeutic for him. I explained the rationale behind this and he is issued and trying this as well. So we will set this up with Dr. Ernestina Oneal and then I will see him back in about 3 weeks. If this does help with his symptoms I would likely then obtain an MR I arthrogram of the right hip.  Follow-Up Instructions: Return in about 3 weeks (around 12/02/2016).   Orders:  Orders Placed This Encounter  Procedures  . XR HIP UNILAT W OR W/O PELVIS 1V RIGHT   No orders of the defined types were placed in this encounter.     Procedures: No procedures performed   Clinical Data: No additional findings.   Subjective: No chief complaint on file. Patient is very pleasant 51 year old gentleman who comes in with chief complaint of right hip pain is been slowly worsening for a year. He points the groin as the source of his pain. It is more activity relating but sometimes he can be a constant pain. Of note he's had 3 hernia surgeries on that side and he does have mesh around the hip. He points that to the groin as a source of his pain. It started detrimentally affects his activities daily living, his quality of life, and his mobility. HPI  Review of Systems  he currently denies any headache, chest pain, short of breath, fever, chills, nausea, vomiting.  Objective: Vital Signs: There were no vitals taken for this visit.  Physical Exam  he is alert or 3 and in no acute  distress Ortho Exam  examination of his right hip shows fluid internal/external rotation with only slight pain which seems to be more soft tissue related and it is the hip joint itself. The pain is on extremes of rotation but it seems to be again some over not the deep structures in the hip joint itself. Specialty Comments:  No specialty comments available.  Imaging: Xr Hip Unilat W Or W/o Pelvis 1v Right  Result Date: 11/11/2016 An AP pelvis lateral right hip shows a well maintained hip joint with no significant arthritic findings or no acute findings. He can see lucencies due to likely mesh from previous hernia surgeries.    PMFS History: Patient Active Problem List   Diagnosis Date Noted  . Excessive daytime sleepiness 04/04/2015  . OSA on CPAP 04/04/2015  . Essential hypertension 08/29/2014  . Dyspnea 08/28/2014  . Pneumonia, viral 06/20/2013  . Headaches, cluster 06/20/2013  . Sleep apnea with use of continuous positive airway pressure (CPAP) 08/02/2012  . Insomnia, persistent 07/05/2012  . Obstructive sleep apnea 07/05/2012  . Persistent headaches 07/05/2012   Past Medical History:  Diagnosis Date  . Bell's palsy 2008   Dr Jannifer Franklin  . Headache(784.0)   . High cholesterol   .  Hypertension   . Insomnia   . OSA on CPAP   . Taste absent     Family History  Problem Relation Age of Onset  . Cancer - Prostate Father   . Parkinson's disease Maternal Uncle   . Alzheimer's disease Paternal Aunt        in their late 57's  . Alzheimer's disease Paternal Uncle        in their late 18's  . Cancer - Prostate Maternal Grandfather   . Prostate cancer Maternal Grandfather   . Allergies Sister   . Heart disease Maternal Grandmother   . Heart disease Paternal Grandmother     Past Surgical History:  Procedure Laterality Date  . CHOLECYSTECTOMY  2003  . FOOT SURGERY  2008  . HERNIA REPAIR  [id number]  . 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  [employer]   Social History Main Topics  . Smoking status: Former Smoker    Packs/day: 0.25    Years: 5.00    Types: Cigarettes    Quit date: 03/02/1990  . Smokeless tobacco: Never Used  . Alcohol use 0.0 oz/week     Comment: occasionally  . Drug use: No  . Sexual activity: Not on file

## 2016-11-12 ENCOUNTER — Other Ambulatory Visit (INDEPENDENT_AMBULATORY_CARE_PROVIDER_SITE_OTHER): Payer: Self-pay

## 2016-11-12 DIAGNOSIS — M25551 Pain in right hip: Secondary | ICD-10-CM

## 2016-11-25 ENCOUNTER — Ambulatory Visit (INDEPENDENT_AMBULATORY_CARE_PROVIDER_SITE_OTHER): Payer: Commercial Managed Care - HMO | Admitting: Physical Medicine and Rehabilitation

## 2016-11-25 ENCOUNTER — Encounter (INDEPENDENT_AMBULATORY_CARE_PROVIDER_SITE_OTHER): Payer: Self-pay | Admitting: Physical Medicine and Rehabilitation

## 2016-11-25 ENCOUNTER — Ambulatory Visit (INDEPENDENT_AMBULATORY_CARE_PROVIDER_SITE_OTHER): Payer: Commercial Managed Care - HMO

## 2016-11-25 DIAGNOSIS — M25551 Pain in right hip: Secondary | ICD-10-CM

## 2016-11-25 NOTE — Progress Notes (Deleted)
Right hip and groin pain for over a year. Worse the past 6 months. Worse with driving and stairs.

## 2016-11-25 NOTE — Patient Instructions (Signed)

## 2016-11-25 NOTE — Progress Notes (Signed)
Julian Oneal - 51 y.o. male MRN [id number]  Date of birth: 1966-02-15  Office Visit Note: Visit Date: 11/25/2016 PCP: Julian Neer, MD Referred by: Julian Neer, MD  Subjective: Chief Complaint  Patient presents with  . Right Hip - Pain   HPI: [name] a 51 year old gentleman comes in today at the request of Julian. Ninfa Oneal for anesthetic and diagnostic and therapeutic hip arthrogram on the right. He has a history of worsening right hip and groin pain for over a year. Really worse over the last 6 months. He gets really constant symptoms but worse with driving and going up and down stairs. He also has a history of prior hernia surgeries with mesh.    ROS Otherwise per HPI.  Assessment & Plan: Visit Diagnoses:  1. Pain in right hip     Plan: Findings:  Diagnostic and hopefully therapeutic anesthetic hip arthrogram was performed. Patient did not have much relief during the anesthetic phase of the injection.    Meds & Orders: No orders of the defined types were placed in this encounter.   Orders Placed This Encounter  Procedures  . Large Joint Injection/Arthrocentesis  . XR C-ARM NO REPORT    Follow-up: Return for Julian. Ninfa Oneal.   Procedures: Diagnostic and therapeutic anesthetic hip arthrogram Date/Time: 11/25/2016 1:35 PM Performed by: Julian Oneal Authorized by: Julian Oneal   Consent Given by:  Patient Site marked: the procedure site was marked   Timeout: prior to procedure the correct patient, procedure, and site was verified   Indications:  Pain and diagnostic evaluation Location:  Hip Site:  R hip joint Prep: patient was prepped and draped in usual sterile fashion   Needle Size:  22 G Approach:  Anterior Ultrasound Guidance: No   Fluoroscopic Guidance: No   Arthrogram: Yes   Medications:  3 mL bupivacaine 0.5 %; 80 mg triamcinolone acetonide 40 MG/ML Aspiration Attempted: Yes   Patient tolerance:  Patient tolerated the procedure well with no  immediate complications  Arthrogram demonstrated excellent flow of contrast throughout the joint surface without extravasation or obvious defect.  The patient did not have much relief of symptoms during the anesthetic phase of the injection.      No notes on file   Clinical History: No specialty comments available.  He reports that he quit smoking about 26 years ago. His smoking use included Cigarettes. He has a 1.25 pack-year smoking history. He has never used smokeless tobacco. No results for input(s): HGBA1C, LABURIC in the last 8760 hours.  Objective:  VS:  HT:    WT:   BMI:     BP:   HR: bpm  TEMP: ( )  RESP:  Physical Exam  Musculoskeletal:  Really no pain or range of motion of the right hip except for extreme end ranges.    Ortho Exam Imaging: Xr C-arm No Report  Result Date: 11/25/2016 Please see Notes or Procedures tab for imaging impression.   Past Medical/Family/Surgical/Social History: Medications & Allergies reviewed per EMR Patient Active Problem List   Diagnosis Date Noted  . Excessive daytime sleepiness 04/04/2015  . OSA on CPAP 04/04/2015  . Essential hypertension 08/29/2014  . Dyspnea 08/28/2014  . Pneumonia, viral 06/20/2013  . Headaches, cluster 06/20/2013  . Sleep apnea with use of continuous positive airway pressure (CPAP) 08/02/2012  . Insomnia, persistent 07/05/2012  . Obstructive sleep apnea 07/05/2012  . Persistent headaches 07/05/2012   Past Medical History:  Diagnosis Date  . Bell's palsy 2008  Julian Oneal  . Headache(784.0)   . High cholesterol   . Hypertension   . Insomnia   . OSA on CPAP   . Taste absent    Family History  Problem Relation Age of Onset  . Cancer - Prostate Father   . Parkinson's disease Maternal Uncle   . Alzheimer's disease Paternal Aunt        in their late 56's  . Alzheimer's disease Paternal Uncle        in their late 49's  . Cancer - Prostate Maternal Grandfather   . Prostate cancer Maternal  Grandfather   . Allergies Sister   . Heart disease Maternal Grandmother   . Heart disease Paternal Grandmother    Past Surgical History:  Procedure Laterality Date  . CHOLECYSTECTOMY  2003  . FOOT SURGERY  2008  . HERNIA REPAIR  [id number]  . 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  [employer]   Social History Main Topics  . Smoking status: Former Smoker    Packs/day: 0.25    Years: 5.00    Types: Cigarettes    Quit date: 03/02/1990  . Smokeless tobacco: Never Used  . Alcohol use 0.0 oz/week     Comment: occasionally  . Drug use: No  . Sexual activity: Not on file

## 2016-11-26 MED ORDER — BUPIVACAINE HCL 0.5 % IJ SOLN
3.0000 mL | INTRAMUSCULAR | Status: AC | PRN
  Administered 2016-11-25: 3 mL via INTRA_ARTICULAR

## 2016-11-26 MED ORDER — TRIAMCINOLONE ACETONIDE 40 MG/ML IJ SUSP
80.0000 mg | INTRAMUSCULAR | Status: AC | PRN
  Administered 2016-11-25: 80 mg via INTRA_ARTICULAR

## 2016-12-02 ENCOUNTER — Ambulatory Visit (INDEPENDENT_AMBULATORY_CARE_PROVIDER_SITE_OTHER): Payer: 59 | Admitting: Orthopaedic Surgery

## 2016-12-02 ENCOUNTER — Other Ambulatory Visit (INDEPENDENT_AMBULATORY_CARE_PROVIDER_SITE_OTHER): Payer: Self-pay

## 2016-12-02 DIAGNOSIS — M25551 Pain in right hip: Secondary | ICD-10-CM | POA: Diagnosis not present

## 2016-12-02 NOTE — Progress Notes (Signed)
The patient is here after having an intra-articular right hip steroid injection by Dr. Ernestina Patches. He said between the lidocaine in the steroid did not help at all even symptomatically 1 bit. I was hoping that it would help however this also shows assisted most of his problems are likely not hip related. He does have groin pain is had previous surgery and some chronic pain in this area from previous hernia surgery.  On exam I can still easily put his right hip to full internal rotation rotation with some pain in the groin but it seems to be more medial than the hip joint itself. Again the intra-articular injection did not help at all and his x-rays of the right hip are normal.  Having said I like to send him to Dr. Coralie Keens with [facility] [place] [facility] to evaluate him in terms of the second opinion as a relates to previous hernia surgery on the right side.

## 2016-12-23 DIAGNOSIS — G4733 Obstructive sleep apnea (adult) (pediatric): Secondary | ICD-10-CM | POA: Diagnosis not present

## 2016-12-25 DIAGNOSIS — R1031 Right lower quadrant pain: Secondary | ICD-10-CM | POA: Diagnosis not present

## 2016-12-25 DIAGNOSIS — G8929 Other chronic pain: Secondary | ICD-10-CM | POA: Diagnosis not present

## 2016-12-30 DIAGNOSIS — Z23 Encounter for immunization: Secondary | ICD-10-CM | POA: Diagnosis not present

## 2017-01-07 ENCOUNTER — Other Ambulatory Visit: Payer: Self-pay | Admitting: Family Medicine

## 2017-01-07 DIAGNOSIS — R5383 Other fatigue: Secondary | ICD-10-CM | POA: Diagnosis not present

## 2017-01-07 DIAGNOSIS — G43909 Migraine, unspecified, not intractable, without status migrainosus: Secondary | ICD-10-CM | POA: Diagnosis not present

## 2017-01-07 DIAGNOSIS — R634 Abnormal weight loss: Secondary | ICD-10-CM

## 2017-01-13 ENCOUNTER — Ambulatory Visit
Admission: RE | Admit: 2017-01-13 | Discharge: 2017-01-13 | Disposition: A | Discharge status: Home / Self Care | Payer: Commercial Managed Care - HMO | Source: Ambulatory Visit | Attending: Family Medicine | Admitting: Family Medicine

## 2017-01-13 DIAGNOSIS — R634 Abnormal weight loss: Secondary | ICD-10-CM

## 2017-01-13 DIAGNOSIS — R1030 Lower abdominal pain, unspecified: Secondary | ICD-10-CM | POA: Diagnosis not present

## 2017-01-13 DIAGNOSIS — R918 Other nonspecific abnormal finding of lung field: Secondary | ICD-10-CM | POA: Diagnosis not present

## 2017-01-13 MED ORDER — IOPAMIDOL (ISOVUE-300) INJECTION 61%
100.0000 mL | Freq: Once | INTRAVENOUS | Status: AC | PRN
  Administered 2017-01-13: 100 mL via INTRAVENOUS

## 2017-01-15 DIAGNOSIS — G8929 Other chronic pain: Secondary | ICD-10-CM | POA: Diagnosis not present

## 2017-01-15 DIAGNOSIS — R1031 Right lower quadrant pain: Secondary | ICD-10-CM | POA: Diagnosis not present

## 2017-01-20 DIAGNOSIS — J029 Acute pharyngitis, unspecified: Secondary | ICD-10-CM | POA: Diagnosis not present

## 2017-01-20 DIAGNOSIS — R5383 Other fatigue: Secondary | ICD-10-CM | POA: Diagnosis not present

## 2017-01-20 DIAGNOSIS — G43909 Migraine, unspecified, not intractable, without status migrainosus: Secondary | ICD-10-CM | POA: Diagnosis not present

## 2017-02-03 ENCOUNTER — Other Ambulatory Visit: Payer: Self-pay | Admitting: Surgery

## 2017-02-03 DIAGNOSIS — R1031 Right lower quadrant pain: Secondary | ICD-10-CM

## 2017-02-11 DIAGNOSIS — I10 Essential (primary) hypertension: Secondary | ICD-10-CM | POA: Diagnosis not present

## 2017-02-11 DIAGNOSIS — G43909 Migraine, unspecified, not intractable, without status migrainosus: Secondary | ICD-10-CM | POA: Diagnosis not present

## 2017-02-12 ENCOUNTER — Other Ambulatory Visit: Payer: Self-pay

## 2017-02-15 ENCOUNTER — Other Ambulatory Visit: Payer: Self-pay

## 2017-03-29 DIAGNOSIS — G4733 Obstructive sleep apnea (adult) (pediatric): Secondary | ICD-10-CM | POA: Diagnosis not present

## 2017-04-05 ENCOUNTER — Encounter: Payer: Self-pay | Admitting: Neurology

## 2017-04-06 ENCOUNTER — Encounter: Payer: Self-pay | Admitting: Adult Health

## 2017-04-06 ENCOUNTER — Ambulatory Visit: Payer: 59 | Admitting: Adult Health

## 2017-04-06 VITALS — BP 124/84 | HR 70 | Ht 70.0 in | Wt 162.2 lb

## 2017-04-06 DIAGNOSIS — G4700 Insomnia, unspecified: Secondary | ICD-10-CM | POA: Diagnosis not present

## 2017-04-06 DIAGNOSIS — Z9989 Dependence on other enabling machines and devices: Secondary | ICD-10-CM | POA: Diagnosis not present

## 2017-04-06 DIAGNOSIS — G43009 Migraine without aura, not intractable, without status migrainosus: Secondary | ICD-10-CM | POA: Diagnosis not present

## 2017-04-06 DIAGNOSIS — G4733 Obstructive sleep apnea (adult) (pediatric): Secondary | ICD-10-CM | POA: Diagnosis not present

## 2017-04-06 NOTE — Patient Instructions (Addendum)
Your Plan:  Continue CPAP Continue trazodone If your symptoms worsen or you develop new symptoms please let us know.   Thank you for coming to see [place] at [facility]. I hope we have been able to provide you high quality care today.  You may receive a patient satisfaction survey over the next few weeks. We would appreciate your feedback and comments so that we may continue to improve ourselves and the health of our patients.

## 2017-04-06 NOTE — Progress Notes (Signed)
PATIENT: Julian Oneal DOB: 01-Feb-1966  REASON FOR VISIT: follow up HISTORY FROM: patient  HISTORY OF PRESENT ILLNESS: Today 04/06/17 Julian Oneal is a 52 year old male with a history of obstructive sleep apnea on CPAP, insomnia and migraine headaches.  He returns today for follow-up.  His CPAP download indicates that he use his machine 30 out of 30 days for compliance of 100%.  He uses machine greater than 4 hours 24 out of 30 days for compliance of 80%.  He uses his machine on average 5 hours and 28 minutes each night.  His residual AHI is 0.9 on 6 cm of water.  The patient continues on trazodone for insomnia.  He reports that this works fairly well for him.  The patient states that he was recently in a migraine study for injections.  He states that that works well for him.  His primary care recently prescribed Emgality.  He reports that he had a loading dose and will be due for his next injection next month.  He reports that this is working well for him.  He does state that he gets the medication for free for the next 12 months with his discount card.  He reports when his prescription is renewed he would prefer that we prescribe medication in case it is not approved by his insurance.  I am amenable to this as long as his primary care is amenable.  HISTORY 04/06/2016 Julian Oneal is a 52 year old with a history of obstructive sleep apnea on CPAP, insomnia and headaches. He returns today for follow-up. The patient CPAP download indicates that he has great compliance. He uses machine nightly for compliance of 100%. He uses machine greater than 4 hours 29 out of 30 days for compliance of 97%. On average he uses his machine 6 hours and 44 minutes. He has a residual AHI is 0.7 on 6 cm of water. He does not have a leak. The patient states that starting about 6 months ago he began to have daily headaches again. He states that the headaches normally occur mid afternoon. Reports that most of the time his headaches  are not severe. He reports taking ibuprofen with some benefit. He states that if the headache is severe he normally has to lay down. In the past he has tried Topamax, amitriptyline, metoprolol and Imitrex without benefit. The patient also has a history of Bell's palsy. He reports that since Bell's palsy he continues to have numbness on the left side of the face. This also continues to affect his speech. He is using trazodone at night to help with sleep. Reports that he no longer uses Benadryl. The patient reports that his primary care diagnosed him with tennis elbow on the right. He reports that he has some numbness in the fingertips and pain in the bicep when he is holding things. He has a follow-up with his primary care next week. He returns today for an evaluation  REVIEW OF SYSTEMS: Out of a complete 14 system review of symptoms, the patient complains only of the following symptoms, and all other reviewed systems are negative.  See HPI  ALLERGIES: Allergies  Allergen Reactions  . Amitriptyline     headaches  . Silenor [Doxepin Hcl]     headaches  . Tribenzor [Olmesartan-Amlodipine-Hctz]     headaches  . Topiramate     Severe headaches, memory loss    HOME MEDICATIONS: Outpatient Medications Prior to Visit  Medication Sig Dispense Refill  . cetirizine (ZYRTEC)  10 MG tablet Take 10 mg by mouth daily as needed.     Marland Kitchen CIALIS 5 MG tablet   11  . diltiazem (DILACOR XR) 120 MG 24 hr capsule Take 120 mg by mouth daily.     . fenofibrate 160 MG tablet Take 160 mg by mouth daily.    . folic acid (FOLVITE) 751 MCG tablet Take 400 mcg by mouth daily.    Marland Kitchen Galcanezumab-gnlm (EMGALITY) 120 MG/ML SOAJ Inject into the skin. Taking one injection every 30 days.    . hydrochlorothiazide (HYDRODIURIL) 25 MG tablet Take 25 mg by mouth daily.    Marland Kitchen omeprazole (PRILOSEC) 20 MG capsule Take 20 mg by mouth daily.    . traZODone (DESYREL) 150 MG tablet Take 1 tablet (150 mg total) by mouth at bedtime. 90  tablet 3  . valsartan (DIOVAN) 160 MG tablet TAKE 1 TABLET (160 MG TOTAL) BY MOUTH DAILY. 30 tablet 0  . gabapentin (NEURONTIN) 100 MG capsule Take 1 PO at bedtime for 1 week then increase to 1 tablet BID. (Patient not taking: Reported on 04/06/2017) 60 capsule 11   No facility-administered medications prior to visit.     PAST MEDICAL HISTORY: Past Medical History:  Diagnosis Date  . Bell's palsy 2008   Dr Jannifer Franklin  . Headache(784.0)   . High cholesterol   . Hypertension   . Insomnia   . OSA on CPAP   . Taste absent     PAST SURGICAL HISTORY: Past Surgical History:  Procedure Laterality Date  . CHOLECYSTECTOMY  2003  . FOOT SURGERY  2008  . HERNIA REPAIR  [id number]  . MOUTH SURGERY      FAMILY HISTORY: Family History  Problem Relation Age of Onset  . Cancer - Prostate Father   . Parkinson's disease Maternal Uncle   . Alzheimer's disease Paternal Aunt        in their late 52's  . Alzheimer's disease Paternal Uncle        in their late 81's  . Cancer - Prostate Maternal Grandfather   . Prostate cancer Maternal Grandfather   . Allergies Sister   . Heart disease Maternal Grandmother   . Heart disease Paternal Grandmother     SOCIAL HISTORY: Social History   Socioeconomic History  . Marital status: Married    Spouse name: Not on file  . Number of children: 0  . Years of education: College  . Highest education level: Not on file  Social Needs  . Financial resource strain: Not on file  . Food insecurity - worry: Not on file  . Food insecurity - inability: Not on file  . Transportation needs - medical: Not on file  . Transportation needs - non-medical: Not on file  Occupational History  . 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 Smoking status: Former Smoker    Packs/day: 0.25    Years: 5.00    Pack years: 1.25    Types: Cigarettes    Last attempt to quit: 03/02/1990    Years  since quitting: 27.1  . Smokeless tobacco: Never Used  Substance and Sexual Activity  . Alcohol use: Yes    Alcohol/week: 0.0 oz    Comment: occasionally  . Drug use: No  . Sexual activity: Not on file  Other Topics Concern  . 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 pt denies any illegal drugs, tobacco use(quit in 1992), consumes alcohol occasionally and consumes coffee.      PHYSICAL EXAM  Vitals:   04/06/17 0719  BP: 124/84  Pulse: 70  Weight: 162 lb 3.2 oz (73.6 kg)  Height: '5\' 10"'  (1.778 m)   Body mass index is 23.27 kg/m.  Generalized: Well developed, in no acute distress   Neurological examination  Mentation: Alert oriented to time, place, history taking. Follows all commands speech and language fluent Cranial nerve II-XII: Pupils were equal round reactive to light. Extraocular movements were full, visual field were full on confrontational test. Facial sensation and strength were normal. Uvula tongue midline. Head turning and shoulder shrug  were normal and symmetric. Motor: The motor testing reveals 5 over 5 strength of all 4 extremities. Good symmetric motor tone is noted throughout.  Sensory: Sensory testing is intact to soft touch on all 4 extremities. No evidence of extinction is noted.  Coordination: Cerebellar testing reveals good finger-nose-finger and heel-to-shin bilaterally.  Gait and station: Gait is normal. Tandem gait is normal. Romberg is negative. No drift is seen.  Reflexes: Deep tendon reflexes are symmetric and normal bilaterally.   DIAGNOSTIC DATA (LABS, IMAGING, TESTING) - I reviewed patient records, labs, notes, testing and imaging myself where available.  Lab Results  Component Value Date   HGB 15.1 02/08/2008      Component Value Date/Time   NA 138 02/06/2008 1045   K 3.9 02/06/2008 1045   CL 103  02/06/2008 1045   CO2 30 02/06/2008 1045   GLUCOSE 93 02/06/2008 1045   BUN 9 02/06/2008 1045   CREATININE 0.96 02/06/2008 1045   CALCIUM 9.6 02/06/2008 1045   GFRNONAA >60 02/06/2008 1045   GFRAA  02/06/2008 1045    >60        The eGFR has been calculated using the MDRD equation. This calculation has not been validated in all clinical   No results found for: CHOL, HDL, LDLCALC, LDLDIRECT, TRIG, CHOLHDL No results found for: HGBA1C No results found for: VITAMINB12 No results found for: TSH    ASSESSMENT AND PLAN 52 y.o. year old male  has a past medical history of Bell's palsy (2008), Headache(784.0), High cholesterol, Hypertension, Insomnia, OSA on CPAP, and Taste absent. here with:  1.  Obstructive sleep apnea on CPAP 2.  Insomnia 3.  Migraine headache   Patient CPAP download shows good compliance and treatment of his apnea.  He will continue on trazodone for insomnia.  Currently he is trying Emgality for his migraine treatment.  I am amenable to taking over this prescription once it is renewed at the end of the year-as long as his PCP is also amenable to this.  He is advised that if his symptoms worsen or he develops new symptoms he should let us know.  He will follow-up in 1 year or sooner if needed.    Ward Givens, MSN, NP-C 04/06/2017, 7:30 AM [facility] [address] [place], [place] [address] [phone]

## 2017-04-08 NOTE — Progress Notes (Signed)
I agree with the assessment and plan as directed by NP .The patient is known to me .   Patrice Moates, MD  

## 2017-04-30 DIAGNOSIS — Z125 Encounter for screening for malignant neoplasm of prostate: Secondary | ICD-10-CM | POA: Diagnosis not present

## 2017-04-30 DIAGNOSIS — I10 Essential (primary) hypertension: Secondary | ICD-10-CM | POA: Diagnosis not present

## 2017-04-30 DIAGNOSIS — Z0000 Encounter for general adult medical examination without abnormal findings: Secondary | ICD-10-CM | POA: Diagnosis not present

## 2017-05-26 DIAGNOSIS — J069 Acute upper respiratory infection, unspecified: Secondary | ICD-10-CM | POA: Diagnosis not present

## 2017-05-26 DIAGNOSIS — R05 Cough: Secondary | ICD-10-CM | POA: Diagnosis not present

## 2017-08-19 ENCOUNTER — Ambulatory Visit
Admission: RE | Admit: 2017-08-19 | Discharge: 2017-08-19 | Disposition: A | Discharge status: Home / Self Care | Payer: 59 | Source: Ambulatory Visit | Attending: Family Medicine | Admitting: Family Medicine

## 2017-08-19 ENCOUNTER — Other Ambulatory Visit: Payer: Self-pay | Admitting: Family Medicine

## 2017-08-19 DIAGNOSIS — M2550 Pain in unspecified joint: Secondary | ICD-10-CM | POA: Diagnosis not present

## 2017-08-19 DIAGNOSIS — M25541 Pain in joints of right hand: Secondary | ICD-10-CM | POA: Diagnosis not present

## 2017-08-19 DIAGNOSIS — R52 Pain, unspecified: Secondary | ICD-10-CM

## 2017-08-19 DIAGNOSIS — K5900 Constipation, unspecified: Secondary | ICD-10-CM | POA: Diagnosis not present

## 2017-08-19 DIAGNOSIS — M79641 Pain in right hand: Secondary | ICD-10-CM | POA: Diagnosis not present

## 2017-08-19 DIAGNOSIS — M25542 Pain in joints of left hand: Secondary | ICD-10-CM | POA: Diagnosis not present

## 2017-09-10 ENCOUNTER — Other Ambulatory Visit: Payer: Self-pay | Admitting: Neurology

## 2017-09-20 DIAGNOSIS — R0602 Shortness of breath: Secondary | ICD-10-CM | POA: Diagnosis not present

## 2017-09-20 DIAGNOSIS — G4733 Obstructive sleep apnea (adult) (pediatric): Secondary | ICD-10-CM | POA: Diagnosis not present

## 2017-09-20 DIAGNOSIS — R0600 Dyspnea, unspecified: Secondary | ICD-10-CM | POA: Diagnosis not present

## 2017-09-20 DIAGNOSIS — I119 Hypertensive heart disease without heart failure: Secondary | ICD-10-CM | POA: Diagnosis not present

## 2017-09-24 DIAGNOSIS — R0609 Other forms of dyspnea: Secondary | ICD-10-CM | POA: Diagnosis not present

## 2017-09-27 DIAGNOSIS — G4733 Obstructive sleep apnea (adult) (pediatric): Secondary | ICD-10-CM | POA: Diagnosis not present

## 2017-10-29 DIAGNOSIS — I10 Essential (primary) hypertension: Secondary | ICD-10-CM | POA: Diagnosis not present

## 2017-10-29 DIAGNOSIS — E782 Mixed hyperlipidemia: Secondary | ICD-10-CM | POA: Diagnosis not present

## 2017-11-30 DIAGNOSIS — Z23 Encounter for immunization: Secondary | ICD-10-CM | POA: Diagnosis not present

## 2017-12-28 DIAGNOSIS — G43909 Migraine, unspecified, not intractable, without status migrainosus: Secondary | ICD-10-CM | POA: Diagnosis not present

## 2018-01-21 ENCOUNTER — Other Ambulatory Visit: Payer: Self-pay | Admitting: Cardiology

## 2018-01-21 NOTE — Telephone Encounter (Signed)
° ° °  1. Which medications need to be refilled? (please list name of each medication and dose if known) valsartan 160mg  tablet 1QD  2. Which pharmacy/location (including street and city if local pharmacy) is medication to be sent to? [facility] [address] [place]  3. Do they need a 30 day or 90 day supply? [place]

## 2018-01-24 NOTE — Telephone Encounter (Signed)
Left message for patient to return call regarding medication allergy.

## 2018-01-25 DIAGNOSIS — G43909 Migraine, unspecified, not intractable, without status migrainosus: Secondary | ICD-10-CM | POA: Diagnosis not present

## 2018-01-25 MED ORDER — VALSARTAN 160 MG PO TABS: ORAL_TABLET | ORAL | 0 refills | Status: DC

## 2018-01-25 NOTE — Telephone Encounter (Signed)
Patient called and confirmed that he has been taking Valsartan for a while and has not had any issues. Refill sent in, okayed bt Dr. Agustin Cree for Dr. Wynonia Lawman patient.

## 2018-01-31 ENCOUNTER — Other Ambulatory Visit: Payer: Self-pay | Admitting: Cardiology

## 2018-01-31 NOTE — Telephone Encounter (Signed)
° ° ° °  1. Which medications need to be refilled? (please list name of each medication and dose if known) Diltiazen 24hr ER 120mg  capsule  2. Which pharmacy/location (including street and city if local pharmacy) is medication to be sent to? [facility] [address] [place]  3. Do they need a 30 day or 90 day supply? [place]

## 2018-02-01 ENCOUNTER — Telehealth: Payer: Self-pay | Admitting: Emergency Medicine

## 2018-02-01 MED ORDER — DILTIAZEM HCL ER 120 MG PO CP24: 120.0000 mg | ORAL_CAPSULE | Freq: Every day | ORAL | 1 refills | Status: DC

## 2018-02-01 NOTE — Telephone Encounter (Signed)
Diltiazem 120 mg daily refilled per Dr. Bettina Gavia. Confirmed with patient that allergy to olmesartan-amlodipine has not been an issue while taking diltiazem.

## 2018-02-01 NOTE — Telephone Encounter (Signed)
[facility] that valsartan is not in stock. Called patient to ask where else he would like this sent. He will call office tomorrow and let us know.

## 2018-02-02 DIAGNOSIS — G4733 Obstructive sleep apnea (adult) (pediatric): Secondary | ICD-10-CM | POA: Diagnosis not present

## 2018-02-02 MED ORDER — VALSARTAN 160 MG PO TABS: ORAL_TABLET | ORAL | 0 refills | Status: DC

## 2018-02-02 NOTE — Telephone Encounter (Signed)
Patient called back with new pharmacy to send medication too. Medication refilled valsartan 160 mg daily per Dr. Bettina Gavia.

## 2018-02-02 NOTE — Addendum Note (Signed)
Addended by: Ashok Norris on: 02/02/2018 03:57 PM   Modules accepted: Orders

## 2018-03-08 ENCOUNTER — Other Ambulatory Visit: Payer: Self-pay | Admitting: Surgery

## 2018-04-01 ENCOUNTER — Other Ambulatory Visit: Payer: Self-pay | Admitting: Surgery

## 2018-04-07 ENCOUNTER — Other Ambulatory Visit: Payer: Self-pay | Admitting: Neurology

## 2018-04-07 ENCOUNTER — Ambulatory Visit: Payer: 59 | Admitting: Adult Health

## 2018-04-07 ENCOUNTER — Encounter: Payer: Self-pay | Admitting: Adult Health

## 2018-04-07 VITALS — BP 125/65 | HR 91 | Ht 70.0 in | Wt 157.0 lb

## 2018-04-07 DIAGNOSIS — G4730 Sleep apnea, unspecified: Secondary | ICD-10-CM

## 2018-04-07 DIAGNOSIS — G4700 Insomnia, unspecified: Secondary | ICD-10-CM

## 2018-04-07 DIAGNOSIS — Z9989 Dependence on other enabling machines and devices: Principal | ICD-10-CM

## 2018-04-07 DIAGNOSIS — G4733 Obstructive sleep apnea (adult) (pediatric): Secondary | ICD-10-CM

## 2018-04-07 NOTE — Patient Instructions (Signed)
Continue CPAP nightly and > than 4 hours every night We will discuss follow up with Dr Brett Fairy for possible repeat of sleep study   Sleep Apnea Sleep apnea is a condition in which breathing pauses or becomes shallow during sleep. Episodes of sleep apnea usually last 10 seconds or longer, and they may occur as many as 20 times an hour. Sleep apnea disrupts your sleep and keeps your body from getting the rest that it needs. This condition can increase your risk of certain health problems, including:  Heart attack.  Stroke.  Obesity.  Diabetes.  Heart failure.  Irregular heartbeat. There are three kinds of sleep apnea:  Obstructive sleep apnea. This kind is caused by a blocked or collapsed airway.  Central sleep apnea. This kind happens when the part of the brain that controls breathing does not send the correct signals to the muscles that control breathing.  Mixed sleep apnea. This is a combination of obstructive and central sleep apnea. What are the causes? The most common cause of this condition is a collapsed or blocked airway. An airway can collapse or become blocked if:  Your throat muscles are abnormally relaxed.  Your tongue and tonsils are larger than normal.  You are overweight.  Your airway is smaller than normal. What increases the risk? This condition is more likely to develop in people who:  Are overweight.  Smoke.  Have a smaller than normal airway.  Are elderly.  Are male.  Drink alcohol.  Take sedatives or tranquilizers.  Have a family history of sleep apnea. What are the signs or symptoms? Symptoms of this condition include:  Trouble staying asleep.  Daytime sleepiness and tiredness.  Irritability.  Loud snoring.  Morning headaches.  Trouble concentrating.  Forgetfulness.  Decreased interest in sex.  Unexplained sleepiness.  Mood swings.  Personality changes.  Feelings of depression.  Waking up often during the night to  urinate.  Dry mouth.  Sore throat. How is this diagnosed? This condition may be diagnosed with:  A medical history.  A physical exam.  A series of tests that are done while you are sleeping (sleep study). These tests are usually done in a sleep lab, but they may also be done at home. How is this treated? Treatment for this condition aims to restore normal breathing and to ease symptoms during sleep. It may involve managing health issues that can affect breathing, such as high blood pressure or obesity. Treatment may include:  Sleeping on your side.  Using a decongestant if you have nasal congestion.  Avoiding the use of depressants, including alcohol, sedatives, and narcotics.  Losing weight if you are overweight.  Making changes to your diet.  Quitting smoking.  Using a device to open your airway while you sleep, such as: ? An oral appliance. This is a custom-made mouthpiece that shifts your lower jaw forward. ? A continuous positive airway pressure (CPAP) device. This device delivers oxygen to your airway through a mask. ? A nasal expiratory positive airway pressure (EPAP) device. This device has valves that you put into each nostril. ? A bi-level positive airway pressure (BPAP) device. This device delivers oxygen to your airway through a mask.  Surgery if other treatments do not work. During surgery, excess tissue is removed to create a wider airway. It is important to get treatment for sleep apnea. Without treatment, this condition can lead to:  High blood pressure.  Coronary artery disease.  (Men) An inability to achieve or maintain an erection (  impotence).  Reduced thinking abilities. Follow these instructions at home:  Make any lifestyle changes that your health care provider recommends.  Eat a healthy, well-balanced diet.  Take over-the-counter and prescription medicines only as told by your health care provider.  Avoid using depressants, including alcohol,  sedatives, and narcotics.  Take steps to lose weight if you are overweight.  If you were given a device to open your airway while you sleep, use it only as told by your health care provider.  Do not use any tobacco products, such as cigarettes, chewing tobacco, and e-cigarettes. If you need help quitting, ask your health care provider.  Keep all follow-up visits as told by your health care provider. This is important. Contact a health care provider if:  The device that you received to open your airway during sleep is uncomfortable or does not seem to be working.  Your symptoms do not improve.  Your symptoms get worse. Get help right away if:  You develop chest pain.  You develop shortness of breath.  You develop discomfort in your back, arms, or stomach.  You have trouble speaking.  You have weakness on one side of your body.  You have drooping in your face. These symptoms may represent a serious problem that is an emergency. Do not wait to see if the symptoms will go away. Get medical help right away. Call your local emergency services (911 in the [place] Do not drive yourself to the hospital. This information is not intended to replace advice given to you by your health care provider. Make sure you discuss any questions you have with your health care provider. Document Released: 02/06/2002 Document Revised: 09/14/2016 Document Reviewed: 11/26/2014 Elsevier Interactive Patient Education  2019 [employer].

## 2018-04-07 NOTE — Progress Notes (Signed)
PATIENT: Julian Oneal DOB: May 10, 1965  REASON FOR VISIT: follow up HISTORY FROM: patient  Chief Complaint  Patient presents with  . Follow-up    He reports he uses CPAP every night. He can't really see a big difference with it. He reports an average of 6 hours every night.  . Room 5    here alone      HISTORY OF PRESENT ILLNESS: Today 04/07/18 Julian Oneal is a 53 y.o. male here today for follow up. His CPAP download indicates that he use his machine 30 out of 30 days for compliance of 100%.  He uses machine greater than 4 hours 28 out of 30 days for compliance of 93%.  He uses his machine on average 5 hours and 21 minutes each night.  His residual AHI is 0.5 on 6 cm of water. There is no significant leak. The patient continues on trazodone for insomnia. He reports that he continues to have trouble with insomnia. He is tolerating medications well. He  He is not happy with CPAP. He does not feel that this is helping him at all and is requesting to repeat sleep study. He reports a weight loss of about 20-25 pounds since last study in 2008.   HISTORY: (copied from [place] note on 04/06/2017) Julian Oneal is a 53 year old male with a history of obstructive sleep apnea on CPAP, insomnia and migraine headaches.  He returns today for follow-up.  His CPAP download indicates that he use his machine 30 out of 30 days for compliance of 100%.  He uses machine greater than 4 hours 24 out of 30 days for compliance of 80%.  He uses his machine on average 5 hours and 28 minutes each night.  His residual AHI is 0.9 on 6 cm of water.  The patient continues on trazodone for insomnia.  He reports that this works fairly well for him.  The patient states that he was recently in a migraine study for injections.  He states that that works well for him.  His primary care recently prescribed Emgality.  He reports that he had a loading dose and will be due for his next injection next month.  He reports that this  is working well for him.  He does state that he gets the medication for free for the next 12 months with his discount card.  He reports when his prescription is renewed he would prefer that we prescribe medication in case it is not approved by his insurance.  I am amenable to this as long as his primary care is amenable.  REVIEW OF SYSTEMS: Out of a complete 14 system review of symptoms, the patient complains only of the following symptoms, insomnia and all other reviewed systems are negative.  EPWORTH SLEEPINESS SCALE: 9  ALLERGIES: Allergies  Allergen Reactions  . Amitriptyline     headaches  . Silenor [Doxepin Hcl]     headaches  . Tribenzor [Olmesartan-Amlodipine-Hctz]     headaches  . Topiramate     Severe headaches, memory loss    HOME MEDICATIONS: Outpatient Medications Prior to Visit  Medication Sig Dispense Refill  . cetirizine (ZYRTEC) 10 MG tablet Take 10 mg by mouth daily as needed.     Marland Kitchen CIALIS 5 MG tablet   11  . diltiazem (DILACOR XR) 120 MG 24 hr capsule Take 1 capsule (120 mg total) by mouth daily. 90 capsule 1  . fenofibrate 160 MG tablet Take 160 mg by mouth  daily.    . folic acid (FOLVITE) 209 MCG tablet Take 400 mcg by mouth daily.    Marland Kitchen Galcanezumab-gnlm (EMGALITY) 120 MG/ML SOAJ Inject into the skin. Taking one injection every 30 days.    . hydrochlorothiazide (HYDRODIURIL) 25 MG tablet Take 25 mg by mouth daily.    Marland Kitchen MYDAYIS 37.5 MG CP24 Take 1 capsule by mouth daily.    Marland Kitchen omeprazole (PRILOSEC) 20 MG capsule Take 20 mg by mouth daily.    . traZODone (DESYREL) 150 MG tablet TAKE 1 TABLET BY MOUTH EVERYDAY AT BEDTIME 90 tablet 2  . valsartan (DIOVAN) 160 MG tablet TAKE 1 TABLET (160 MG TOTAL) BY MOUTH DAILY. 90 tablet 0   No facility-administered medications prior to visit.     PAST MEDICAL HISTORY: Past Medical History:  Diagnosis Date  . Bell's palsy 2008   Dr Jannifer Franklin  . Headache(784.0)   . High cholesterol   . Hypertension   . Insomnia   . OSA on  CPAP   . Taste absent     PAST SURGICAL HISTORY: Past Surgical History:  Procedure Laterality Date  . CHOLECYSTECTOMY  2003  . FOOT SURGERY  2008  . HERNIA REPAIR  [phone]  . MOUTH SURGERY      FAMILY HISTORY: Family History  Problem Relation Age of Onset  . Cancer - Prostate Father   . Parkinson's disease Maternal Uncle   . Alzheimer's disease Paternal Aunt        in their late 60's  . Alzheimer's disease Paternal Uncle        in their late 46's  . Cancer - Prostate Maternal Grandfather   . Prostate cancer Maternal Grandfather   . Allergies Sister   . Heart disease Maternal Grandmother   . Heart disease Paternal Grandmother     SOCIAL HISTORY: Social History   Socioeconomic History  . Marital status: Married    Spouse name: Not on file  . Number of children: 0  . Years of education: College  . Highest education level: Not on file  Occupational History  . Occupation: fireman    Comment: Geophysical data processor: [employer]  Social Needs  . Financial resource strain: Not on file  . Food insecurity:    Worry: Not on file    Inability: Not on file  . Transportation needs:    Medical: Not on file    Non-medical: Not on file  Tobacco Use  . Smoking status: Former Smoker    Packs/day: 0.25    Years: 5.00    Pack years: 1.25    Types: Cigarettes    Last attempt to quit: 03/02/1990    Years since quitting: 28.1  . Smokeless tobacco: Never Used  Substance and Sexual Activity  . Alcohol use: Yes    Alcohol/week: 0.0 standard drinks    Comment: occasionally  . Drug use: No  . Sexual activity: Not on file  Lifestyle  . Physical activity:    Days per week: Not on file    Minutes per session: Not on file  . Stress: Not on file  Relationships  . Social connections:    Talks on phone: Not on file    Gets together: Not on file    Attends religious service: Not on file    Active member of club or organization: Not on  file    Attends meetings of clubs or organizations: Not on file    Relationship status:  Not on file  . Intimate partner violence:    Fear of current or ex partner: Not on file    Emotionally abused: Not on file    Physically abused: Not on file    Forced sexual activity: Not on file  Other Topics Concern  . 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 pt denies any illegal drugs, tobacco use(quit in 1992), consumes alcohol occasionally and consumes coffee.      PHYSICAL EXAM  Vitals:   04/07/18 0714  BP: 125/65  Pulse: 91  Weight: 157 lb (71.2 kg)  Height: _0  (1.778 m)   Body mass index is 22.53 kg/m.  Generalized: Well developed, in no acute distress  Cardiology: normal rate and rhythm, no murmur noted Neurological examination  Mentation: Alert oriented to time, place, history taking. Follows all commands speech and language fluent Cranial nerve II-XII: Pupils were equal round reactive to light. Extraocular movements were full, visual field were full on confrontational test. Facial sensation and strength were normal. Mild left facial droop from previous Bells Palsy. Uvula tongue midline. Head turning and shoulder shrug  were normal and symmetric. Motor: The motor testing reveals 5 over 5 strength of all 4 extremities. Good symmetric motor tone is noted throughout.  Sensory: Sensory testing is intact to soft touch on all 4 extremities. No evidence of extinction is noted.  Coordination: Cerebellar testing reveals good finger-nose-finger and heel-to-shin bilaterally.  Gait and station: Gait is normal. .   DIAGNOSTIC DATA (LABS, IMAGING, TESTING) - I reviewed patient records, labs, notes, testing and imaging myself where available.  No flowsheet data found.   Lab Results  Component Value Date   HGB 15.1 02/08/2008      Component  Value Date/Time   NA 138 02/06/2008 1045   K 3.9 02/06/2008 1045   CL 103 02/06/2008 1045   CO2 30 02/06/2008 1045   GLUCOSE 93 02/06/2008 1045   BUN 9 02/06/2008 1045   CREATININE 0.96 02/06/2008 1045   CALCIUM 9.6 02/06/2008 1045   GFRNONAA >60 02/06/2008 1045   GFRAA  02/06/2008 1045    >60        The eGFR has been calculated using the MDRD equation. This calculation has not been validated in all clinical   No results found for: CHOL, HDL, LDLCALC, LDLDIRECT, TRIG, CHOLHDL No results found for: HGBA1C No results found for: VITAMINB12 No results found for: TSH     ASSESSMENT AND PLAN 53 y.o. year old male  has a past medical history of Bell's palsy (2008), Headache(784.0), High cholesterol, Hypertension, Insomnia, OSA on CPAP, and Taste absent. here with     ICD-10-CM   1. Sleep apnea with use of continuous positive airway pressure (CPAP) G47.30   2. Insomnia, persistent G47.00     We will schedule patient for repeat sleep study per patient request. Last study in 2008. He has lost about 20-25 pounds since last study. We will continue trazodone for insomnia with anticipation of adjustment pending sleep study. He was encouraged to continue compliance with CPAP usage. Nightly use and greater than 4 hours each night recommended. We have discussed different causes of sleep apnea as well as risks associated with noncompliance. He verbalizes understanding.    No orders of the defined types were placed in this encounter.    No orders of the defined types  were placed in this encounter.      Debbora Presto, FNP-C 04/07/2018, 8:31 AM [facility] [address], [place], [place] [address] [phone]

## 2018-04-18 ENCOUNTER — Ambulatory Visit (INDEPENDENT_AMBULATORY_CARE_PROVIDER_SITE_OTHER): Payer: 59 | Admitting: Neurology

## 2018-04-18 DIAGNOSIS — Z9989 Dependence on other enabling machines and devices: Principal | ICD-10-CM

## 2018-04-18 DIAGNOSIS — G4733 Obstructive sleep apnea (adult) (pediatric): Secondary | ICD-10-CM | POA: Diagnosis not present

## 2018-04-20 NOTE — Procedures (Signed)
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 His CPAP download indicates that he use his machine 30 out of 30 days for compliance of 100%.  He uses machine greater than 4 hours 28 out of 30 days for compliance of 93%.  He uses his machine on average 5 hours and 21 minutes each night.  His residual AHI is 0.5 on 6 cm of water. There is no significant leak. The patient continues on trazodone for insomnia.  He reports that he continues to have trouble with insomnia. He is tolerating medications well. He is not happy with CPAP. He does not feel that this is helping him at all and is requesting to repeat sleep study. He reports a weight loss of about 20-25 pounds since last study in 2008. Epworth Sleepiness score: 9/24-  BMI: 22.4 kg/m2.  STUDY RESULTS:   Total Recording Time: 8 h, 3 mins; Total Sleep Time:  5 h  46 mins Total Apnea/Hypopnea Index (AHI):  3.7/h; RDI:  5.0/h; REM AHI:  12.1/h Average Oxygen Saturation:  95 %; Lowest Oxygen Desaturation:  92 %  Total Time Oxygen Saturation Below or at 88 %:  0.0 minutes  Average Heart Rate:   61 bpm (between 49 and 98 bpm) IMPRESSION:  There is no longer significant sleep apnea seen- no hypoxemia. Mild to moderate snoring.   RECOMMENDATION: CPAP can be discontinued.  I certify that I have reviewed the raw data recording prior to the issuance of this report in accordance with the standards of the American Academy of Sleep Medicine (AASM). Larey Seat, M.D.   04-20-2018   Medical Director of [facility] at [facility], accredited by the AASM. Diplomat of the ABPN and ABSM.

## 2018-04-21 ENCOUNTER — Telehealth: Payer: Self-pay | Admitting: Neurology

## 2018-04-21 DIAGNOSIS — G4730 Sleep apnea, unspecified: Secondary | ICD-10-CM

## 2018-04-21 NOTE — Telephone Encounter (Signed)
-----   Message from Larey Seat, MD sent at 04/20/2018  5:06 PM EST ----- IMPRESSION: There is no longer significant sleep apnea seen- no  hypoxemia. Mild to moderate snoring.  RECOMMENDATION: CPAP can be discontinued.

## 2018-04-21 NOTE — Telephone Encounter (Signed)
Called and advised the patient that there was no evidence of sleep apnea found on the HST that was completed. Based off this study Dr Brett Fairy states the patient can DC CPAP. Pt verbalized understanding. I will send an order to dc to [facility] for the patient

## 2018-05-09 ENCOUNTER — Other Ambulatory Visit: Payer: Self-pay | Admitting: Neurology

## 2018-05-23 ENCOUNTER — Other Ambulatory Visit: Payer: Self-pay

## 2018-05-23 MED ORDER — VALSARTAN 160 MG PO TABS: ORAL_TABLET | ORAL | 0 refills | Status: DC

## 2018-06-09 ENCOUNTER — Telehealth: Payer: Self-pay

## 2018-06-09 NOTE — Telephone Encounter (Signed)
YOUR CARDIOLOGY TEAM HAS ARRANGED FOR AN E-VISIT FOR YOUR APPOINTMENT - PLEASE REVIEW IMPORTANT INFORMATION BELOW SEVERAL DAYS PRIOR TO YOUR APPOINTMENT  Due to the recent COVID-19 pandemic, we are transitioning in-person office visits to tele-medicine visits in an effort to decrease unnecessary exposure to our patients and staff. Medicare and most insurances are covering these visits without a copay needed. We also encourage you to sign up for MyChart if you have not already done so. You will need a smartphone if possible. For patients that do not have this, we can still complete the visit using a regular telephone but do prefer a smartphone to enable video when possible. You may have a close family member that lives with you that can help. If possible, we also ask that you have a blood pressure cuff and scale at home to measure your blood pressure, heart rate and weight prior to your scheduled appointment. Patients with clinical needs that need an in-person evaluation and testing will still be able to come to the office if absolutely necessary. If you have any questions, feel free to call our office.     2-3 DAYS BEFORE YOUR APPOINTMENT  You will receive a telephone call from one of our [place] team members - your caller ID may say "Unknown caller." If this is a video visit, we will confirm that you have been able to download the WebEx app. We will remind you check your blood pressure, heart rate and weight prior to your scheduled appointment. If you have an [facility] Watch or Kardia, please upload any pertinent ECG strips the day before or morning of your appointment to [facility]. Our staff will also make sure you have reviewed the consent and agree to move forward with your scheduled tele-health visit.     THE DAY OF YOUR APPOINTMENT  Approximately 15 minutes prior to your scheduled appointment, you will receive a telephone call from one of [facility] team - your caller ID may say "Unknown caller."   Our staff will confirm medications, vital signs for the day and any symptoms you may be experiencing. Please have this information available prior to the time of visit start. It may also be helpful for you to have a pad of paper and pen handy for any instructions given during your visit. They will also walk you through joining the WebEx smartphone meeting if this is a video visit.    CONSENT FOR TELE-HEALTH VISIT - PLEASE REVIEW  I hereby voluntarily request, consent and authorize [facility] and its employed or contracted physicians, physician assistants, nurse practitioners or other licensed health care professionals (the Practitioner), to provide me with telemedicine health care services (the "Services") as deemed necessary by the treating Practitioner. I acknowledge and consent to receive the Services by the Practitioner via telemedicine. I understand that the telemedicine visit will involve communicating with the Practitioner through live audiovisual communication technology and the disclosure of certain medical information by electronic transmission. I acknowledge that I have been given the opportunity to request an in-person assessment or other available alternative prior to the telemedicine visit and am voluntarily participating in the telemedicine visit.  I understand that I have the right to withhold or withdraw my consent to the use of telemedicine in the course of my care at any time, without affecting my right to future care or treatment, and that the Practitioner or I may terminate the telemedicine visit at any time. I understand that I have the right to inspect all information obtained  and/or recorded in the course of the telemedicine visit and may receive copies of available information for a reasonable fee.  I understand that some of the potential risks of receiving the Services via telemedicine include:  Marland Kitchen Delay or interruption in medical evaluation due to technological equipment  failure or disruption; . Information transmitted may not be sufficient (e.g. poor resolution of images) to allow for appropriate medical decision making by the Practitioner; and/or  . In rare instances, security protocols could fail, causing a breach of personal health information.  Furthermore, I acknowledge that it is my responsibility to provide information about my medical history, conditions and care that is complete and accurate to the best of my ability. I acknowledge that Practitioner's advice, recommendations, and/or decision may be based on factors not within their control, such as incomplete or inaccurate data provided by me or distortions of diagnostic images or specimens that may result from electronic transmissions. I understand that the practice of medicine is not an exact science and that Practitioner makes no warranties or guarantees regarding treatment outcomes. I acknowledge that I will receive a copy of this consent concurrently upon execution via email to the email address I last provided but may also request a printed copy by calling the office of [place].    I understand that my insurance will be billed for this visit.   I have read or had this consent read to me. . I understand the contents of this consent, which adequately explains the benefits and risks of the Services being provided via telemedicine.  . I have been provided ample opportunity to ask questions regarding this consent and the Services and have had my questions answered to my satisfaction. . I give my informed consent for the services to be provided through the use of telemedicine in my medical care  By participating in this telemedicine visit I agree to the above.

## 2018-06-09 NOTE — Telephone Encounter (Signed)
Spoke with patient who is agreeable to Doximity for virtual visit with Dr. Johnsie Cancel on 4/16 at 8:15 am. Pt has been made aware that he will receive a call 30 minutes before schedule appt time and to have BP and HR available. Pt was instructed on the steps to take to use doximity. Pt verbalized understanding and thanked me for the call.

## 2018-06-10 NOTE — Progress Notes (Signed)
Virtual Visit via Video Note   This visit type was conducted due to national recommendations for restrictions regarding the COVID-19 Pandemic (e.g. social distancing) in an effort to limit this patient's exposure and mitigate transmission in our community.  Due to his co-morbid illnesses, this patient is at least at moderate risk for complications without adequate follow up.  This format is felt to be most appropriate for this patient at this time.  All issues noted in this document were discussed and addressed.  A limited physical exam was performed with this format.  Please refer to the patient's chart for his consent to telehealth for Julian Oneal.   Evaluation Performed:  Follow-up visit  Date:  06/10/2018   ID:  Julian Oneal, DOB 10-05-1965, MRN [id number]  Patient Location: Home  Provider Location: Office  PCP:  Mayra Neer, MD  Cardiologist:  Wynonia Lawman Electrophysiologist:  None   Chief Complaint:  Chest Pain, HTN, HLD, OSA Family history CAD  History of Present Illness:    Julian Oneal is a 53 y.o. male who presents via audio/video conferencing for a telehealth visit today.    Seen by Dr Wynonia Lawman Previous SSCP atypical with GI overtones Works as IT trainer. CRF HLD, HTN and family history . 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    Past Medical History:  Diagnosis Date  . Bell's palsy 2008   Dr Jannifer Franklin  . Headache(784.0)   . High cholesterol   . Hypertension   . Insomnia   . OSA on CPAP   . Taste absent    Past  Surgical History:  Procedure Laterality Date  . CHOLECYSTECTOMY  2003  . FOOT SURGERY  2008  . HERNIA REPAIR  [phone]  . MOUTH SURGERY       No outpatient medications have been marked as taking for the 06/14/18 encounter (Appointment) with Josue Hector, MD.     Allergies:   Amitriptyline; Silenor [doxepin hcl]; Tribenzor [olmesartan-amlodipine-hctz]; and Topiramate   Social History   Tobacco Use  . Smoking status: Former Smoker    Packs/day: 0.25    Years: 5.00    Pack years: 1.25    Types: Cigarettes    Last attempt to quit: 03/02/1990    Years since quitting: 28.2  . Smokeless tobacco: Never Used  Substance Use Topics  . Alcohol use: Yes    Alcohol/week: 0.0 standard drinks    Comment: occasionally  . Drug use: No     Family Hx: The patient's family history includes Allergies in his sister; Alzheimer's disease in his paternal aunt and paternal uncle; Cancer - Prostate in his father and maternal grandfather; Heart disease in his maternal grandmother and paternal grandmother; Parkinson's disease in his maternal uncle; Prostate cancer in his maternal grandfather.  ROS:   Please see the history of present illness.      All other systems reviewed and are negative.   Prior CV studies:   The following studies were reviewed today:  Cardiac CTA 2017  Labs/Other Tests  and Data Reviewed:    EKG:  No ECG reviewed.  Recent Labs: No results found for requested labs within last 8760 hours.   Recent Lipid Panel No results found for: CHOL, TRIG, HDL, CHOLHDL, LDLCALC, LDLDIRECT  Wt Readings from Last 3 Encounters:  04/07/18 71.2 kg  04/06/17 73.6 kg  04/06/16 81.8 kg     Objective:    Vital Signs:  There were no vitals taken for this visit.   Well nourished, well developed male in no acute distress. Skin warm and dry No JVP elevation No tachypnea No edema  ASSESSMENT & PLAN:    1. Chest Pain: atypical CRF;s HLD, family history and HTN. Calcium score 0  normal right dominant cors by CT 2017 will repeat calcium score when COVID restrictions done  2. HTN:  Well controlled.  Continue current medications and low sodium Dash type diet.   3. HLD:  Only on fenofibrate labs with primary  4. OSA:  Lost weight no longer needing CPAP !!  COVID-19 Education: The signs and symptoms of COVID-19 were discussed with the patient and how to seek care for testing (follow up with PCP or arrange E-visit).  The importance of social distancing was discussed today.  Time:   Today, I have spent 30 minutes with the patient with telehealth technology discussing the above problems.     Medication Adjustments/Labs and Tests Ordered: Current medicines are reviewed at length with the patient today.  Concerns regarding medicines are outlined above.  Tests Ordered: No orders of the defined types were placed in this encounter.  Medication Changes: No orders of the defined types were placed in this encounter.   Disposition:  Follow up in a year  Signed, Jenkins Rouge, MD  06/10/2018 1:36 PM    [facility]

## 2018-06-14 ENCOUNTER — Telehealth (INDEPENDENT_AMBULATORY_CARE_PROVIDER_SITE_OTHER): Payer: 59 | Admitting: Cardiovascular Disease

## 2018-06-14 ENCOUNTER — Other Ambulatory Visit: Payer: Self-pay

## 2018-06-14 ENCOUNTER — Encounter: Payer: Self-pay | Admitting: Cardiovascular Disease

## 2018-06-14 VITALS — BP 150/90 | HR 79 | Temp 97.8°F | Ht 70.0 in | Wt 150.0 lb

## 2018-06-14 DIAGNOSIS — E782 Mixed hyperlipidemia: Secondary | ICD-10-CM | POA: Diagnosis not present

## 2018-06-14 DIAGNOSIS — Z8249 Family history of ischemic heart disease and other diseases of the circulatory system: Secondary | ICD-10-CM

## 2018-06-14 NOTE — Patient Instructions (Addendum)
Medication Instructions:   If you need a refill on your cardiac medications before your next appointment, please call your pharmacy.   Lab work:  If you have labs (blood work) drawn today and your tests are completely normal, you will receive your results only by: Marland Kitchen MyChart Message (if you have MyChart) OR . A paper copy in the mail If you have any lab test that is abnormal or we need to change your treatment, we will call you to review the results.  Testing/Procedures: Cardiac CT scanning for calcium score in 3 months, (CAT scanning), is a noninvasive, special x-ray that produces cross-sectional images of the body using x-rays and a computer. CT scans help physicians diagnose and treat medical conditions. For some CT exams, a contrast material is used to enhance visibility in the area of the body being studied. CT scans provide greater clarity and reveal more details than regular x-ray exams.  Follow-Up: At [facility], you and your health needs are our priority.  As part of our continuing mission to provide you with exceptional heart care, we have created designated Provider Care Teams.  These Care Teams include your primary Cardiologist (physician) and Advanced Practice Providers (APPs -  Physician Assistants and Nurse Practitioners) who all work together to provide you with the care you need, when you need it. You will need a follow up appointment in  12 months.  Please call our office 2 months in advance to schedule this appointment.  You may see Dr. Johnsie Cancel or one of the following Advanced Practice Providers on your designated Care Team:   Truitt Merle, NP Cecilie Kicks, NP . Kathyrn Drown, NP

## 2018-06-15 ENCOUNTER — Other Ambulatory Visit: Payer: Self-pay | Admitting: Cardiology

## 2018-06-16 ENCOUNTER — Telehealth: Payer: 59 | Admitting: Cardiovascular Disease

## 2018-06-30 ENCOUNTER — Encounter: Payer: Self-pay | Admitting: Adult Health

## 2018-07-08 ENCOUNTER — Other Ambulatory Visit: Payer: Self-pay | Admitting: Cardiovascular Disease

## 2018-07-09 ENCOUNTER — Other Ambulatory Visit: Payer: Self-pay | Admitting: Cardiology

## 2018-07-11 NOTE — Telephone Encounter (Signed)
Rx refill sent to pharmacy. 

## 2018-09-27 ENCOUNTER — Ambulatory Visit
Admission: RE | Admit: 2018-09-27 | Discharge: 2018-09-27 | Disposition: A | Discharge status: Home / Self Care | Payer: 59 | Source: Ambulatory Visit | Attending: Family Medicine | Admitting: Family Medicine

## 2018-09-27 ENCOUNTER — Other Ambulatory Visit: Payer: Self-pay | Admitting: Family Medicine

## 2018-09-27 DIAGNOSIS — R0789 Other chest pain: Secondary | ICD-10-CM

## 2018-10-03 ENCOUNTER — Other Ambulatory Visit: Payer: Self-pay

## 2018-10-03 ENCOUNTER — Ambulatory Visit (INDEPENDENT_AMBULATORY_CARE_PROVIDER_SITE_OTHER)
Admission: RE | Admit: 2018-10-03 | Discharge: 2018-10-03 | Disposition: A | Discharge status: Home / Self Care | Payer: Self-pay | Source: Ambulatory Visit | Attending: Cardiovascular Disease | Admitting: Cardiovascular Disease

## 2018-10-03 DIAGNOSIS — Z8249 Family history of ischemic heart disease and other diseases of the circulatory system: Secondary | ICD-10-CM

## 2018-10-03 DIAGNOSIS — E782 Mixed hyperlipidemia: Secondary | ICD-10-CM

## 2018-10-17 ENCOUNTER — Other Ambulatory Visit: Payer: Self-pay | Admitting: Nurse Practitioner

## 2018-10-17 DIAGNOSIS — I340 Nonrheumatic mitral (valve) insufficiency: Secondary | ICD-10-CM

## 2018-10-25 ENCOUNTER — Ambulatory Visit (HOSPITAL_COMMUNITY): Payer: 59 | Attending: Cardiology

## 2018-10-25 ENCOUNTER — Other Ambulatory Visit: Payer: Self-pay

## 2018-10-25 DIAGNOSIS — I340 Nonrheumatic mitral (valve) insufficiency: Secondary | ICD-10-CM | POA: Diagnosis not present

## 2019-03-24 ENCOUNTER — Other Ambulatory Visit: Payer: Self-pay | Admitting: Neurology

## 2019-04-03 ENCOUNTER — Ambulatory Visit
Admission: RE | Admit: 2019-04-03 | Discharge: 2019-04-03 | Disposition: A | Discharge status: Home / Self Care | Payer: 59 | Source: Ambulatory Visit | Attending: Family Medicine | Admitting: Family Medicine

## 2019-04-03 ENCOUNTER — Other Ambulatory Visit: Payer: Self-pay | Admitting: Family Medicine

## 2019-04-03 DIAGNOSIS — R52 Pain, unspecified: Secondary | ICD-10-CM

## 2019-04-25 ENCOUNTER — Other Ambulatory Visit: Payer: Self-pay | Admitting: Neurology

## 2019-05-07 ENCOUNTER — Other Ambulatory Visit: Payer: Self-pay | Admitting: Neurology

## 2019-05-18 ENCOUNTER — Other Ambulatory Visit: Payer: Self-pay | Admitting: Neurology

## 2019-05-18 MED ORDER — TRAZODONE HCL 150 MG PO TABS: ORAL_TABLET | ORAL | 0 refills | Status: DC

## 2019-05-30 ENCOUNTER — Encounter: Payer: Self-pay | Admitting: Adult Health

## 2019-05-30 ENCOUNTER — Telehealth (INDEPENDENT_AMBULATORY_CARE_PROVIDER_SITE_OTHER): Payer: 59 | Admitting: Adult Health

## 2019-05-30 DIAGNOSIS — G4700 Insomnia, unspecified: Secondary | ICD-10-CM

## 2019-05-30 DIAGNOSIS — R519 Headache, unspecified: Secondary | ICD-10-CM | POA: Diagnosis not present

## 2019-05-30 DIAGNOSIS — G43719 Chronic migraine without aura, intractable, without status migrainosus: Secondary | ICD-10-CM

## 2019-05-30 MED ORDER — NURTEC 75 MG PO TBDP: 75.0000 mg | ORAL_TABLET | Freq: Every day | ORAL | 5 refills | Status: DC | PRN

## 2019-05-30 MED ORDER — TRAZODONE HCL 150 MG PO TABS: ORAL_TABLET | ORAL | 11 refills | Status: DC

## 2019-05-30 MED ORDER — AJOVY 225 MG/1.5ML ~~LOC~~ SOAJ: 225.0000 mg | SUBCUTANEOUS | 5 refills | Status: DC

## 2019-05-30 NOTE — Progress Notes (Signed)
PATIENT: Julian Oneal DOB: 1965-09-03  REASON FOR VISIT: follow up HISTORY FROM: patient  Virtual Visit via Video Note  I connected with Julian Oneal on 05/30/19 at  1:30 PM EDT by a video enabled telemedicine application located remotely at [facility] and verified that I am speaking with the correct person using two identifiers who was located at their own home.   I discussed the limitations of evaluation and management by telemedicine and the availability of in person appointments. The patient expressed understanding and agreed to proceed.   PATIENT: Julian Oneal DOB: 22-Mar-1965  REASON FOR VISIT: follow up HISTORY FROM: patient  HISTORY OF PRESENT ILLNESS: Today 05/30/19:  Julian Oneal is a 53 year old male with a history of daily headaches and insomnia.  He returns today for follow-up.  He did repeat a home sleep study that did not show apnea.  He no longer uses a CPAP.  He reports that he has been under a lot of stress with his job and being the caregiver of 4 people (aunt uncle mom and dad).  He reports that trazodone continues to work fairly well for him.  He reports that he continues to have almost daily headaches.  He reports 2-3 times a week he may have a severe headache that would require him to lay down.  He states that he typically tries to take Tylenol and Advil.  He does have photophobia and phonophobia with his headaches.  He has tried multiple medications in the past including Topamax, gabapentin, Depakote, Lamictal, Imitrex, Ubrevly, Emgality, tizanidine and hydrocodone.   HISTORY  04/07/18 Julian Oneal is a 54 y.o. male here today for follow up. His CPAP download indicates that he use his machine 30 out of 30 days for compliance of 100%. He uses machine greater than 4 hours 28 out of 30 days for compliance of 93%. He uses his machine on average 5 hours and 21 minutes each night. His residual AHI is 0.5 on 6 cm of water.There is no significant  leak.The patient continues on trazodone for insomnia. He reports that he continues to have trouble with insomnia. He is tolerating medications well. He  He is not happy with CPAP. He does not feel that this is helping him at all and is requesting to repeat sleep study. He reports a weight loss of about 20-25 pounds since last study in 2008.    REVIEW OF SYSTEMS: Out of a complete 14 system review of symptoms, the patient complains only of the following symptoms, and all other reviewed systems are negative.  See HPI  ALLERGIES: Allergies  Allergen Reactions  . Amitriptyline     headaches  . Silenor [Doxepin Hcl]     headaches  . Tribenzor [Olmesartan-Amlodipine-Hctz]     headaches  . Topiramate     Severe headaches, memory loss    HOME MEDICATIONS: Outpatient Medications Prior to Visit  Medication Sig Dispense Refill  . cetirizine (ZYRTEC) 10 MG tablet Take 10 mg by mouth daily as needed.     Marland Kitchen CIALIS 5 MG tablet Take 5 mg by mouth daily.   11  . DILT-XR 120 MG 24 hr capsule TAKE 1 CAPSULE BY MOUTH EVERY DAY 90 capsule 3  . fenofibrate 160 MG tablet Take 160 mg by mouth daily.    . folic acid (FOLVITE) 846 MCG tablet Take 400 mcg by mouth daily.    . hydrochlorothiazide (HYDRODIURIL) 25 MG tablet Take 25 mg by mouth  daily.    Marland Kitchen LINZESS 72 MCG capsule Take 1 tablet by mouth daily.    Marland Kitchen MYDAYIS 37.5 MG CP24 Take 1 capsule by mouth daily.    Marland Kitchen omeprazole (PRILOSEC) 20 MG capsule Take 20 mg by mouth daily.    . traMADol (ULTRAM) 50 MG tablet Take 50 mg by mouth every 6 (six) hours as needed. for pain    . traZODone (DESYREL) 150 MG tablet TAKE 1 TABLET BY MOUTH EVERYDAY AT BEDTIME 30 tablet 0  . valsartan (DIOVAN) 160 MG tablet TAKE 1 TABLET BY MOUTH EVERY DAY 90 tablet 3  . Galcanezumab-gnlm (EMGALITY) 120 MG/ML SOAJ Inject into the skin. Taking one injection every 30 days.     No facility-administered medications prior to visit.    PAST MEDICAL HISTORY: Past Medical History:    Diagnosis Date  . Bell's palsy 2008   Dr Jannifer Franklin  . Dyspnea 08/28/2014   Followed in Pulmonary clinic/ [facility]/ [facility]  - 08/28/2014  Walked RA x 3 laps @ 185 ft each stopped due to end of study, fast pace, no doe or desats    - trial off acei 08/28/2014 >> improved 10/15/2014  - PFT's  10/21/2014  FEV1 3.40 (87 % ) ratio 88  p 6 % improvement from saba with DLCO  83 % corrects to 124 % for alv volume     . Excessive daytime sleepiness 04/04/2015  . Headache(784.0)   . Headaches, cluster 06/20/2013  . High cholesterol   . Hypertension   . Insomnia   . Obstructive sleep apnea 07/05/2012   Patient diagnosed with OSA and using CPAP at 6 cm water , with residual AHI of 0.8 on 07-04-12 . [facility] is his  DME .    Marland Kitchen Persistent headaches 07/05/2012    Referred to [facility] and wellness [facility] Dr. Domingo Cocking in 2013 -    . Pneumonia, viral 06/20/2013  . Taste absent     PAST SURGICAL HISTORY: Past Surgical History:  Procedure Laterality Date  . CHOLECYSTECTOMY  2003  . FOOT SURGERY  2008  . HERNIA REPAIR  [id number]  . MOUTH SURGERY      FAMILY HISTORY: Family History  Problem Relation Age of Onset  . Cancer - Prostate Father   . Parkinson's disease Maternal Uncle   . Alzheimer's disease Paternal Aunt        in their late 100's  . Alzheimer's disease Paternal Uncle        in their late 64's  . Cancer - Prostate Maternal Grandfather   . Prostate cancer Maternal Grandfather   . Allergies Sister   . Heart disease Maternal Grandmother   . Heart disease Paternal Grandmother     SOCIAL HISTORY: Social History   Socioeconomic History  . Marital status: Married    Spouse name: Not on file  . Number of children: 0  . Years of education: College  . Highest education level: Not on file  Occupational History  . 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 Smoking status: Former Smoker    Packs/day: 0.25     Years: 5.00    Pack years: 1.25    Types: Cigarettes    Quit date: 03/02/1990    Years since quitting: 29.2  . Smokeless tobacco: Never Used  Substance and Sexual Activity  . Alcohol use: Yes    Alcohol/week: 0.0 standard drinks  Comment: occasionally  . Drug use: No  . Sexual activity: Not on file  Other Topics Concern  . 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 pt denies any illegal drugs, tobacco use(quit in 1992), consumes alcohol occasionally and consumes coffee.   Social Determinants of Health   Financial Resource Strain:   . Difficulty of Paying Living Expenses:   Food Insecurity:   . Worried About Charity fundraiser in the Last Year:   . Arboriculturist in the Last Year:   Transportation Needs:   . Film/video editor (Medical):   Marland Kitchen Lack of Transportation (Non-Medical):   Physical Activity:   . Days of Exercise per Week:   . Minutes of Exercise per Session:   Stress:   . Feeling of Stress :   Social Connections:   . Frequency of Communication with Friends and Family:   . Frequency of Social Gatherings with Friends and Family:   . Attends Religious Services:   . Active Member of Clubs or Organizations:   . Attends Archivist Meetings:   Marland Kitchen Marital Status:   Intimate Partner Violence:   . Fear of Current or Ex-Partner:   . Emotionally Abused:   Marland Kitchen Physically Abused:   . Sexually Abused:       PHYSICAL EXAM Generalized: Well developed, in no acute distress   Neurological examination  Mentation: Alert oriented to time, place, history taking. Follows all commands speech and language fluent Cranial nerve II-XII:Extraocular movements were full. Facial symmetry noted. uvula tongue midline. Head turning and shoulder shrug  were normal and symmetric. Motor: Good strength throughout subjectively per  patient Sensory: Sensory testing is intact to soft touch on all 4 extremities subjectively per patient Coordination: Cerebellar testing reveals good finger-nose-finger  Gait and station: Patient is able to stand from a seated position. gait is normal.  Reflexes: UTA  DIAGNOSTIC DATA (LABS, IMAGING, TESTING) - I reviewed patient records, labs, notes, testing and imaging myself where available.  Lab Results  Component Value Date   HGB 15.1 02/08/2008      Component Value Date/Time   NA 138 02/06/2008 1045   K 3.9 02/06/2008 1045   CL 103 02/06/2008 1045   CO2 30 02/06/2008 1045   GLUCOSE 93 02/06/2008 1045   BUN 9 02/06/2008 1045   CREATININE 0.96 02/06/2008 1045   CALCIUM 9.6 02/06/2008 1045   GFRNONAA >60 02/06/2008 1045   GFRAA  02/06/2008 1045    >60        The eGFR has been calculated using the MDRD equation. This calculation has not been validated in all clinical     ASSESSMENT AND PLAN 54 y.o. year old male  has a past medical history of Bell's palsy (2008), Dyspnea (08/28/2014), Excessive daytime sleepiness (04/04/2015), Headache(784.0), Headaches, cluster (06/20/2013), High cholesterol, Hypertension, Insomnia, Obstructive sleep apnea (07/05/2012), Persistent headaches (07/05/2012), Pneumonia, viral (06/20/2013), and Taste absent. here with:  1.  Insomnia  -Continue trazodone  2.  Daily headache  -Ajovy ordered -Nurtec 75 mg ordered as an abortive therapy -May consider Botox therapy if headaches do not improve -Encouraged patient to keep a headache journal   I spent 30 minutes of face-to-face and non-face-to-face time with patient.  This included previsit chart review, lab review, study review, order entry, electronic health record documentation, patient education.    Jinny Blossom  Clabe Seal, MSN, NP-C 05/30/2019, 1:57 PM [facility] [address], [place], [place] [address] [phone]

## 2019-05-30 NOTE — Patient Instructions (Signed)
Start Ajovy- injection every 30 days Nurtec 75 mg ordered. Take at the onset of migraine- 1 tab in 24 hours Continue Trazodone

## 2019-05-31 ENCOUNTER — Telehealth: Payer: Self-pay | Admitting: *Deleted

## 2019-05-31 NOTE — Telephone Encounter (Signed)
Ajovy PA, key: [id number], R51.9, failed: topamax, gabapentin, depakote, Emgality, imitrex, lamictal, tizanidine, Ubrelvy, hydrocodone. Your information has been sent to [facility]

## 2019-06-01 ENCOUNTER — Encounter: Payer: Self-pay | Admitting: *Deleted

## 2019-06-01 NOTE — Telephone Encounter (Signed)
Sent my chart and advised he try the [employer] discount card, call for any problems.

## 2019-06-01 NOTE — Telephone Encounter (Signed)
I added migraine has diagnosis code

## 2019-06-01 NOTE — Telephone Encounter (Signed)
Julian Oneal was denied: did not meet FDA requirements, is not approved for unspecified headache.

## 2019-06-18 NOTE — Progress Notes (Signed)
Cardiology Office Note   Date:  06/22/2019   ID:  [name], DOB 12-03-1965, MRN [id number]  PCP:  Mayra Neer, MD  Cardiologist: Dr. Johnsie Cancel, MD   Chief Complaint  Patient presents with  . Follow-up    History of Present Illness: Julian Oneal is a 54 y.o. male who presents for 1 year follow-up, seen for Dr. Johnsie Cancel.   [name] was previously followed by Dr. Wynonia Lawman for complaints of atypical chest pain.  Has a history of hyperlipidemia, hypertension and family history of CAD.  Also has history of OSA with CPAP use.  Given his prior symptoms, cardiac CTA was performed 09/19/2015 which showed a calcium score of 0 and normal right dominant coronary arteries.  He was seen at [facility] for known symptoms at which time an abdominal CT was performed with no evidence of recurrent right inguinal or ventral hernia  Works as a Airline pilot. Was last seen by Dr. Johnsie Cancel 06/14/2018 at which time he had lost approximately 25 pounds and was no longer needing CPAP.  Today he reports that he is doing well from CV standpoint. Has been working with his PCP for adult ADHD. They have made several medication changes so far without much improvement. He has been under a lot of stress with his job and caring for elderly parents. HR is elevated today, likely due to new medication. We discussed increasing his diltiazem. HRs have been in the low 90's at baseline. He denies chest pain, SOB, LE edema, palpitations, orthopnea, dizziness or syncope.    Past Medical History:  Diagnosis Date  . Bell's palsy 2008   Dr Jannifer Franklin  . Dyspnea 08/28/2014   Followed in Pulmonary clinic/ [facility]/ [facility]  - 08/28/2014  Walked RA x 3 laps @ 185 ft each stopped due to end of study, fast pace, no doe or desats    - trial off acei 08/28/2014 >> improved 10/15/2014  - PFT's  10/21/2014  FEV1 3.40 (87 % ) ratio 88  p 6 % improvement from saba with DLCO  83 % corrects to 124 % for alv volume     . Excessive daytime  sleepiness 04/04/2015  . Headache(784.0)   . Headaches, cluster 06/20/2013  . High cholesterol   . Hypertension   . Insomnia   . Obstructive sleep apnea 07/05/2012   Patient diagnosed with OSA and using CPAP at 6 cm water , with residual AHI of 0.8 on 07-04-12 . [facility] is his  DME .    Marland Kitchen Persistent headaches 07/05/2012    Referred to Headaches and wellness center Dr. Domingo Cocking in 2013 -    . Pneumonia, viral 06/20/2013  . Taste absent     Past Surgical History:  Procedure Laterality Date  . CHOLECYSTECTOMY  2003  . FOOT SURGERY  2008  . HERNIA REPAIR  [id number]  . MOUTH SURGERY       Current Outpatient Medications  Medication Sig Dispense Refill  . cetirizine (ZYRTEC) 10 MG tablet Take 10 mg by mouth daily as needed.     Marland Kitchen CIALIS 5 MG tablet Take 5 mg by mouth daily.   11  . fenofibrate 160 MG tablet Take 160 mg by mouth daily.    . folic acid (FOLVITE) A999333 MCG tablet Take 400 mcg by mouth daily.    . Fremanezumab-vfrm (AJOVY) 225 MG/1.5ML SOAJ Inject 225 mg into the skin every 30 (thirty) days. 1.5 mL 5  . hydrochlorothiazide (HYDRODIURIL) 25 MG tablet  Take 25 mg by mouth daily.    Marland Kitchen LINZESS 72 MCG capsule Take 1 tablet by mouth daily.    Marland Kitchen MYDAYIS 37.5 MG CP24 Take 1 capsule by mouth daily.    Marland Kitchen omeprazole (PRILOSEC) 20 MG capsule Take 20 mg by mouth daily.    . traMADol (ULTRAM) 50 MG tablet Take 50 mg by mouth every 6 (six) hours as needed. for pain    . traZODone (DESYREL) 150 MG tablet TAKE 1 TABLET BY MOUTH EVERYDAY AT BEDTIME 30 tablet 11  . valsartan (DIOVAN) 160 MG tablet TAKE 1 TABLET BY MOUTH EVERY DAY 90 tablet 3  . diltiazem (DILT-XR) 240 MG 24 hr capsule Take 1 capsule (240 mg total) by mouth daily. 90 capsule 3   No current facility-administered medications for this visit.    Allergies:   Amitriptyline, Silenor [doxepin hcl], Tribenzor [olmesartan-amlodipine-hctz], and Topiramate    Social History:  The patient  reports that he quit smoking about 29 years ago. His  smoking use included cigarettes. He has a 1.25 pack-year smoking history. He has never used smokeless tobacco. He reports current alcohol use. He reports that he does not use drugs.   Family History:  The patient's family history includes Allergies in his sister; Alzheimer's disease in his paternal aunt and paternal uncle; Cancer - Prostate in his father and maternal grandfather; Heart disease in his maternal grandmother and paternal grandmother; Parkinson's disease in his maternal uncle; Prostate cancer in his maternal grandfather.    ROS:  Please see the history of present illness. Otherwise, review of systems are positive for none.  All other systems are reviewed and negative.    PHYSICAL EXAM: VS:  BP 110/70   Pulse 92   Ht 5\' 10"  (1.778 m)   Wt 156 lb (70.8 kg)   SpO2 99%   BMI 22.38 kg/m  , BMI Body mass index is 22.38 kg/m.   General: Well developed, well nourished, NAD Neck: Negative for carotid bruits. No JVD Lungs:Clear to ausculation bilaterally. No wheezes, rales, or rhonchi. Breathing is unlabored. Cardiovascular: RRR with S1 S2. No murmurs Extremities: No edema. Radial pulses 2+ bilaterally Neuro: Alert and oriented. No focal deficits. No facial asymmetry. MAE spontaneously. Psych: Responds to questions appropriately with normal affect.     EKG:  EKG is ordered today. The ekg ordered today demonstrates NSR with HR 85bpm and no acute changes    Recent Labs: No results found for requested labs within last 8760 hours.    Lipid Panel No results found for: CHOL, TRIG, HDL, CHOLHDL, VLDL, LDLCALC, LDLDIRECT    Wt Readings from Last 3 Encounters:  06/22/19 156 lb (70.8 kg)  06/14/18 150 lb (68 kg)  04/07/18 157 lb (71.2 kg)     Other studies Reviewed: Additional studies/ records that were reviewed today include:   Echocardiogram 10/25/18:  1. The left ventricle has normal systolic function with an ejection  fraction of 60-65%. The cavity size was normal. Left  ventricular diastolic  parameters were normal.  2. The right ventricle has normal systolic function. The cavity was  mildly enlarged. There is no increase in right ventricular wall thickness.   ASSESSMENT AND PLAN:  1.  History of chest pain: -Felt to be atypical in nature with cardiac risk factors including HLD, family history of CAD and hypertension -Cardiac CTA performed in 2017 with calcium score of 0 -Plan is to reperform calcium score -Denies recurrent symptoms today  -Plan for echo with next appointment  2.  Hypertension: -Stable, 110/70 -Increase diltiazem to 240mg  PO QD due to baseline tachycardia   3.  HLD: -On fenofibrate -Follows closely with PCP  4.  OSA: -Weight loss of 25 pounds last year resulting in no further need for CPAP   Current medicines are reviewed at length with the patient today.  The patient does not have concerns regarding medicines.  The following changes have been made:  Increase diltiazem to 240  Labs/ tests ordered today include: None   Orders Placed This Encounter  Procedures  . EKG 12-Lead   Disposition:   FU with Dr. Johnsie Cancel in 1 year  Signed, Kathyrn Drown, NP  06/22/2019 4:42 PM    [facility] [place], [place], [place]  [address] Phone: [phone]; Fax: [phone]

## 2019-06-22 ENCOUNTER — Ambulatory Visit: Payer: 59 | Admitting: Cardiology

## 2019-06-22 ENCOUNTER — Encounter: Payer: Self-pay | Admitting: Cardiology

## 2019-06-22 ENCOUNTER — Other Ambulatory Visit: Payer: Self-pay

## 2019-06-22 VITALS — BP 110/70 | HR 92 | Ht 70.0 in | Wt 156.0 lb

## 2019-06-22 DIAGNOSIS — Z8249 Family history of ischemic heart disease and other diseases of the circulatory system: Secondary | ICD-10-CM

## 2019-06-22 DIAGNOSIS — I10 Essential (primary) hypertension: Secondary | ICD-10-CM

## 2019-06-22 DIAGNOSIS — E782 Mixed hyperlipidemia: Secondary | ICD-10-CM | POA: Diagnosis not present

## 2019-06-22 MED ORDER — DILTIAZEM HCL ER 240 MG PO CP24: 240.0000 mg | ORAL_CAPSULE | Freq: Every day | ORAL | 3 refills | Status: DC

## 2019-06-22 NOTE — Patient Instructions (Signed)
Medication Instructions:   Your physician has recommended you make the following change in your medication:   1) Increase Diltiazem to 240 mg, 1 capsule by mouth once a day  *If you need a refill on your cardiac medications before your next appointment, please call your pharmacy*  Lab Work:  None ordered today  Testing/Procedures:  None ordered today  Follow-Up: At [facility], you and your health needs are our priority.  As part of our continuing mission to provide you with exceptional heart care, we have created designated Provider Care Teams.  These Care Teams include your primary Cardiologist (physician) and Advanced Practice Providers (APPs -  Physician Assistants and Nurse Practitioners) who all work together to provide you with the care you need, when you need it.  We recommend signing up for the patient portal called "MyChart".  Sign up information is provided on this After Visit Summary.  MyChart is used to connect with patients for Virtual Visits (Telemedicine).  Patients are able to view lab/test results, encounter notes, upcoming appointments, etc.  Non-urgent messages can be sent to your provider as well.   To learn more about what you can do with MyChart, go to [email or web address].    Your next appointment:   12 month(s)  The format for your next appointment:   In Person  Provider:   Jenkins Rouge, MD

## 2019-06-27 ENCOUNTER — Telehealth: Payer: Self-pay | Admitting: *Deleted

## 2019-06-27 DIAGNOSIS — M79645 Pain in left finger(s): Secondary | ICD-10-CM | POA: Insufficient documentation

## 2019-06-27 MED ORDER — RIZATRIPTAN BENZOATE 10 MG PO TBDP: 10.0000 mg | ORAL_TABLET | ORAL | 2 refills | Status: DC | PRN

## 2019-06-27 NOTE — Telephone Encounter (Signed)
Yes, I asked him prior to doing the PA.  Only imitrex.

## 2019-06-27 NOTE — Telephone Encounter (Signed)
I have spoke to pt, he did not recall about using the sumatriptan, although notes back in 2015, 2016 you prescribed.  He may not have tried as headaches were controlled next visit he came in.  He is ok to try the maxalt or if you want to do the sumatriptan.

## 2019-06-27 NOTE — Telephone Encounter (Signed)
I think he has tried triptans before. Can we ask the patient?

## 2019-06-27 NOTE — Telephone Encounter (Signed)
Lets try maxalt

## 2019-06-27 NOTE — Addendum Note (Signed)
Addended by: Brandon Melnick on: 06/27/2019 05:24 PM   Modules accepted: Orders

## 2019-06-27 NOTE — Telephone Encounter (Signed)
Inititaed PA for NURTEC 75mg  tabs on CMM KEY #[id number].  Determination pending.  Received denial.    This request was denied because you did not meet the following clinical requirements: Based on the information provided, you do not meet the established medication-specific criteria or guidelines for Nurtec at this time. The request for coverage for Ubrelvy ODT 75mg  orally disintegrating tablet, use as directed (10 per month), is denied. This decision is based on health plan criteria for Ubrelvy. This medicine is covered only if: You have documentation of a one month trial resulting in therapeutic failure, contraindication or intolerance to two of the following: (a) Almotriptan (Axert). (b) Eletriptan (Relpax). (c) Frovatriptan (Frova). (d) Naratriptan (Amerge). (e) Rizatriptan (Maxalt/Maxalt MLT). (f) Sumatriptan (Imitrex). (g) Zolmitriptan (Zomig). The information provided does not show that you meet the criteria listed above. **Please note: This medication cannot be further reviewed for the quantity limit until the above criteria have been addressed. The reason(s) [employer] did not approve this medication can be found above. This denial is based on our Nurtec drug coverage policy, in addition to any supplementary information you or your prescrin pending.

## 2019-06-27 NOTE — Telephone Encounter (Signed)
Please let him know that he will need to try one of the followings as long as he does not have any contraindication to trying triptans.  If the Imitrex was stopped because it did not offer him any benefit then we can try Maxalt.  Please ensure that he did not have any unpleasant side effects to the Imitrex. I recommend Maxalt if this does not offer him any benefit and we can try to get Nurtec or Roselyn Meier approved

## 2019-06-27 NOTE — Telephone Encounter (Signed)
Order placed for maxalt.

## 2019-07-04 ENCOUNTER — Other Ambulatory Visit: Payer: Self-pay

## 2019-07-04 ENCOUNTER — Other Ambulatory Visit: Payer: Self-pay | Admitting: Cardiovascular Disease

## 2019-07-04 ENCOUNTER — Ambulatory Visit: Payer: 59 | Attending: Orthopaedic Surgery | Admitting: Occupational Therapy

## 2019-07-04 DIAGNOSIS — M25642 Stiffness of left hand, not elsewhere classified: Secondary | ICD-10-CM | POA: Diagnosis present

## 2019-07-04 DIAGNOSIS — M25542 Pain in joints of left hand: Secondary | ICD-10-CM

## 2019-07-04 DIAGNOSIS — R600 Localized edema: Secondary | ICD-10-CM

## 2019-07-04 DIAGNOSIS — M6281 Muscle weakness (generalized): Secondary | ICD-10-CM | POA: Diagnosis present

## 2019-07-04 NOTE — Therapy (Signed)
[facility] [address] [place], [place], [address] Phone: [phone]   Fax:  [phone]  Occupational Therapy Evaluation  Patient Details  Name: Julian Oneal MRN: [id number] Date of Birth: 1965-06-29 Referring Provider (OT): Dr. Jeannie Fend   Encounter Date: 07/04/2019  OT End of Session - 07/04/19 1106    Visit Number  1    Number of Visits  7    Date for OT Re-Evaluation  08/18/19    Authorization Type  [employer]    OT Start Time  1015    OT Stop Time  1055    OT Time Calculation (min)  40 min    Activity Tolerance  Patient tolerated treatment well    Behavior During Therapy  [facility] for tasks assessed/performed       Past Medical History:  Diagnosis Date  . Bell's palsy 2008   Dr Jannifer Franklin  . Dyspnea 08/28/2014   Followed in Pulmonary clinic/ [facility]/ [facility]  - 08/28/2014  Walked RA x 3 laps @ 185 ft each stopped due to end of study, fast pace, no doe or desats    - trial off acei 08/28/2014 >> improved 10/15/2014  - PFT's  10/21/2014  FEV1 3.40 (87 % ) ratio 88  p 6 % improvement from saba with DLCO  83 % corrects to 124 % for alv volume     . Excessive daytime sleepiness 04/04/2015  . Headache(784.0)   . Headaches, cluster 06/20/2013  . High cholesterol   . Hypertension   . Insomnia   . Obstructive sleep apnea 07/05/2012   Patient diagnosed with OSA and using CPAP at 6 cm water , with residual AHI of 0.8 on 07-04-12 . [facility] is his  DME .    Marland Kitchen Persistent headaches 07/05/2012    [facility] Dr. Domingo Cocking in 2013 -    . Pneumonia, viral 06/20/2013  . Taste absent     Past Surgical History:  Procedure Laterality Date  . CHOLECYSTECTOMY  2003  . FOOT SURGERY  2008  . HERNIA REPAIR  [id number]  . MOUTH SURGERY      There were no vitals filed for this visit.  Subjective Assessment - 07/04/19 1023    Pertinent History  Lt long finger PIP sprain Jan 2021. Bell's palsy 2008 w/ subsequent  migraines    Limitations  none    Currently in Pain?  Yes    Pain Score  2    up to 7/10 yesterday   Pain Location  --   long finger   Pain Orientation  Left    Pain Descriptors / Indicators  Throbbing   yesterday, more dull today   Pain Onset  More than a month ago    Pain Frequency  Constant    Aggravating Factors   lateral pressure    Pain Relieving Factors  nothing        [facility] OT Assessment - 07/04/19 0001      Assessment   Medical Diagnosis  Lt long finger PIP sprain    Referring Provider (OT)  Dr. Jeannie Fend    Onset Date/Surgical Date  --   Jan 2021   Hand Dominance  Right    Next MD Visit  August 15, 2019      Precautions   Precautions  None      Restrictions   Weight Bearing Restrictions  No      Home  Environment   Lives With  Family      Prior Function   Level of Independence  Independent    Vocation  Full time employment   Arts administrator     ADL   Eating/Feeding  Independent    Grooming  Independent    Chiropodist - Astronomer -  Control and instrumentation engineer  Independent      IADL   Shopping  --   pain holding bags in [place], prepares and serves adequate meals independently    Investment banker, corporate own vehicle      Mobility   Mobility Status  Independent      Written Expression   Dominant Hand  Right      Observation/Other Assessments   Observations  swelling at PIP joint Lt long finger. Pt has mild stiffness of adjacent index finger. Pt also reports mild tennis elbow Lt side      Sensation   Additional Comments  Lt long finger tip numbness from previous injury      Coordination   9 Hole Peg Test  Right;Left    Right 9 Hole Peg Test  20.03 sec    Left 9  Hole Peg Test  26.75 sec      Edema   Edema  moderate PIP joint      ROM / Strength   AROM / PROM / Strength  AROM      AROM   Overall AROM Comments  BUE's AROM WNL'S except Lt long finger PIP joint - see below      Left Hand AROM   L Long  MCP 0-90  85 Degrees    L Long PIP 0-100  85 Degrees   ext = -5* (Rt = 95, 0)   L Long DIP 0-70  --   slightly limited d/t previous injury     Left Hand PROM   L Long PIP 0-100  90 Degrees      Hand Function   Right Hand Grip (lbs)  80 lbs    Right Hand 3 Point Pinch  19 lbs    Left Hand Grip (lbs)  50 lbs    Left 3 point pinch  9 lbs                      OT Education - 07/04/19 1052    Education Details  A/ROM and P/ROM HEP for Lt long finger    Person(s) Educated  Patient    Methods  Explanation;Demonstration;Handout    Comprehension  Verbalized understanding;Returned demonstration          OT Long Term Goals - 07/04/19 1215      OT LONG TERM GOAL #1   Title  Independent with HEP Lt hand for ROM and strength    Time  6    Period  Weeks    Status  On-going      OT LONG TERM GOAL #2   Title  Pt to verbalize understanding with pain management and edema management strategies    Time  6    Period  Weeks    Status  New      OT LONG TERM GOAL #3   Title  Pt to improve Lt long finger PIP flexion by 5* or more    Baseline  85* (Rt = 95*)    Time  6    Period  Weeks    Status  New      OT LONG TERM GOAL #4   Title  Pt to improve grip strength Lt hand to 65 lbs or greater for work related tasks    Baseline  Lt = 50 lbs (Rt = 85 lbs)    Time  6    Period  Weeks    Status  New      OT LONG TERM GOAL #5   Title  Pt to improve 3 tip pinch Lt hand to 13 lbs or greater for opening containers/ziplocs    Baseline  Lt = 9 lbs (Rt = 19 lbs)    Time  6    Period  Weeks    Status  New            Plan - 07/04/19 1107    Clinical Impression Statement  Pt is a 54 y.o. male who presents to outpatient O.T.  for evaulation for pain in joint of Lt finger d/t Lt long finger PIP sprain from injury in Jan 2021. Pt presents today with decreased ROM at PIP joint, decreased grip and 3 tip pinch, and increased pain and edema. Pt would benefit from O.T. to address these deficits and improve function in Lt hand.    OT Occupational Profile and History  Problem Focused Assessment - Including review of records relating to presenting problem    Occupational performance deficits (Please refer to evaluation for details):  IADL's;Work;Leisure    Body Structure / Function / Physical Skills  ROM;ADL;Dexterity;IADL;Flexibility;Strength;FMC;UE functional use;Pain    Rehab Potential  Good    Clinical Decision Making  Limited treatment options, no task modification necessary    Comorbidities Affecting Occupational Performance:  None    Modification or Assistance to Complete Evaluation   No modification of tasks or assist necessary to complete eval    OT Frequency  1x / week    OT Duration  6 weeks   plus eval   OT Treatment/Interventions  Self-care/ADL training;Therapeutic exercise;Splinting;Manual Therapy;Ultrasound;Therapeutic activities;Cryotherapy;Paraffin;DME and/or AE instruction;Compression bandaging;Fluidtherapy;Moist Heat;Contrast Bath;Passive range of motion;Patient/family education    Plan  fluido or paraffin, review ROM HEP, issue putty for grip, 3 tip pinch, and IP flex strengthening, consider compression finger sleeve for edema management    Consulted and Agree with Plan of Care  Patient       Patient will benefit from skilled therapeutic intervention in order to improve the following deficits and impairments:   Body Structure / Function / Physical Skills: ROM, ADL, Dexterity, IADL, Flexibility, Strength, FMC, UE functional use, Pain       Visit Diagnosis: Stiffness of joint, hand, left  Pain in joint of left hand  Muscle weakness (generalized)  Localized edema    Problem List Patient Active  Problem List   Diagnosis Date Noted  . Excessive daytime sleepiness 04/04/2015  . OSA on CPAP 04/04/2015  . Essential hypertension 08/29/2014  . Dyspnea 08/28/2014  . Pneumonia, viral 06/20/2013  . Headaches, cluster 06/20/2013  . Sleep apnea with use of continuous positive airway pressure (CPAP) 08/02/2012  . Insomnia, persistent 07/05/2012  . Obstructive sleep apnea 07/05/2012  . Persistent headaches 07/05/2012    Carey Bullocks, OTR/L 07/04/2019, 12:21  PM  [facility] [address] [place], [place], [address] Phone: [phone]   Fax:  [phone]  Name: Julian Oneal MRN: [id number] Date of Birth: 04-16-1965

## 2019-07-04 NOTE — Patient Instructions (Signed)
  Flexor Tendon Gliding (Active Hook Fist)   With fingers and knuckles straight, bend middle and tip joints. Do not bend large knuckles. Repeat _10-15___ times. Do _4-6___ sessions per day.  AROM: PIP Flexion / Extension    Pinch bottom knuckle of ___long_____ finger of left hand to prevent bending. Actively bend middle knuckle until stretch is felt. Hold __5__ seconds. Relax. Straighten finger as far as possible. Repeat __10__ times per set.  Do _4-6___ sessions per day.  PIP / DIP Composite Flexion (Passive Stretch)    Use other hand to bend middle and tip joints of __Lt long____ finger. Hold __10__ seconds. Repeat __5__ times. Do _4-6___ sessions per day.  PIP Extension (Passive    Use thumb of other hand on top of joint and two fingers under- neath on either side to straighten middle joint of __Lt long____ finger. Hold __10__ seconds. Repeat __5__ times. Do __4-6__ sessions per day.      Finger Flexion / Extension   With palm up, bend fingers of left hand toward palm, making a  fist. Straighten fingers, opening fist. Repeat sequence _10-15___ times per session. Do _4-6__ sessions per day.

## 2019-07-19 ENCOUNTER — Ambulatory Visit: Payer: 59 | Admitting: Occupational Therapy

## 2019-07-19 ENCOUNTER — Encounter: Payer: Self-pay | Admitting: Occupational Therapy

## 2019-07-19 ENCOUNTER — Other Ambulatory Visit: Payer: Self-pay

## 2019-07-19 DIAGNOSIS — M25642 Stiffness of left hand, not elsewhere classified: Secondary | ICD-10-CM | POA: Diagnosis not present

## 2019-07-19 DIAGNOSIS — M6281 Muscle weakness (generalized): Secondary | ICD-10-CM

## 2019-07-19 DIAGNOSIS — M25542 Pain in joints of left hand: Secondary | ICD-10-CM

## 2019-07-19 DIAGNOSIS — R600 Localized edema: Secondary | ICD-10-CM

## 2019-07-19 NOTE — Patient Instructions (Signed)
11. Grip Strengthening (Resistive Putty)   Squeeze putty using thumb and all fingers. Repeat 15 times. Do 1-2 sessions per day.   Extension (Assistive Putty)   Roll putty back and forth, being sure to use all fingertips. Repeat 3 times. Do 1-2 sessions per day.  Then pinch as below.   Palmar Pinch Strengthening (Resistive Putty)   Pinch putty between thumb and each fingertip in turn after rolling out             IP Fisting (Resistive Putty)   Keeping knuckles straight, bend fingertips to squeeze putty. Repeat 10 times. Do 1-2 sessions per day.    Marland Kitchen

## 2019-07-19 NOTE — Therapy (Signed)
[facility] [address] [place], [place], [address] Phone: [phone]   Fax:  [phone]  Occupational Therapy Treatment  Patient Details  Name: Julian Oneal MRN: [id number] Date of Birth: [birth year] Referring Provider (OT): Dr. Jeannie Fend   Encounter Date: 07/19/2019  OT End of Session - 07/19/19 1719    Visit Number  2    Number of Visits  7    Date for OT Re-Evaluation  08/18/19    Authorization Type  UHC    OT Start Time  1615    OT Stop Time  1700    OT Time Calculation (min)  45 min    Activity Tolerance  Patient tolerated treatment well    Behavior During Therapy  [facility] for tasks assessed/performed       Past Medical History:  Diagnosis Date  . Bell's palsy 2008   Dr Jannifer Franklin  . Dyspnea 08/28/2014   Followed in Pulmonary clinic/ [facility]/ [facility]  - 08/28/2014  Walked RA x 3 laps @ 185 ft each stopped due to end of study, fast pace, no doe or desats    - trial off acei 08/28/2014 >> improved 10/15/2014  - PFT's  10/21/2014  FEV1 3.40 (87 % ) ratio 88  p 6 % improvement from saba with DLCO  83 % corrects to 124 % for alv volume     . Excessive daytime sleepiness 04/04/2015  . Headache(784.0)   . Headaches, cluster 06/20/2013  . High cholesterol   . Hypertension   . Insomnia   . Obstructive sleep apnea 07/05/2012   Patient diagnosed with OSA and using CPAP at 6 cm water , with residual AHI of 0.8 on 07-04-12 . [facility] is his  DME .    Marland Kitchen Persistent headaches 07/05/2012    Referred to [facility] Dr. Domingo Cocking in 2013 -    . Pneumonia, viral 06/20/2013  . Taste absent     Past Surgical History:  Procedure Laterality Date  . CHOLECYSTECTOMY  2003  . FOOT SURGERY  2008  . HERNIA REPAIR  [id number]  . MOUTH SURGERY      There were no vitals filed for this visit.  Subjective Assessment - 07/19/19 1619    Subjective   Patient reports pain can be as high as 8                   OT  Treatments/Exercises (OP) - 07/19/19 0001      Exercises   Exercises  Hand;Theraputty      Hand Exercises   Other Hand Exercises  Reviewed HEP, Blocking exercises and hook fist.  Issued green putty and grip pinch exercises      Ultrasound   Ultrasound Location  left long finger    Ultrasound Parameters  54mhz, continuous, 0.8 w/cm2, 5 min    Ultrasound Goals  Edema;Pain      LUE Fluidotherapy   Number Minutes Fluidotherapy  10 Minutes   Pain relief prior to stretching   LUE Fluidotherapy Location  Hand      Manual Therapy   Manual Therapy  Edema management;Joint mobilization    Edema Management  Educated patient on edema massage.      Joint Mobilization  PIP Long left             OT Education - 07/19/19 1716    Education Details  Reviewed HEP, and initiated putty exercises (green)  grip, pinch, hook fist  Person(s) Educated  Patient    Methods  Explanation;Demonstration    Comprehension  Verbalized understanding;Returned demonstration          OT Long Term Goals - 07/19/19 1721      OT LONG TERM GOAL #1   Title  Independent with HEP Lt hand for ROM and strength    Status  On-going      OT LONG TERM GOAL #2   Title  Pt to verbalize understanding with pain management and edema management strategies    Status  On-going      OT LONG TERM GOAL #3   Title  Pt to improve Lt long finger PIP flexion by 5* or more    Status  On-going      OT LONG TERM GOAL #4   Title  Pt to improve grip strength Lt hand to 65 lbs or greater for work related tasks    Status  On-going      OT LONG TERM GOAL #5   Title  Pt to improve 3 tip pinch Lt hand to 13 lbs or greater for opening containers/ziplocs    Baseline  Lt = 9 lbs (Rt = 19 lbs)    Status  On-going            Plan - 07/19/19 1719    Clinical Impression Statement  Patient agreeable to OT goals, and eager for improved hand function.    OT Frequency  1x / week    OT Duration  6 weeks    OT  Treatment/Interventions  Self-care/ADL training;Therapeutic exercise;Splinting;Manual Therapy;Ultrasound;Therapeutic activities;Cryotherapy;Paraffin;DME and/or AE instruction;Compression bandaging;Fluidtherapy;Moist Heat;Contrast Bath;Passive range of motion;Patient/family education    Plan  fluido - consider wrapping into flexion, edema management, PROM, [place]?    Consulted and Agree with Plan of Care  Patient       Patient will benefit from skilled therapeutic intervention in order to improve the following deficits and impairments:           Visit Diagnosis: Stiffness of joint, hand, left  Pain in joint of left hand  Muscle weakness (generalized)  Localized edema    Problem List Patient Active Problem List   Diagnosis Date Noted  . Excessive daytime sleepiness 04/04/2015  . OSA on CPAP 04/04/2015  . Essential hypertension 08/29/2014  . Dyspnea 08/28/2014  . Pneumonia, viral 06/20/2013  . Headaches, cluster 06/20/2013  . Sleep apnea with use of continuous positive airway pressure (CPAP) 08/02/2012  . Insomnia, persistent 07/05/2012  . Obstructive sleep apnea 07/05/2012  . Persistent headaches 07/05/2012    Julian Oneal, OTR/L 07/19/2019, 5:24 PM  [facility] [address] [place], [place], [address] Phone: [phone]   Fax:  [phone]  Name: Julian Oneal MRN: [id number] Date of Birth: 05-Dec-1965

## 2019-07-25 ENCOUNTER — Ambulatory Visit: Payer: 59 | Admitting: Occupational Therapy

## 2019-07-25 ENCOUNTER — Encounter: Payer: Self-pay | Admitting: Occupational Therapy

## 2019-07-25 ENCOUNTER — Other Ambulatory Visit: Payer: Self-pay

## 2019-07-25 DIAGNOSIS — M25642 Stiffness of left hand, not elsewhere classified: Secondary | ICD-10-CM

## 2019-07-25 DIAGNOSIS — M6281 Muscle weakness (generalized): Secondary | ICD-10-CM

## 2019-07-25 DIAGNOSIS — M25542 Pain in joints of left hand: Secondary | ICD-10-CM

## 2019-07-25 DIAGNOSIS — R600 Localized edema: Secondary | ICD-10-CM

## 2019-07-25 NOTE — Therapy (Signed)
[facility] [address] [place], [place], [address] Phone: [phone]   Fax:  [phone]  Occupational Therapy Treatment  Patient Details  Name: Julian Oneal MRN: [id number] Date of Birth: 1965/06/05 Referring Provider (OT): Dr. Jeannie Fend   Encounter Date: 07/25/2019  OT End of Session - 07/25/19 1352    Visit Number  3    Number of Visits  7    Date for OT Re-Evaluation  08/18/19    Authorization Type  UHC    OT Start Time  [id number]    OT Stop Time  1400    OT Time Calculation (min)  43 min    Activity Tolerance  Patient tolerated treatment well    Behavior During Therapy  [facility] for tasks assessed/performed       Past Medical History:  Diagnosis Date  . Bell's palsy 2008   Dr Jannifer Franklin  . Dyspnea 08/28/2014   Followed in Pulmonary clinic/ [facility]/ [facility]  - 08/28/2014  Walked RA x 3 laps @ 185 ft each stopped due to end of study, fast pace, no doe or desats    - trial off acei 08/28/2014 >> improved 10/15/2014  - PFT's  10/21/2014  FEV1 3.40 (87 % ) ratio 88  p 6 % improvement from saba with DLCO  83 % corrects to 124 % for alv volume     . Excessive daytime sleepiness 04/04/2015  . Headache(784.0)   . Headaches, cluster 06/20/2013  . High cholesterol   . Hypertension   . Insomnia   . Obstructive sleep apnea 07/05/2012   Patient diagnosed with OSA and using CPAP at 6 cm water , with residual AHI of 0.8 on 07-04-12 . [facility] is his  DME .    Marland Kitchen Persistent headaches 07/05/2012    Referred to [facility] Dr. Domingo Cocking in 2013 -    . Pneumonia, viral 06/20/2013  . Taste absent     Past Surgical History:  Procedure Laterality Date  . CHOLECYSTECTOMY  2003  . FOOT SURGERY  2008  . HERNIA REPAIR  [id number]  . MOUTH SURGERY      There were no vitals filed for this visit.  Subjective Assessment - 07/25/19 1319    Subjective   It's been angry since I left here    Currently in Pain?  Yes    Pain Score   3     Pain Location  Finger (Comment which one)    Pain Orientation  Left    Pain Descriptors / Indicators  Throbbing    Pain Type  Chronic pain    Pain Onset  More than a month ago    Pain Frequency  Constant    Aggravating Factors   lateral pressure    Pain Relieving Factors  nothing                   OT Treatments/Exercises (OP) - 07/25/19 0001      Hand Exercises   Other Hand Exercises  Added coordiantion exercises to address ability to utilize LUE for manipulation tasks.  See patient instructions      LUE Fluidotherapy   Number Minutes Fluidotherapy  10 Minutes    LUE Fluidotherapy Location  Hand    Comments  pain relief      Splinting   Splinting  Provided PI extension splint.  Patient able to don/doff.  Starting wearing schedule - up to 2 hours at a time if he  can tolerate.  Patient felt immediate stretch to PIP extension 3rd digit.        Manual Therapy   Manual Therapy  Edema management;Joint mobilization    Edema Management  edema massage    Joint Mobilization  PIP Long left             OT Education - 07/25/19 1352    Education Details  PIP extension splint, wearing schedule, coordiantion exercises LUE    Person(s) Educated  Patient    Methods  Explanation;Demonstration;Handout    Comprehension  Verbalized understanding;Returned demonstration          OT Long Term Goals - 07/19/19 1721      OT LONG TERM GOAL #1   Title  Independent with HEP Lt hand for ROM and strength    Status  On-going      OT LONG TERM GOAL #2   Title  Pt to verbalize understanding with pain management and edema management strategies    Status  On-going      OT LONG TERM GOAL #3   Title  Pt to improve Lt long finger PIP flexion by 5* or more    Status  On-going      OT LONG TERM GOAL #4   Title  Pt to improve grip strength Lt hand to 65 lbs or greater for work related tasks    Status  On-going      OT LONG TERM GOAL #5   Title  Pt to improve 3 tip pinch Lt  hand to 13 lbs or greater for opening containers/ziplocs    Baseline  Lt = 9 lbs (Rt = 19 lbs)    Status  On-going            Plan - 07/25/19 1352    Clinical Impression Statement  Patient continues with pain in left 3rd digit, swelling less prominent, but still present.    OT Frequency  1x / week    OT Duration  6 weeks    OT Treatment/Interventions  Self-care/ADL training;Therapeutic exercise;Splinting;Manual Therapy;Ultrasound;Therapeutic activities;Cryotherapy;Paraffin;DME and/or AE instruction;Compression bandaging;Fluidtherapy;Moist Heat;Contrast Bath;Passive range of motion;Patient/family education    Plan  fluido for pain relief,  edema management, PROM, AAROM    OT Home Exercise Plan  putty, coordination    Consulted and Agree with Plan of Care  Patient       Patient will benefit from skilled therapeutic intervention in order to improve the following deficits and impairments:           Visit Diagnosis: Stiffness of joint, hand, left  Pain in joint of left hand  Muscle weakness (generalized)  Localized edema    Problem List Patient Active Problem List   Diagnosis Date Noted  . Excessive daytime sleepiness 04/04/2015  . OSA on CPAP 04/04/2015  . Essential hypertension 08/29/2014  . Dyspnea 08/28/2014  . Pneumonia, viral 06/20/2013  . Headaches, cluster 06/20/2013  . Sleep apnea with use of continuous positive airway pressure (CPAP) 08/02/2012  . Insomnia, persistent 07/05/2012  . Obstructive sleep apnea 07/05/2012  . Persistent headaches 07/05/2012    Mariah Milling, OTR/L 07/25/2019, 1:55 PM  [facility] [address] [place], [place], [address] Phone: [phone]   Fax:  [phone]  Name: Julian Oneal MRN: [id number] Date of Birth: [birth year]

## 2019-07-25 NOTE — Patient Instructions (Signed)
  Coordination Activities  Perform the following activities for 10 minutes 2 times per day with left hand(s).   Rotate ball in fingertips (clockwise and counter-clockwise).  Rotate card in hand (clockwise and counter-clockwise).  Shuffle cards.  Pick up coins and stack.  Pick up coins one at a time until you get 5-10 in your hand, then move coins from palm to fingertips to stack one at a time.  Twirl pen between fingers.

## 2019-08-01 ENCOUNTER — Ambulatory Visit
Admission: RE | Admit: 2019-08-01 | Discharge: 2019-08-01 | Disposition: A | Discharge status: Home / Self Care | Payer: 59 | Source: Ambulatory Visit | Attending: Family Medicine | Admitting: Family Medicine

## 2019-08-01 ENCOUNTER — Other Ambulatory Visit: Payer: Self-pay | Admitting: Family Medicine

## 2019-08-01 DIAGNOSIS — R079 Chest pain, unspecified: Secondary | ICD-10-CM

## 2019-08-01 DIAGNOSIS — R059 Cough, unspecified: Secondary | ICD-10-CM

## 2019-08-03 ENCOUNTER — Other Ambulatory Visit: Payer: Self-pay

## 2019-08-03 ENCOUNTER — Ambulatory Visit: Payer: 59 | Attending: Orthopaedic Surgery | Admitting: Occupational Therapy

## 2019-08-03 DIAGNOSIS — M25642 Stiffness of left hand, not elsewhere classified: Secondary | ICD-10-CM | POA: Diagnosis not present

## 2019-08-03 DIAGNOSIS — M6281 Muscle weakness (generalized): Secondary | ICD-10-CM | POA: Diagnosis present

## 2019-08-03 DIAGNOSIS — M25542 Pain in joints of left hand: Secondary | ICD-10-CM | POA: Diagnosis present

## 2019-08-03 DIAGNOSIS — R600 Localized edema: Secondary | ICD-10-CM | POA: Insufficient documentation

## 2019-08-03 NOTE — Therapy (Signed)
[facility] [address] [place], [place], [address] Phone: [phone]   Fax:  [phone]  Occupational Therapy Treatment  Patient Details  Name: KIYOSHI SCHAAB MRN: [id number] Date of Birth: 1966-01-23 Referring Provider (OT): Dr. Jeannie Fend   Encounter Date: 08/03/2019  OT End of Session - 08/03/19 1402    Visit Number  4    Number of Visits  7    Date for OT Re-Evaluation  08/18/19    Authorization Type  [employer]    OT Start Time  1155    OT Stop Time  1230    OT Time Calculation (min)  35 min    Activity Tolerance  Patient tolerated treatment well       Past Medical History:  Diagnosis Date  . Bell's palsy 2008   Dr Jannifer [place]  . Dyspnea 08/28/2014   Followed in Pulmonary clinic/ [facility]/ [facility]  - 08/28/2014  Walked RA x 3 laps @ 185 ft each stopped due to end of study, fast pace, no doe or desats    - trial off acei 08/28/2014 >> improved 10/15/2014  - PFT's  10/21/2014  FEV1 3.40 (87 % ) ratio 88  p 6 % improvement from saba with DLCO  83 % corrects to 124 % for alv volume     . Excessive daytime sleepiness 04/04/2015  . Headache(784.0)   . Headaches, cluster 06/20/2013  . High cholesterol   . Hypertension   . Insomnia   . Obstructive sleep apnea 07/05/2012   Patient diagnosed with OSA and using CPAP at 6 cm water , with residual AHI of 0.8 on 07-04-12 . [facility] is his  DME .    Marland Kitchen Persistent headaches 07/05/2012    Referred [facility] Dr. Domingo Cocking in 2013 -    . Pneumonia, viral 06/20/2013  . Taste absent     Past Surgical History:  Procedure Laterality Date  . CHOLECYSTECTOMY  2003  . FOOT SURGERY  2008  . HERNIA REPAIR  [id number]  . MOUTH SURGERY      There were no vitals filed for this visit.  Subjective Assessment - 08/03/19 1203    Subjective   It's doing better w/ less swelling and pain    Pertinent History  Lt long finger PIP sprain Jan 2021. Bell's palsy 2008 w/ subsequent  migraines    Limitations  none    Currently in Pain?  Yes    Pain Score  2     Pain Location  --   long finger   Pain Orientation  Left    Pain Descriptors / Indicators  Throbbing    Pain Type  Chronic pain    Pain Onset  More than a month ago    Pain Frequency  Constant    Aggravating Factors   lateral pressure    Pain Relieving Factors  nothing       Paraffin x 10 min Lt hand to decrease stiffness. Followed by edema massage and P/ROM to Lt long finger. Reviewed/assessed goals and ROM w/ PIP flexion now 103* Lt long finger.  Pt shown finger compression sleeve and where to purchase to manage edema/pain as needed. However, pain and swelling has subsided - pt has small pocket of fluid volarly over joint however collateral swelling has decreased.  Reviewed HEP and added pen rolling ex for IP flexion and composite flexion.  OT Long Term Goals - 08/03/19 1406      OT LONG TERM GOAL #1   Title  Independent with HEP Lt hand for ROM and strength    Status  Achieved      OT LONG TERM GOAL #2   Title  Pt to verbalize understanding with pain management and edema management strategies    Status  Achieved      OT LONG TERM GOAL #3   Title  Pt to improve Lt long finger PIP flexion by 5* or more    Status  Achieved   103*     OT LONG TERM GOAL #4   Title  Pt to improve grip strength Lt hand to 65 lbs or greater for work related tasks    Status  On-going      OT LONG TERM GOAL #5   Title  Pt to improve 3 tip pinch Lt hand to 13 lbs or greater for opening containers/ziplocs    Baseline  Lt = 9 lbs (Rt = 19 lbs)    Status  On-going            Plan - 08/03/19 1404    Clinical Impression Statement  Pt w/ less swelling and pain at long finger PIP joint and increased ROM. Pt has already met 3/5 LTG's at this time.    Occupational performance deficits (Please refer to evaluation for details):  IADL's;Work;Leisure    Body Structure /  Function / Physical Skills  ROM;ADL;Dexterity;IADL;Flexibility;Strength;FMC;UE functional use;Pain    Rehab Potential  Good    Comorbidities Affecting Occupational Performance:  None    OT Frequency  1x / week    OT Duration  6 weeks    OT Treatment/Interventions  Self-care/ADL training;Therapeutic exercise;Splinting;Manual Therapy;Ultrasound;Therapeutic activities;Cryotherapy;Paraffin;DME and/or AE instruction;Compression bandaging;Fluidtherapy;Moist Heat;Contrast Bath;Passive range of motion;Patient/family education    Plan  continue paraffin, work on grip and pinch strength, anticipate d/c within next 1-2 sessions    Consulted and Agree with Plan of Care  Patient       Patient will benefit from skilled therapeutic intervention in order to improve the following deficits and impairments:   Body Structure / Function / Physical Skills: ROM, ADL, Dexterity, IADL, Flexibility, Strength, FMC, UE functional use, Pain       Visit Diagnosis: Stiffness of joint, hand, left  Pain in joint of left hand  Muscle weakness (generalized)  Localized edema    Problem List Patient Active Problem List   Diagnosis Date Noted  . Excessive daytime sleepiness 04/04/2015  . OSA on CPAP 04/04/2015  . Essential hypertension 08/29/2014  . Dyspnea 08/28/2014  . Pneumonia, viral 06/20/2013  . Headaches, cluster 06/20/2013  . Sleep apnea with use of continuous positive airway pressure (CPAP) 08/02/2012  . Insomnia, persistent 07/05/2012  . Obstructive sleep apnea 07/05/2012  . Persistent headaches 07/05/2012    Carey Bullocks, OTR/L 08/03/2019, 2:07 PM  [facility] [address] [place], [place], [address] Phone: [phone]   Fax:  [phone]  Name: SAHIB PELLA MRN: [id number] Date of Birth: Apr 08, 1965

## 2019-08-09 ENCOUNTER — Ambulatory Visit: Payer: 59 | Admitting: Occupational Therapy

## 2019-08-30 ENCOUNTER — Other Ambulatory Visit: Payer: Self-pay

## 2019-08-30 ENCOUNTER — Ambulatory Visit: Payer: 59 | Admitting: Adult Health

## 2019-08-30 ENCOUNTER — Encounter: Payer: Self-pay | Admitting: Adult Health

## 2019-08-30 VITALS — BP 139/78 | HR 89 | Ht 70.0 in | Wt 154.0 lb

## 2019-08-30 DIAGNOSIS — G4700 Insomnia, unspecified: Secondary | ICD-10-CM | POA: Diagnosis not present

## 2019-08-30 DIAGNOSIS — G43709 Chronic migraine without aura, not intractable, without status migrainosus: Secondary | ICD-10-CM | POA: Diagnosis not present

## 2019-08-30 MED ORDER — NURTEC 75 MG PO TBDP: 75.0000 mg | ORAL_TABLET | Freq: Every day | ORAL | 0 refills | Status: DC | PRN

## 2019-08-30 NOTE — Progress Notes (Signed)
PATIENT: Julian Oneal DOB: April 15, 1965  REASON FOR VISIT: follow up HISTORY FROM: patient  HISTORY OF PRESENT ILLNESS: Today 08/30/19:  Julian Oneal is a 54 year old male with a history of migraine headaches and insomnia. He returns today for follow-up. He reports that he hit his head 3 to 4 weeks ago and saw Dr. Brigitte Pulse. He had to have sutures. States that he was placed on Lyrica 50 mg 3 times a day for nerve-like pain over the left eye. He reports that this has been beneficial. He has also been beneficial for his headaches. He states that he went from having daily headaches headache only 2-3 times a week. He was also given Nurtec samples and is also found that beneficial.  He continues to struggle with insomnia. Currently taking trazodone 150 mg at bedtime. In the past he has tried amitriptyline, doxepin, [place].    HISTORY 05/30/19:  Julian Oneal is a 54 year old male with a history of daily headaches and insomnia.  He returns today for follow-up.  He did repeat a home sleep study that did not show apnea.  He no longer uses a CPAP.  He reports that he has been under a lot of stress with his job and being the caregiver of 4 people (aunt uncle mom and dad).  He reports that trazodone continues to work fairly well for him.  He reports that he continues to have almost daily headaches.  He reports 2-3 times a week he may have a severe headache that would require him to lay down.  He states that he typically tries to take Tylenol and Advil.  He does have photophobia and phonophobia with his headaches.  He has tried multiple medications in the past including Topamax, gabapentin, Depakote, Lamictal, Imitrex, Ubrevly, Emgality, tizanidine and hydrocodone.   REVIEW OF SYSTEMS: Out of a complete 14 system review of symptoms, the patient complains only of the following symptoms, and all other reviewed systems are negative.  See HPI  ALLERGIES: Allergies  Allergen Reactions  . Amitriptyline      headaches  . Silenor [Doxepin Hcl]     headaches  . Tribenzor [Olmesartan-Amlodipine-Hctz]     headaches  . Topiramate     Severe headaches, memory loss    HOME MEDICATIONS: Outpatient Medications Prior to Visit  Medication Sig Dispense Refill  . cetirizine (ZYRTEC) 10 MG tablet Take 10 mg by mouth daily as needed.     Marland Kitchen CIALIS 5 MG tablet Take 5 mg by mouth daily.   11  . diltiazem (DILT-XR) 240 MG 24 hr capsule Take 1 capsule (240 mg total) by mouth daily. 90 capsule 3  . fenofibrate 160 MG tablet Take 160 mg by mouth daily.    . folic acid (FOLVITE) 956 MCG tablet Take 400 mcg by mouth daily.    . Fremanezumab-vfrm (AJOVY) 225 MG/1.5ML SOAJ Inject 225 mg into the skin every 30 (thirty) days. (Patient not taking: Reported on 07/04/2019) 1.5 mL 5  . hydrochlorothiazide (HYDRODIURIL) 25 MG tablet Take 25 mg by mouth daily.    Marland Kitchen LINZESS 72 MCG capsule Take 1 tablet by mouth daily.    Marland Kitchen MYDAYIS 37.5 MG CP24 Take 1 capsule by mouth daily.    Marland Kitchen omeprazole (PRILOSEC) 20 MG capsule Take 20 mg by mouth daily.    . rizatriptan (MAXALT-MLT) 10 MG disintegrating tablet Take 1 tablet (10 mg total) by mouth as needed for migraine. May repeat in 2 hours if needed, Max 2 tabs  in 24 hours (Patient not taking: Reported on 07/04/2019) 9 tablet 2  . traMADol (ULTRAM) 50 MG tablet Take 50 mg by mouth every 6 (six) hours as needed. for pain    . traZODone (DESYREL) 150 MG tablet TAKE 1 TABLET BY MOUTH EVERYDAY AT BEDTIME 30 tablet 11  . valsartan (DIOVAN) 160 MG tablet TAKE 1 TABLET BY MOUTH EVERY DAY 90 tablet 3   No facility-administered medications prior to visit.    PAST MEDICAL HISTORY: Past Medical History:  Diagnosis Date  . Bell's palsy 2008   Dr Jannifer [place]  . Dyspnea 08/28/2014   Followed in Pulmonary clinic/ [facility]/ [facility]  - 08/28/2014  Walked RA x 3 laps @ 185 ft each stopped due to end of study, fast pace, no doe or desats    - trial off acei 08/28/2014 >> improved 10/15/2014  - PFT's   10/21/2014  FEV1 3.40 (87 % ) ratio 88  p 6 % improvement from saba with DLCO  83 % corrects to 124 % for alv volume     . Excessive daytime sleepiness 04/04/2015  . Headache(784.0)   . Headaches, cluster 06/20/2013  . High cholesterol   . Hypertension   . Insomnia   . Obstructive sleep apnea 07/05/2012   Patient diagnosed with OSA and using CPAP at 6 cm water , with residual AHI of 0.8 on 07-04-12 . [facility] is his  DME .    Marland Kitchen Persistent headaches 07/05/2012    Referred to [facility]aches and wellness center Dr. Domingo Cocking in 2013 -    . Pneumonia, viral 06/20/2013  . Taste absent     PAST SURGICAL HISTORY: Past Surgical History:  Procedure Laterality Date  . CHOLECYSTECTOMY  2003  . FOOT SURGERY  2008  . HERNIA REPAIR  [id number]  . MOUTH SURGERY      FAMILY HISTORY: Family History  Problem Relation Age of Onset  . Cancer - Prostate Father   . Parkinson's disease Maternal Uncle   . Alzheimer's disease Paternal Aunt        in their late 69's  . Alzheimer's disease Paternal Uncle        in their late 67's  . Cancer - Prostate Maternal Grandfather   . Prostate cancer Maternal Grandfather   . Allergies Sister   . Heart disease Maternal Grandmother   . Heart disease Paternal Grandmother     SOCIAL HISTORY: Social History   Socioeconomic History  . Marital status: Married    Spouse name: Not on file  . Number of children: 0  . Years of education: College  . Highest education level: Not on file  Occupational History  . 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 Smoking status: Former Smoker    Packs/day: 0.25    Years: 5.00    Pack years: 1.25    Types: Cigarettes    Quit date: 03/02/1990    Years since quitting: 29.5  . Smokeless tobacco: Never Used  Vaping Use  . Vaping Use: Never used  Substance and Sexual Activity  . Alcohol use: Yes    Alcohol/week: 0.0 standard drinks    Comment:  occasionally  . Drug use: No  . Sexual activity: Not on file  Other Topics Concern  . 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 pt denies any illegal drugs, tobacco use(quit in 1992), consumes alcohol occasionally and consumes coffee.   Social Determinants of Health   Financial Resource Strain:   . Difficulty of Paying Living Expenses:   Food Insecurity:   . Worried About Charity fundraiser in the Last Year:   . Arboriculturist in the Last Year:   Transportation Needs:   . Film/video editor (Medical):   Marland Kitchen Lack of Transportation (Non-Medical):   Physical Activity:   . Days of Exercise per Week:   . Minutes of Exercise per Session:   Stress:   . Feeling of Stress :   Social Connections:   . Frequency of Communication with Friends and Family:   . Frequency of Social Gatherings with Friends and Family:   . Attends Religious Services:   . Active Member of Clubs or Organizations:   . Attends Archivist Meetings:   Marland Kitchen Marital Status:   Intimate Partner Violence:   . Fear of Current or Ex-Partner:   . Emotionally Abused:   Marland Kitchen Physically Abused:   . Sexually Abused:       PHYSICAL EXAM  Vitals:   08/30/19 1328  BP: 139/78  Pulse: 89  Weight: 154 lb (69.9 kg)  Height: '5\' 10"'  (1.778 m)   Body mass index is 22.1 kg/m.  Generalized: Well developed, in no acute distress   Neurological examination  Mentation: Alert oriented to time, place, history taking. Follows all commands speech and language fluent Cranial nerve II-XII: Pupils were equal round reactive to light. Extraocular movements were full, visual field were full on confrontational test.. Head turning and shoulder shrug  were normal and symmetric. Motor: The motor testing reveals 5 over 5 strength of all 4 extremities. Good symmetric motor tone is noted  throughout.  Sensory: Sensory testing is intact to soft touch on all 4 extremities. No evidence of extinction is noted.  Coordination: Cerebellar testing reveals good finger-nose-finger and heel-to-shin bilaterally.  Gait and station: Gait is normal. Tandem gait is normal. Romberg is negative. No drift is seen.  Reflexes: Deep tendon reflexes are symmetric and normal bilaterally.   DIAGNOSTIC DATA (LABS, IMAGING, TESTING) - I reviewed patient records, labs, notes, testing and imaging myself where available.  Lab Results  Component Value Date   HGB 15.1 02/08/2008      Component Value Date/Time   NA 138 02/06/2008 1045   K 3.9 02/06/2008 1045   CL 103 02/06/2008 1045   CO2 30 02/06/2008 1045   GLUCOSE 93 02/06/2008 1045   BUN 9 02/06/2008 1045   CREATININE 0.96 02/06/2008 1045   CALCIUM 9.6 02/06/2008 1045   GFRNONAA >60 02/06/2008 1045   GFRAA  02/06/2008 1045    >60        The eGFR has been calculated using the MDRD equation. This calculation has not been validated in all clinical     ASSESSMENT AND PLAN 54 y.o. year old male  has a past medical history of Bell's palsy (2008), Dyspnea (08/28/2014), Excessive daytime sleepiness (04/04/2015), Headache(784.0), Headaches, cluster (06/20/2013), High cholesterol, Hypertension, Insomnia, Obstructive sleep apnea (07/05/2012), Persistent headaches (07/05/2012), Pneumonia, viral (06/20/2013), and Taste absent. here with:  Migraine headaches   Continue Lyrica 50 mg 3 times a day  Nurtec samples given to the patient  Insomnia   Reduce dose of trazodone to 75 mg at bedtime  Start Belsomra 15 mg at bedtime  Discussed plan with Dr. Brett Fairy  who is in agreement  I spent 30 minutes of face-to-face and non-face-to-face time with patient.  This included previsit chart review, lab review, study review, order entry, electronic health record documentation, patient education.  Ward Givens, MSN, NP-C 08/30/2019, 1:29 PM [facility] [address] [place], [place] [address] [phone]

## 2019-08-30 NOTE — Patient Instructions (Signed)
Your Plan:  Continue lyrica Nurtec- if we have samples Will speak with Dr. Brett Fairy about sleep medication      Thank you for coming to see [place] at [facility]. I hope we have been able to provide you high quality care today.  You may receive a patient satisfaction survey over the next few weeks. We would appreciate your feedback and comments so that we may continue to improve ourselves and the health of our patients.

## 2019-08-31 MED ORDER — BELSOMRA 15 MG PO TABS: 15.0000 mg | ORAL_TABLET | Freq: Every day | ORAL | 5 refills | Status: DC

## 2020-02-07 DIAGNOSIS — M542 Cervicalgia: Secondary | ICD-10-CM | POA: Diagnosis not present

## 2020-02-14 DIAGNOSIS — M542 Cervicalgia: Secondary | ICD-10-CM | POA: Diagnosis not present

## 2020-02-20 DIAGNOSIS — M542 Cervicalgia: Secondary | ICD-10-CM | POA: Diagnosis not present

## 2020-02-27 ENCOUNTER — Ambulatory Visit: Payer: 59 | Admitting: Adult Health

## 2020-03-05 ENCOUNTER — Ambulatory Visit: Payer: 59 | Admitting: Adult Health

## 2020-03-05 DIAGNOSIS — M542 Cervicalgia: Secondary | ICD-10-CM | POA: Diagnosis not present

## 2020-03-12 DIAGNOSIS — M542 Cervicalgia: Secondary | ICD-10-CM | POA: Diagnosis not present

## 2020-03-28 DIAGNOSIS — R351 Nocturia: Secondary | ICD-10-CM | POA: Diagnosis not present

## 2020-03-28 DIAGNOSIS — M7020 Olecranon bursitis, unspecified elbow: Secondary | ICD-10-CM | POA: Diagnosis not present

## 2020-03-28 DIAGNOSIS — K5900 Constipation, unspecified: Secondary | ICD-10-CM | POA: Diagnosis not present

## 2020-03-28 DIAGNOSIS — I10 Essential (primary) hypertension: Secondary | ICD-10-CM | POA: Diagnosis not present

## 2020-04-02 ENCOUNTER — Encounter: Payer: Self-pay | Admitting: Occupational Therapy

## 2020-04-02 DIAGNOSIS — M542 Cervicalgia: Secondary | ICD-10-CM | POA: Diagnosis not present

## 2020-04-02 NOTE — Therapy (Unsigned)
[facility] [address] [place], [place], [address] Phone: [phone]   Fax:  [phone]  April 02, 2020    No Recipients  Occupational Therapy Discharge Summary   Patient: Julian Oneal MRN: [id number] Date of Birth: 11-09-1965  Diagnosis: No diagnosis found.  Referring Provider (OT): Dr. Marlena Clipper has not returned for therapy since 08/03/19.      Sincerely,  Ercole Georg, Algernon Huxley, OT  CC No Recipients  [facility] [address] [place], [place], [address] Phone: [phone]   Fax:  [phone]  Patient: [name] MRN: [id number] Date of Birth: [birth year]

## 2020-04-09 DIAGNOSIS — M542 Cervicalgia: Secondary | ICD-10-CM | POA: Diagnosis not present

## 2020-04-15 DIAGNOSIS — M542 Cervicalgia: Secondary | ICD-10-CM | POA: Diagnosis not present

## 2020-04-22 DIAGNOSIS — M542 Cervicalgia: Secondary | ICD-10-CM | POA: Diagnosis not present

## 2020-05-07 DIAGNOSIS — M25511 Pain in right shoulder: Secondary | ICD-10-CM | POA: Diagnosis not present

## 2020-05-07 DIAGNOSIS — M24021 Loose body in right elbow: Secondary | ICD-10-CM | POA: Diagnosis not present

## 2020-05-07 DIAGNOSIS — M542 Cervicalgia: Secondary | ICD-10-CM | POA: Diagnosis not present

## 2020-05-07 DIAGNOSIS — M25521 Pain in right elbow: Secondary | ICD-10-CM | POA: Diagnosis not present

## 2020-05-20 DIAGNOSIS — M25521 Pain in right elbow: Secondary | ICD-10-CM | POA: Insufficient documentation

## 2020-05-21 DIAGNOSIS — M25521 Pain in right elbow: Secondary | ICD-10-CM | POA: Diagnosis not present

## 2020-05-21 DIAGNOSIS — M25511 Pain in right shoulder: Secondary | ICD-10-CM | POA: Diagnosis not present

## 2020-05-22 DIAGNOSIS — M24021 Loose body in right elbow: Secondary | ICD-10-CM | POA: Diagnosis not present

## 2020-05-22 DIAGNOSIS — M542 Cervicalgia: Secondary | ICD-10-CM | POA: Diagnosis not present

## 2020-05-22 DIAGNOSIS — M25521 Pain in right elbow: Secondary | ICD-10-CM | POA: Diagnosis not present

## 2020-05-22 DIAGNOSIS — M25511 Pain in right shoulder: Secondary | ICD-10-CM | POA: Diagnosis not present

## 2020-06-07 ENCOUNTER — Other Ambulatory Visit: Payer: Self-pay | Admitting: Cardiology

## 2020-06-09 ENCOUNTER — Other Ambulatory Visit: Payer: Self-pay | Admitting: Adult Health

## 2020-06-12 DIAGNOSIS — I10 Essential (primary) hypertension: Secondary | ICD-10-CM | POA: Diagnosis not present

## 2020-06-12 DIAGNOSIS — R351 Nocturia: Secondary | ICD-10-CM | POA: Diagnosis not present

## 2020-06-12 DIAGNOSIS — K5900 Constipation, unspecified: Secondary | ICD-10-CM | POA: Diagnosis not present

## 2020-06-12 DIAGNOSIS — M7020 Olecranon bursitis, unspecified elbow: Secondary | ICD-10-CM | POA: Diagnosis not present

## 2020-06-15 ENCOUNTER — Other Ambulatory Visit: Payer: Self-pay | Admitting: Cardiology

## 2020-06-24 ENCOUNTER — Other Ambulatory Visit: Payer: Self-pay

## 2020-06-24 ENCOUNTER — Encounter: Payer: Self-pay | Admitting: Physician Assistant

## 2020-06-24 ENCOUNTER — Ambulatory Visit: Payer: BC Managed Care – PPO | Admitting: Physician Assistant

## 2020-06-24 VITALS — BP 120/70 | Ht 70.0 in | Wt 162.6 lb

## 2020-06-24 DIAGNOSIS — E782 Mixed hyperlipidemia: Secondary | ICD-10-CM

## 2020-06-24 DIAGNOSIS — I10 Essential (primary) hypertension: Secondary | ICD-10-CM | POA: Diagnosis not present

## 2020-06-24 DIAGNOSIS — Z8249 Family history of ischemic heart disease and other diseases of the circulatory system: Secondary | ICD-10-CM

## 2020-06-24 NOTE — Progress Notes (Signed)
Cardiology Office Note:    Date:  06/24/2020   ID:  Julian Oneal, DOB 05/05/1965, MRN [id number]  PCP:  Mayra Neer, [place]  Cardiologist:  Jenkins Rouge, MD   Electrophysiologist:  None       Referring MD: Mayra Neer, MD   Chief Complaint:  Follow-up (FHx of CAD, HTN)    Patient Profile:    Julian Oneal is a 55 y.o. male with:   Hypertension  Hyperlipidemia  Family history of CAD  Coronary CTA 7/17: Calcium score 0; no CAD  CT cardiac scoring 10/2018: Calcium score 0  Echocardiogram 10/2018: EF 60-65  OSA on CPAP  Chronic headaches  Prior CV studies: Echocardiogram 10/25/2018 EF 60-65, normal RVSF  CT Cardiac Scoring 10/03/2018 IMPRESSION: Coronary calcium score of 0.  Coronary CTA 09/19/15 FINDINGS: Non-cardiac: See separate report from [facility]. No significant findings on limited lung and soft tissue windows. Calcium Score:  0 Coronary Arteries: Right dominant with no anomalies LM: Normal LAD:  Normal D1: Normal Circumflex: Normal OM1: Normal OM2: Normal RCA:  Dominant and normal PDA: Normal PLA:  Normal IMPRESSION: 1) Calcium Score 0 2) Normal right dominant coronary arteries  Carotid US 01/13/2013 No hemodynamically significant ICA  stenosis bilaterally  History of Present Illness: Julian Oneal was last seen in clinic by Kathyrn Drown, NP in 06/2019.  He returns for annual f/u.  He is here alone.  He is overall doing well without chest pain, syncope, orthopnea, leg edema.  He has chronic dyspnea on exertion with certain activities.  This has been chronic and unchanged since he had pneumonia years ago.  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 His stress level is better. He ran out of diltiazem 2 weeks ago and has not had any since.  His BPs at home have been optimal.       Past Medical History:  Diagnosis Date  . Bell's palsy 2008   Dr Jannifer [place]  . Dyspnea  08/28/2014   Followed in Pulmonary clinic/ [facility]/ [facility]  - 08/28/2014  Walked RA x 3 laps @ 185 ft each stopped due to end of study, fast pace, no doe or desats    - trial off acei 08/28/2014 >> improved 10/15/2014  - PFT's  10/21/2014  FEV1 3.40 (87 % ) ratio 88  p 6 % improvement from saba with DLCO  83 % corrects to 124 % for alv volume     . Excessive daytime sleepiness 04/04/2015  . Headache(784.0)   . Headaches, cluster 06/20/2013  . High cholesterol   . Hypertension   . Insomnia   . Obstructive sleep apnea 07/05/2012   Patient diagnosed with OSA and using CPAP at 6 cm water , with residual AHI of 0.8 on 07-04-12 . [facility] is his  DME .    Marland Kitchen Persistent headaches 07/05/2012    Referred to Headaches and wellness center Dr. Domingo Cocking in 2013 -    . Pneumonia, viral 06/20/2013  . Taste absent     Current Medications: Current Meds  Medication Sig  . cetirizine (ZYRTEC) 10 MG tablet Take 10 mg by mouth daily as needed.   Marland Kitchen CIALIS 5 MG tablet Take 5 mg by mouth daily.   . fenofibrate 160 MG tablet Take 160 mg by mouth daily.  Marland Kitchen omeprazole (PRILOSEC) 20 MG capsule Take 20 mg by mouth daily.  Marland Kitchen Plecanatide (TRULANCE) 3 MG TABS  Take 1 tablet by mouth daily.  . sodium chloride 0.9 % nebulizer solution SMARTSIG:5 Milliliter(s) Via Inhaler As Directed  . traZODone (DESYREL) 150 MG tablet TAKE 1 TABLET BY MOUTH EVERYDAY AT BEDTIME  . valsartan (DIOVAN) 160 MG tablet TAKE 1 TABLET BY MOUTH EVERY DAY  . venlafaxine XR (EFFEXOR-XR) 75 MG 24 hr capsule Take 75 mg by mouth daily with breakfast.     Allergies:   Amitriptyline, Silenor [doxepin hcl], Tribenzor [olmesartan-amlodipine-hctz], and Topiramate   Social History   Tobacco Use  . Smoking status: Former Smoker    Packs/day: 0.25    Years: 5.00    Pack years: 1.25    Types: Cigarettes    Quit date: 03/02/1990    Years since quitting: 30.3  . Smokeless tobacco: Never Used  Vaping Use  . Vaping Use: Never used  Substance Use Topics  .  Alcohol use: Yes    Alcohol/week: 0.0 standard drinks    Comment: occasionally  . Drug use: No     Family Hx: The patient's family history includes Allergies in his sister; Alzheimer's disease in his paternal aunt and paternal uncle; Cancer - Prostate in his father and maternal grandfather; Heart disease in his maternal grandmother and paternal grandmother; Parkinson's disease in his maternal uncle; Prostate cancer in his maternal grandfather.  ROS   EKGs/Labs/Other Test Reviewed:    EKG:  EKG is   ordered today.  The ekg ordered today demonstrates NSR, HR 94, normal axis, no ST-T wave changes, QTC 425  Recent Labs: No results found for requested labs within last 8760 hours.   Recent Lipid Panel No results found for: CHOL, TRIG, HDL, CHOLHDL, LDLCALC, LDLDIRECT  Labs obtained through [facility] - personally reviewed and interpreted: 01/18/2020: TC 146, HDL 44, LDL 82, triglycerides 107, creatinine 0.91, potassium 4, ALT 15  Risk Assessment/Calculations:      Physical Exam:    VS:  BP 120/70   Ht 5\' 10"  (1.778 m)   Wt 162 lb 9.6 oz (73.8 kg)   SpO2 97%   BMI 23.33 kg/m     Wt Readings from Last 3 Encounters:  06/24/20 162 lb 9.6 oz (73.8 kg)  08/30/19 154 lb (69.9 kg)  06/22/19 156 lb (70.8 kg)     Constitutional:      Appearance: Healthy appearance. Not in distress.  Neck:     Vascular: No carotid bruit. JVD normal.  Pulmonary:     Effort: Pulmonary effort is normal.     Breath sounds: No wheezing. No rales.  Cardiovascular:     Normal rate. Regular rhythm. Normal S1. Normal S2.     Murmurs: There is no murmur.  Edema:    Peripheral edema absent.  Abdominal:     Palpations: Abdomen is soft. There is no hepatomegaly.  Skin:    General: Skin is warm and dry.  Neurological:     General: No focal deficit present.     Mental Status: Alert and oriented to person, place and time.     Cranial Nerves: Cranial nerves are intact.          ASSESSMENT & PLAN:     1. Essential hypertension The patient's blood pressure is controlled on his current regimen.  He has not had diltiazem for the last couple of weeks.  I have asked him to continue to monitor his blood pressures and let me know if his BP averages >130/80.  As long as his blood pressure remains optimal, I do  not think he will need to resume diltiazem.  2. Mixed hyperlipidemia Most recent lipid panel looks optimal.  This is managed by primary care.  He currently takes fenofibrate.  3. Family history of early CAD CT in 2020 demonstrated calcium score of 0.  He is doing well without anginal symptoms.  Electrocardiogram is normal.  Consider repeat calcium score in 2 to 3 years    Dispo:  Return in about 1 year (around 06/24/2021) for Routine Follow Up w/ Dr. Johnsie Cancel.   Medication Adjustments/Labs and Tests Ordered: Current medicines are reviewed at length with the patient today.  Concerns regarding medicines are outlined above.  Tests Ordered: Orders Placed This Encounter  Procedures  . EKG 12-Lead   Medication Changes: No orders of the defined types were placed in this encounter.   Signed, Richardson Dopp, PA-C  06/24/2020 3:39 PM    [facility] [place], [place], [place]  [address] Phone: [phone]; Fax: [phone]

## 2020-06-24 NOTE — Patient Instructions (Signed)
Medication Instructions:  Your physician recommends that you continue on your current medications as directed. Please refer to the Current Medication list given to you today.  *If you need a refill on your cardiac medications before your next appointment, please call your pharmacy*   Lab Work: None   If you have labs (blood work) drawn today and your tests are completely normal, you will receive your results only by: . MyChart Message (if you have MyChart) OR . A paper copy in the mail If you have any lab test that is abnormal or we need to change your treatment, we will call you to review the results.   Testing/Procedures: None   Follow-Up: At [facility], you and your health needs are our priority.  As part of our continuing mission to provide you with exceptional heart care, we have created designated Provider Care Teams.  These Care Teams include your primary Cardiologist (physician) and Advanced Practice Providers (APPs -  Physician Assistants and Nurse Practitioners) who all work together to provide you with the care you need, when you need it.  We recommend signing up for the patient portal called "MyChart".  Sign up information is provided on this After Visit Summary.  MyChart is used to connect with patients for Virtual Visits (Telemedicine).  Patients are able to view lab/test results, encounter notes, upcoming appointments, etc.  Non-urgent messages can be sent to your provider as well.   To learn more about what you can do with MyChart, go to [email or web address].    Your next appointment:   1 year(s)  The format for your next appointment:   In Person  Provider:   Peter Nishan, MD   Other Instructions Your physician wants you to follow-up in: 1 year with  Dr. Nishan.  You will receive a reminder letter in the mail two months in advance. If you don't receive a letter, please call our office to schedule the follow-up appointment.   

## 2020-06-27 ENCOUNTER — Other Ambulatory Visit: Payer: Self-pay | Admitting: Adult Health

## 2020-09-06 ENCOUNTER — Ambulatory Visit
Admission: RE | Admit: 2020-09-06 | Discharge: 2020-09-06 | Disposition: A | Discharge status: Home / Self Care | Payer: BC Managed Care – PPO | Source: Ambulatory Visit | Attending: Advanced Practice Midwife | Admitting: Advanced Practice Midwife

## 2020-09-06 ENCOUNTER — Other Ambulatory Visit: Payer: Self-pay | Admitting: Advanced Practice Midwife

## 2020-09-06 DIAGNOSIS — R059 Cough, unspecified: Secondary | ICD-10-CM

## 2020-09-30 DIAGNOSIS — Z0000 Encounter for general adult medical examination without abnormal findings: Secondary | ICD-10-CM | POA: Diagnosis not present

## 2020-09-30 DIAGNOSIS — I10 Essential (primary) hypertension: Secondary | ICD-10-CM | POA: Diagnosis not present

## 2020-09-30 DIAGNOSIS — Z125 Encounter for screening for malignant neoplasm of prostate: Secondary | ICD-10-CM | POA: Diagnosis not present

## 2020-09-30 DIAGNOSIS — E782 Mixed hyperlipidemia: Secondary | ICD-10-CM | POA: Diagnosis not present

## 2020-10-03 ENCOUNTER — Other Ambulatory Visit: Payer: Self-pay | Admitting: Adult Health

## 2020-10-11 DIAGNOSIS — M545 Low back pain, unspecified: Secondary | ICD-10-CM | POA: Diagnosis not present

## 2020-12-02 DIAGNOSIS — B349 Viral infection, unspecified: Secondary | ICD-10-CM | POA: Diagnosis not present

## 2020-12-02 DIAGNOSIS — J029 Acute pharyngitis, unspecified: Secondary | ICD-10-CM | POA: Diagnosis not present

## 2020-12-02 DIAGNOSIS — R059 Cough, unspecified: Secondary | ICD-10-CM | POA: Diagnosis not present

## 2020-12-02 DIAGNOSIS — U071 COVID-19: Secondary | ICD-10-CM | POA: Diagnosis not present

## 2021-03-28 IMAGING — CR DG FINGER MIDDLE 2+V*L*
3 series · 3 of 3 positions shown · non-contrast
Comparison: None.

CLINICAL DATA: Blunt trauma to the third digit, initial encounter

EXAM:
LEFT MIDDLE FINGER 2+V

[x finger pa left]
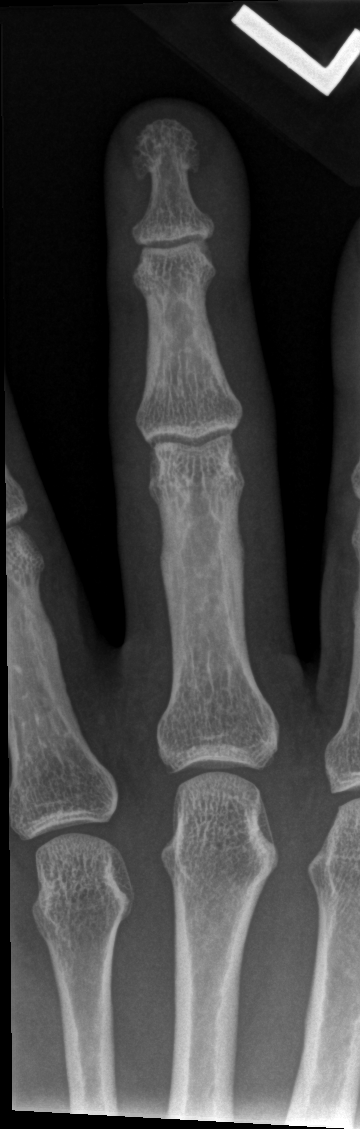

[x finger obl. left]
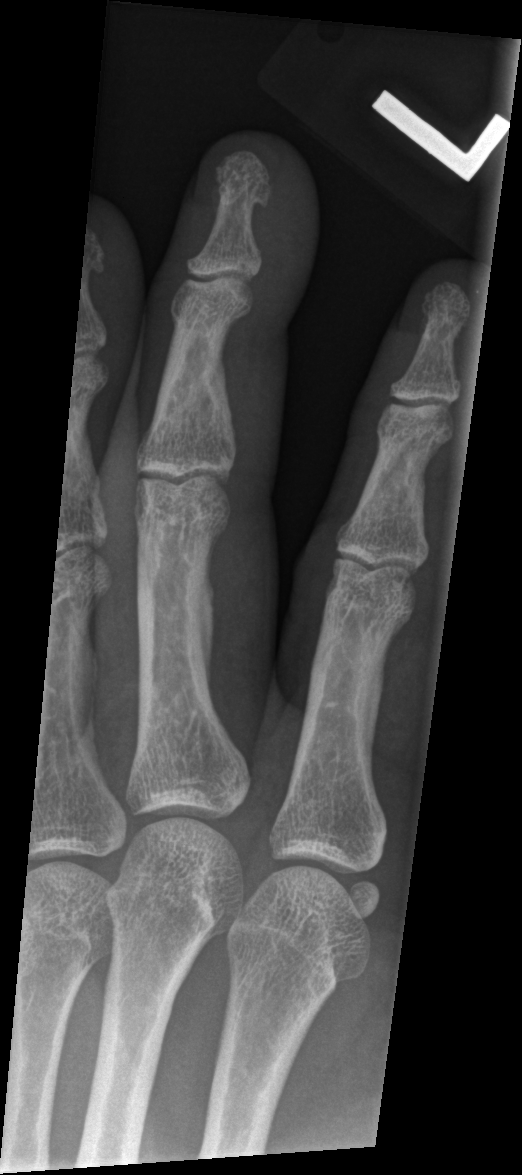

[x finger lateral left]
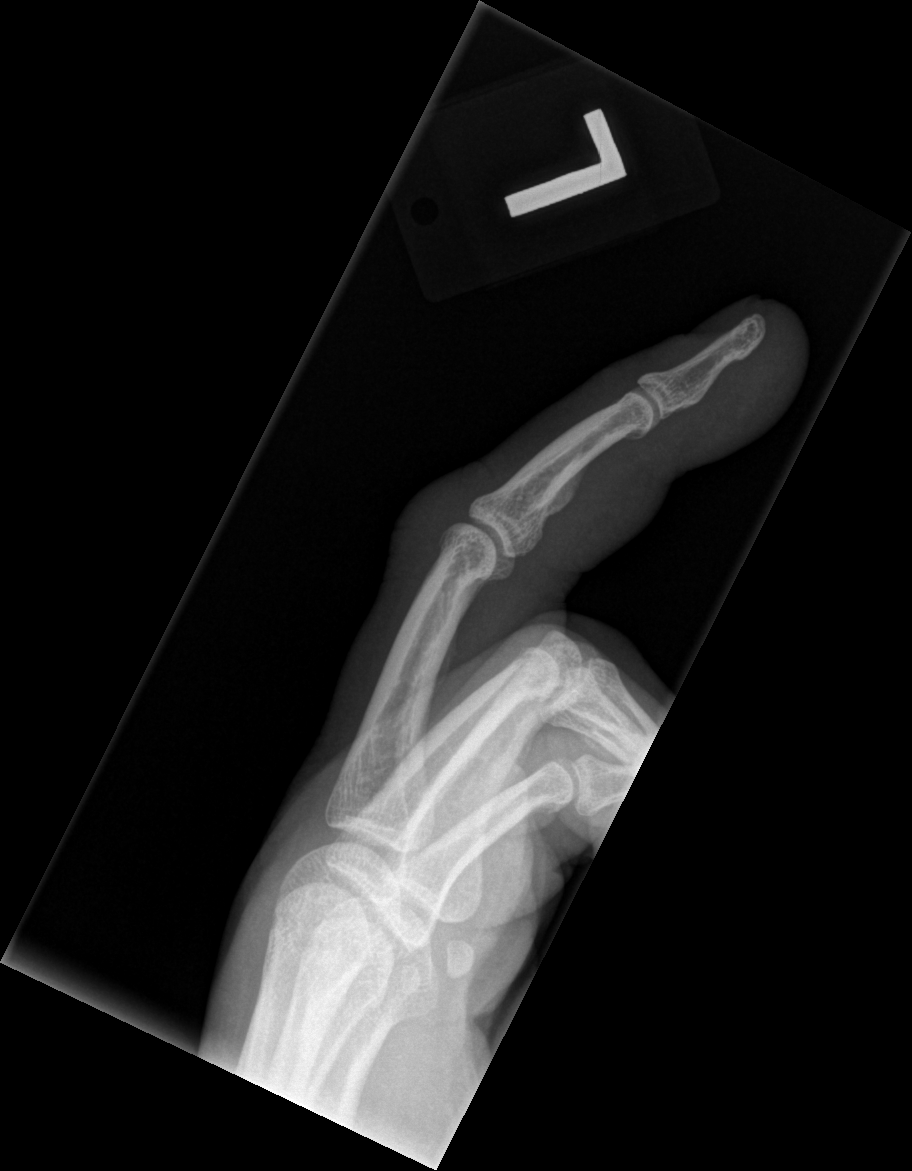

[3 of 3 positions shown; findings below may reference images not displayed]

FINDINGS: Mild soft tissue swelling is noted. No acute fracture or dislocation
is noted. No soft tissue abnormality is seen.
IMPRESSION: Mild soft tissue swelling without acute bony abnormality.

## 2021-04-03 DIAGNOSIS — I10 Essential (primary) hypertension: Secondary | ICD-10-CM | POA: Diagnosis not present

## 2021-04-03 DIAGNOSIS — E782 Mixed hyperlipidemia: Secondary | ICD-10-CM | POA: Diagnosis not present

## 2021-04-03 DIAGNOSIS — G4700 Insomnia, unspecified: Secondary | ICD-10-CM | POA: Diagnosis not present

## 2021-04-03 DIAGNOSIS — G43909 Migraine, unspecified, not intractable, without status migrainosus: Secondary | ICD-10-CM | POA: Diagnosis not present

## 2021-10-09 DIAGNOSIS — F411 Generalized anxiety disorder: Secondary | ICD-10-CM | POA: Diagnosis not present

## 2021-10-09 DIAGNOSIS — Z0000 Encounter for general adult medical examination without abnormal findings: Secondary | ICD-10-CM | POA: Diagnosis not present

## 2021-10-09 DIAGNOSIS — E782 Mixed hyperlipidemia: Secondary | ICD-10-CM | POA: Diagnosis not present

## 2021-10-09 DIAGNOSIS — Z125 Encounter for screening for malignant neoplasm of prostate: Secondary | ICD-10-CM | POA: Diagnosis not present

## 2021-10-09 DIAGNOSIS — I10 Essential (primary) hypertension: Secondary | ICD-10-CM | POA: Diagnosis not present

## 2021-10-20 NOTE — Therapy (Signed)
OUTPATIENT PHYSICAL THERAPY EVALUATION   Patient Name: Julian Oneal MRN: [id number] DOB:1965/12/15, 56 y.o., male Today's Date: 10/23/2021   PT End of Session - 10/23/21 0840     Visit Number 1    Number of Visits 20    Date for PT Re-Evaluation 01/01/22    Authorization Type [employer] $50 copay , 30 visits    Authorization - Visit Number 1    Authorization - Number of Visits 30    PT Start Time 0844    PT Stop Time 0923    PT Time Calculation (min) 39 min    Activity Tolerance Patient limited by pain    Behavior During Therapy [facility] for tasks assessed/performed             Past Medical History:  Diagnosis Date   Bell's palsy 2008   Dr Jannifer Franklin   Dyspnea 08/28/2014   Followed in Pulmonary clinic/  [facility]  - 08/28/2014  Walked RA x 3 laps @ 185 ft each stopped due to end of study, fast pace, no doe or desats    - trial off acei 08/28/2014 >> improved 10/15/2014  - PFT's  10/21/2014  FEV1 3.40 (87 % ) ratio 88  p 6 % improvement from saba with DLCO  83 % corrects to 124 % for alv volume      Excessive daytime sleepiness 04/04/2015   Headache(784.0)    Headaches, cluster 06/20/2013   High cholesterol    Hypertension    Insomnia    Obstructive sleep apnea 07/05/2012   Patient diagnosed with OSA and using CPAP at 6 cm water , with residual AHI of 0.8 on 07-04-12 . [facility] is his  DME .     Persistent headaches 07/05/2012    [facility] Dr. Domingo Cocking in 2013 -     Pneumonia, viral 06/20/2013   Taste absent    Past Surgical History:  Procedure Laterality Date   CHOLECYSTECTOMY  2003   FOOT SURGERY  2008   HERNIA REPAIR  [id number]   MOUTH SURGERY     Patient Active Problem List   Diagnosis Date Noted   Excessive daytime sleepiness 04/04/2015   OSA on CPAP 04/04/2015   Essential hypertension 08/29/2014   Dyspnea 08/28/2014   Pneumonia, viral 06/20/2013   Headaches, cluster 06/20/2013   Sleep apnea with use of continuous positive airway  pressure (CPAP) 08/02/2012   Insomnia, persistent 07/05/2012   Obstructive sleep apnea 07/05/2012   Persistent headaches 07/05/2012    PCP: Mayra Neer MD  REFERRING PROVIDER: Mayra Neer MD  REFERRING DIAG: M54.50 - Low back pain  Rationale for Evaluation and Treatment Rehabilitation  THERAPY DIAG:  Other low back pain  Abnormal posture  Difficulty in walking, not elsewhere classified  ONSET DATE: Fall 2022  SUBJECTIVE:  SUBJECTIVE STATEMENT: Pt indicated he fell off a ladder and fell on back on top of ladder.  Pt indicated back has been getting worse.  Pt indicated pain medicine for symptoms primarily with help.  Pt indicated sometimes walking/standing can be trouble.  Has a back brace that helps some.    Pt indicated a fall on gravel last night that seemed to hurt some more in back.  Pt indicated back pain does impact sleeping.  Denied numbness/tingling in legs.  No retention of bladder control.    PERTINENT HISTORY:  High cholesterol, HTN  PAIN:  NPRS scale: at current 7/10, at worst 10/10, at best 2/10 Pain location: low back (center to Rt) Pain description: constant, dull/achy  Aggravating factors: walking, standing Relieving factors: pain medicine   PRECAUTIONS: None  WEIGHT BEARING RESTRICTIONS No  FALLS:  Has patient fallen in last 6 months? 2 Any falling fear?  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  Small farm with cattle  PATIENT GOALS  : get rid of pain   OBJECTIVE:   PATIENT SURVEYS:  10/23/2021 FOTO intake:  44  predicted:  59  SCREENING FOR RED FLAGS: 10/23/2021 Bowel  or bladder incontinence: No Cauda equina syndrome: No  COGNITION: 10/23/2021  Overall cognitive status: WFL normal    SENSATION: 10/23/2021 [facility]  MUSCLE LENGTH: 10/23/2021 None tested today  POSTURE:  10/23/2021 reduced lumbar lordosis in standing c mild forward trunk posture.  No lateral shift noted in standing. Equal iliac crest heights  PALPATION: 10/23/2021 Tenderness and trigger points noted in bilateral lumbar paraspinals, QL.    LUMBAR ROM:   Active  AROM  10/23/2021  Flexion Movement to ankle c ERP, painful arc upon return  Extension 50% c ERP , repeated x5 in standing no change ERP continued  Right lateral flexion   Left lateral flexion   Right rotation   Left rotation    (Blank rows = not tested)  LOWER EXTREMITY ROM:      Right 10/23/2021 Left 10/23/2021  Hip flexion    Hip extension    Hip abduction    Hip adduction    Hip internal rotation    Hip external rotation    Knee flexion    Knee extension    Ankle dorsiflexion    Ankle plantarflexion    Ankle inversion    Ankle eversion     (Blank rows = not tested)  LOWER EXTREMITY MMT:    MMT Right 10/23/2021 Left 10/23/2021  Hip flexion 5/5 5/5  Hip extension    Hip abduction    Hip adduction    Hip internal rotation    Hip external rotation    Knee flexion 5/5 5/5  Knee extension 5/5 5/5  Ankle dorsiflexion 5/5 5/5  Ankle plantarflexion    Ankle inversion    Ankle eversion     (Blank rows = not tested)  LUMBAR SPECIAL TESTS:  10/23/2021 (-) slump bilateral  PAIVM testing:  cPAs with pain and hypomobility L1-L5, increased pain response L3-L5 FUNCTIONAL TESTS:  10/23/2021 18 inch transfer: no UE c pain noted Lt SLS : 5 seconds, Rt SLS 8 seconds  GAIT: 10/23/2021 Independent ambulation     TODAY'S TREATMENT  10/23/2021 Therex:    HEP instruction/performance c cues for techniques, handout provided.  Trial set performed of each for comprehension  and symptom assessment.  See below for  exercise list  Manual: G2 cPA L1-L3, skilled palpation during DN  Dry needling: Concordant symptoms noted from bilateral L4, L5 lumbar paraspinals and multifidi.  No adverse reactions noted post treatment.  Education given per handout.    Estim IFC to tolerance 10 mins lower back in prone after manual/DN.    PATIENT EDUCATION:  10/23/2021 Education details: HEP, POC, DN Person educated: Patient Education method: Explanation, Demonstration, Verbal cues, and Handouts Education comprehension: verbalized understanding, returned demonstration, and verbal cues required    HOME EXERCISE PROGRAM: Access Code: [email or web address] URL: [email or web address] Date: 10/23/2021 Prepared by: Scot Jun  Exercises - Supine Lower Trunk Rotation  - 2-3 x daily - 7 x weekly - 1 sets - 3-5 reps - 15 hold - Supine Single Knee to Chest Stretch  - 2-3 x daily - 7 x weekly - 1 sets - 3-5 reps - 15 hold - Seated Multifidi Isometric  - 2-3 x daily - 7 x weekly - 1 sets - 10 reps - 5-10 hold - Shoulder External Rotation and Scapular Retraction  - 2-3 x daily - 7 x weekly - 1 sets - 10 reps - 2 hold  ASSESSMENT:  CLINICAL IMPRESSION: Patient is a 56 y.o. who comes to clinic with complaints of low back pain with mobility deficits primary that impair their ability to perform usual daily and recreational functional activities without increase difficulty/symptoms at this time.  Patient to benefit from skilled PT services to address impairments and limitations to improve to previous level of function without restriction secondary to condition.    OBJECTIVE IMPAIRMENTS decreased activity tolerance, decreased balance, decreased endurance, decreased mobility, difficulty walking, decreased ROM, hypomobility, increased fascial restrictions, impaired perceived functional ability, increased muscle spasms, impaired flexibility, improper body mechanics, postural dysfunction, and pain.   ACTIVITY LIMITATIONS  carrying, lifting, bending, sitting, standing, squatting, sleeping, stairs, transfers, bed mobility, bathing, locomotion level, and caring for others  PARTICIPATION LIMITATIONS: meal prep, cleaning, laundry, interpersonal relationship, community activity, and yard work  PERSONAL FACTORS Time since onset of injury/illness/exacerbation , High cholesterol, HTNare also affecting patient's functional outcome.   REHAB POTENTIAL: Good  CLINICAL DECISION MAKING: Stable/uncomplicated  EVALUATION COMPLEXITY: Low   GOALS: Goals reviewed with patient? Yes  Short term PT Goals (target date for Short term goals are 3 weeks 11/13/2021) Patient will demonstrate independent use of home exercise program to maintain progress from in clinic treatments. Goal status: New   Long term PT goals (target dates for all long term goals are 10 weeks  01/01/2022 )   1. Patient will demonstrate/report pain at worst less than or equal to 2/10 to facilitate minimal limitation in daily activity secondary to pain symptoms. Goal status: New   2. Patient will demonstrate independent use of home exercise program to facilitate ability to maintain/progress functional gains from skilled physical therapy services. Goal status: New   3. Patient will demonstrate FOTO outcome > or = 59 % to indicate reduced disability due to condition. Goal status: New   4. Patient will demonstrate lumbar extension 100 % WFL s symptoms to facilitate upright standing, walking posture at PLOF s limitation. Goal status: New   5.  Patient will demonstrate/report ability to ascend/descend stairs at home with reciprocal gait s limitation due to back symptoms.    Goal status: New   6.  Patient will demonstrate/report ability to perform caregiver activity for family s restriction due to back symptoms.   Goal status: New  PLAN: PT FREQUENCY: 1-2x/week  PT DURATION: 10 weeks  PLANNED INTERVENTIONS: Therapeutic exercises, Therapeutic activity,  Neuro Muscular re-education, Balance training, Gait training, Patient/Family education, Joint mobilization, Stair training, DME instructions, Dry Needling, Electrical stimulation, Cryotherapy, Moist heat, Taping, Ultrasound, Ionotophoresis '4mg'$ /ml Dexamethasone, and Manual therapy.  All included unless contraindicated   PLAN FOR NEXT SESSION: Review HEP knowledge/results.   Dry needling as desired (paraspinals, multifidi, QL) , progressive mobility improvements.   Scot Jun, PT, DPT, OCS, ATC 10/23/21  9:27 AM

## 2021-10-23 ENCOUNTER — Ambulatory Visit: Payer: BC Managed Care – PPO | Admitting: Rehabilitative and Restorative Service Providers"

## 2021-10-23 ENCOUNTER — Other Ambulatory Visit: Payer: Self-pay

## 2021-10-23 ENCOUNTER — Encounter: Payer: Self-pay | Admitting: Rehabilitative and Restorative Service Providers"

## 2021-10-23 DIAGNOSIS — M5459 Other low back pain: Secondary | ICD-10-CM | POA: Diagnosis not present

## 2021-10-23 DIAGNOSIS — R293 Abnormal posture: Secondary | ICD-10-CM | POA: Diagnosis not present

## 2021-10-23 DIAGNOSIS — R262 Difficulty in walking, not elsewhere classified: Secondary | ICD-10-CM | POA: Diagnosis not present

## 2021-10-29 ENCOUNTER — Encounter: Payer: Self-pay | Admitting: Rehabilitative and Restorative Service Providers"

## 2021-10-29 ENCOUNTER — Ambulatory Visit: Payer: BC Managed Care – PPO | Admitting: Rehabilitative and Restorative Service Providers"

## 2021-10-29 DIAGNOSIS — M5459 Other low back pain: Secondary | ICD-10-CM

## 2021-10-29 DIAGNOSIS — R293 Abnormal posture: Secondary | ICD-10-CM

## 2021-10-29 DIAGNOSIS — R262 Difficulty in walking, not elsewhere classified: Secondary | ICD-10-CM | POA: Diagnosis not present

## 2021-10-29 NOTE — Therapy (Signed)
OUTPATIENT PHYSICAL THERAPY TREATMENT   Patient Name: Julian Oneal MRN: [id number] DOB:27-Jul-1965, 56 y.o., male Today's Date: 10/29/2021  END OF SESSION:   PT End of Session - 10/29/21 0820     Visit Number 2    Number of Visits 20    Date for PT Re-Evaluation 01/01/22    Authorization Type [employer] $50 copay , 30 visits    Authorization - Visit Number 2    Authorization - Number of Visits 30    PT Start Time 0800    PT Stop Time 0840    PT Time Calculation (min) 40 min    Activity Tolerance Patient limited by pain    Behavior During Therapy [facility] for tasks assessed/performed              Past Medical History:  Diagnosis Date   Bell's palsy 2008   Dr Jannifer Franklin   Dyspnea 08/28/2014   Followed in Pulmonary clinic/ [facility]  - 08/28/2014  Walked RA x 3 laps @ 185 ft each stopped due to end of study, fast pace, no doe or desats    - trial off acei 08/28/2014 >> improved 10/15/2014  - PFT's  10/21/2014  FEV1 3.40 (87 % ) ratio 88  p 6 % improvement from saba with DLCO  83 % corrects to 124 % for alv volume      Excessive daytime sleepiness 04/04/2015   Headache(784.0)    Headaches, cluster 06/20/2013   High cholesterol    Hypertension    Insomnia    Obstructive sleep apnea 07/05/2012   Patient diagnosed with OSA and using CPAP at 6 cm water , with residual AHI of 0.8 on 07-04-12 . [facility] is his  DME .     Persistent headaches 07/05/2012    Referred to [facility] Dr. Domingo Cocking in 2013 -     Pneumonia, viral 06/20/2013   Taste absent    Past Surgical History:  Procedure Laterality Date   CHOLECYSTECTOMY  2003   FOOT SURGERY  2008   HERNIA REPAIR  [phone]   MOUTH SURGERY     Patient Active Problem List   Diagnosis Date Noted   Excessive daytime sleepiness 04/04/2015   OSA on CPAP 04/04/2015   Essential hypertension 08/29/2014   Dyspnea 08/28/2014   Pneumonia, viral 06/20/2013   Headaches, cluster 06/20/2013   Sleep apnea with use of continuous  positive airway pressure (CPAP) 08/02/2012   Insomnia, persistent 07/05/2012   Obstructive sleep apnea 07/05/2012   Persistent headaches 07/05/2012    PCP: Mayra Neer MD  REFERRING PROVIDER: Mayra Neer MD  REFERRING DIAG: M54.50 - Low back pain  Rationale for Evaluation and Treatment Rehabilitation  THERAPY DIAG:  Other low back pain  Abnormal posture  Difficulty in walking, not elsewhere classified  ONSET DATE: Fall 2022  SUBJECTIVE:  SUBJECTIVE STATEMENT: Pt indicated getting some short term relief after last visit but did have pain return in last few days.  Pt indicated soreness for a day or so after last visit.  Similar overall pain quality reported.   PERTINENT HISTORY:  High cholesterol, HTN  PAIN:  NPRS scale: at worst 10/10 yesterday, at current 6/10, at best : 3/10 Pain location: low back (center to Rt) Pain description: constant, dull/achy  Aggravating factors: walking, standing Relieving factors: pain medicine   PRECAUTIONS: None  WEIGHT BEARING RESTRICTIONS No  FALLS:  Has patient fallen in last 6 months? 2 Any falling fear?  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  No lateral shift noted in standing. Equal iliac crest heights  PALPATION: 10/23/2021 Tenderness and trigger points noted in bilateral lumbar paraspinals, QL.    LUMBAR ROM:   Active  AROM  10/23/2021  Flexion Movement to ankle c ERP, painful arc upon return  Extension 50% c ERP , repeated x5 in standing no change ERP continued  Right lateral flexion   Left lateral flexion   Right rotation   Left rotation    (Blank rows = not tested)  LOWER EXTREMITY ROM:      Right 10/23/2021 Left 10/23/2021  Hip flexion    Hip extension    Hip abduction    Hip adduction    Hip internal rotation    Hip external rotation    Knee flexion    Knee extension    Ankle dorsiflexion    Ankle plantarflexion    Ankle inversion    Ankle eversion     (Blank rows = not tested)  LOWER EXTREMITY MMT:    MMT Right 10/23/2021 Left 10/23/2021  Hip flexion 5/5 5/5  Hip extension    Hip abduction    Hip adduction    Hip internal rotation    Hip external rotation    Knee flexion 5/5 5/5  Knee extension 5/5 5/5  Ankle dorsiflexion 5/5 5/5  Ankle plantarflexion    Ankle inversion    Ankle eversion     (Blank rows = not tested)  LUMBAR SPECIAL TESTS:  10/23/2021 (-) slump bilateral  PAIVM testing:  cPAs with pain and hypomobility L1-L5, increased pain response L3-L5 FUNCTIONAL TESTS:  10/23/2021 18 inch transfer: no UE c pain noted Lt SLS : 5 seconds, Rt SLS 8 seconds  GAIT: 10/23/2021 Independent ambulation     TODAY'S TREATMENT  10/29/2021 Therex: Supine lumbar trunk rotation 15 sec x 3 bilateral Supine bridge 2 x 10 Nustep UE/LE lvl 5 6 mins for range of movement Lumbar extension x 5 (home program add)  Review of rest of HEP for knowledge conformation.   Manual: Compression to Lt QL, skilled palpation during DN  Dry needling: Concordant symptoms noted from bilateral L4, L5 lumbar paraspinals and multifidi, Lt QL.  No  adverse reactions noted post treatment.  Education given per handout.    Estim IFC to tolerance 10 mins lower back in prone after manual/DN c moist heat applied  10/23/2021 Therex:    HEP instruction/performance c cues for techniques, handout provided.  Trial set performed of each for comprehension and symptom assessment.  See below for exercise list  Manual: G2 cPA L1-L3, skilled palpation during DN  Dry needling: Concordant symptoms noted from bilateral L4, L5 lumbar paraspinals and multifidi.  No adverse reactions noted post treatment.  Education given per handout.    Estim IFC to tolerance 10 mins lower back in prone after manual/DN.    PATIENT EDUCATION:  10/23/2021 Education details: HEP, POC, DN Person educated: Patient Education method: Explanation, Demonstration, Verbal cues, and Handouts Education comprehension: verbalized understanding, returned demonstration, and verbal cues required    HOME EXERCISE PROGRAM: Access Code: [email or web address] URL: [email or web address] Date: 10/23/2021 Prepared by: Scot Jun  Exercises - Supine Lower Trunk Rotation  - 2-3 x daily - 7 x weekly - 1 sets - 3-5 reps - 15 hold - Supine Single Knee to Chest Stretch  - 2-3 x daily - 7 x weekly - 1 sets - 3-5 reps - 15 hold - Seated Multifidi Isometric  - 2-3 x daily - 7 x weekly - 1 sets - 10 reps - 5-10 hold - Shoulder External Rotation and Scapular Retraction  - 2-3 x daily - 7 x weekly - 1 sets - 10 reps - 2 hold  ASSESSMENT:  CLINICAL IMPRESSION: Continued complaints of elevated pain symptoms that may benefit from progressive skilled PT services.  Evidence of combination of mobility restrictions c myofascial pain present.  Continued treatment for goals of reduced guarding/pain to allow improve functional movement tolerance.    OBJECTIVE IMPAIRMENTS decreased activity tolerance, decreased balance, decreased endurance, decreased mobility, difficulty walking, decreased ROM,  hypomobility, increased fascial restrictions, impaired perceived functional ability, increased muscle spasms, impaired flexibility, improper body mechanics, postural dysfunction, and pain.   ACTIVITY LIMITATIONS carrying, lifting, bending, sitting, standing, squatting, sleeping, stairs, transfers, bed mobility, bathing, locomotion level, and caring for others  PARTICIPATION LIMITATIONS: meal prep, cleaning, laundry, interpersonal relationship, community activity, and yard work  PERSONAL FACTORS Time since onset of injury/illness/exacerbation , High cholesterol, HTNare also affecting patient's functional outcome.   REHAB POTENTIAL: Good  CLINICAL DECISION MAKING: Stable/uncomplicated  EVALUATION COMPLEXITY: Low   GOALS: Goals reviewed with patient? Yes  Short term PT Goals (target date for Short term goals are 3 weeks 11/13/2021) Patient will demonstrate independent use of home exercise program to maintain progress from in clinic treatments. Goal status: on going - assessed 10/29/2021   Long term PT goals (target dates for all long term goals are 10 weeks  01/01/2022 )   1. Patient will demonstrate/report pain at worst less than or equal to 2/10 to facilitate minimal limitation in daily activity secondary to pain symptoms. Goal status: New   2. Patient will demonstrate independent use of home exercise program to facilitate ability to maintain/progress functional gains from skilled physical therapy services. Goal status: New   3. Patient will demonstrate FOTO outcome > or = 59 % to indicate reduced disability due to condition. Goal status: New   4. Patient will demonstrate lumbar extension 100 % WFL s symptoms to facilitate upright standing, walking posture at PLOF s limitation. Goal status: New   5.  Patient will demonstrate/report ability to ascend/descend stairs at home with reciprocal gait s limitation due to back  symptoms.    Goal status: New   6.  Patient will  demonstrate/report ability to perform caregiver activity for family s restriction due to back symptoms.   Goal status: New    PLAN: PT FREQUENCY: 1-2x/week  PT DURATION: 10 weeks  PLANNED INTERVENTIONS: Therapeutic exercises, Therapeutic activity, Neuro Muscular re-education, Balance training, Gait training, Patient/Family education, Joint mobilization, Stair training, DME instructions, Dry Needling, Electrical stimulation, Cryotherapy, Moist heat, Taping, Ultrasound, Ionotophoresis '4mg'$ /ml Dexamethasone, and Manual therapy.  All included unless contraindicated   PLAN FOR NEXT SESSION: Dry needling, manual, estim as desired.  Progressive mobility improvements.    Scot Jun, PT, DPT, OCS, ATC 10/29/21  8:53 AM

## 2021-11-07 ENCOUNTER — Encounter: Payer: BC Managed Care – PPO | Admitting: Rehabilitative and Restorative Service Providers"

## 2021-11-14 ENCOUNTER — Encounter: Payer: BC Managed Care – PPO | Admitting: Rehabilitative and Restorative Service Providers"

## 2021-11-20 ENCOUNTER — Ambulatory Visit: Payer: BC Managed Care – PPO | Admitting: Rehabilitative and Restorative Service Providers"

## 2021-11-20 ENCOUNTER — Encounter: Payer: Self-pay | Admitting: Rehabilitative and Restorative Service Providers"

## 2021-11-20 DIAGNOSIS — M5459 Other low back pain: Secondary | ICD-10-CM | POA: Diagnosis not present

## 2021-11-20 DIAGNOSIS — R262 Difficulty in walking, not elsewhere classified: Secondary | ICD-10-CM

## 2021-11-20 DIAGNOSIS — R293 Abnormal posture: Secondary | ICD-10-CM

## 2021-11-20 NOTE — Therapy (Signed)
OUTPATIENT PHYSICAL THERAPY TREATMENT   Patient Name: ZYHEIR DAFT MRN: [id number] DOB:05-10-1965, 56 y.o., male Today's Date: 11/20/2021  END OF SESSION:   PT End of Session - 11/20/21 0817     Visit Number 3    Number of Visits 20    Date for PT Re-Evaluation 01/01/22    Authorization Type [employer] $50 copay , 30 visits    Authorization - Visit Number 3    Authorization - Number of Visits 30    PT Start Time 0759    PT Stop Time 0839    PT Time Calculation (min) 40 min    Activity Tolerance Patient tolerated treatment well    Behavior During Therapy [facility] for tasks assessed/performed               Past Medical History:  Diagnosis Date   Bell's palsy 2008   Dr Jannifer Franklin   Dyspnea 08/28/2014   Followed in Pulmonary clinic/ [facility]/ [facility]  - 08/28/2014  Walked RA x 3 laps @ 185 ft each stopped due to end of study, fast pace, no doe or desats    - trial off acei 08/28/2014 >> improved 10/15/2014  - PFT's  10/21/2014  FEV1 3.40 (87 % ) ratio 88  p 6 % improvement from saba with DLCO  83 % corrects to 124 % for alv volume      Excessive daytime sleepiness 04/04/2015   Headache(784.0)    Headaches, cluster 06/20/2013   High cholesterol    Hypertension    Insomnia    Obstructive sleep apnea 07/05/2012   Patient diagnosed with OSA and using CPAP at 6 cm water , with residual AHI of 0.8 on 07-04-12 . [facility] is his  DME .     Persistent headaches 07/05/2012    [facility] Dr. Domingo Cocking in 2013 -     Pneumonia, viral 06/20/2013   Taste absent    Past Surgical History:  Procedure Laterality Date   CHOLECYSTECTOMY  2003   FOOT SURGERY  2008   HERNIA REPAIR  [id number]   MOUTH SURGERY     Patient Active Problem List   Diagnosis Date Noted   Excessive daytime sleepiness 04/04/2015   OSA on CPAP 04/04/2015   Essential hypertension 08/29/2014   Dyspnea 08/28/2014   Pneumonia, viral 06/20/2013   Headaches, cluster 06/20/2013   Sleep apnea with use of  continuous positive airway pressure (CPAP) 08/02/2012   Insomnia, persistent 07/05/2012   Obstructive sleep apnea 07/05/2012   Persistent headaches 07/05/2012    PCP: Mayra Neer MD  REFERRING PROVIDER: Mayra Neer MD  REFERRING DIAG: M54.50 - Low back pain  Rationale for Evaluation and Treatment Rehabilitation  THERAPY DIAG:  Other low back pain  Abnormal posture  Difficulty in walking, not elsewhere classified  ONSET DATE: Fall 2022  SUBJECTIVE:  SUBJECTIVE STATEMENT: Pt indicated doing fairly well on most days.  Pt indicated doing yard work and taking care of family.  Pt indicated 4-5/10 at worst prior to yesterday.  Up to 9/10 after picking up something and tractor riding could have been that trouble.   PERTINENT HISTORY:  High cholesterol, HTN  PAIN:  NPRS scale: up to 9/10 at worst in last 24 hours.  Pain location: low back (center to Rt) Pain description: constant, dull/achy  Aggravating factors: walking, standing Relieving factors: pain medicine   PRECAUTIONS: None  WEIGHT BEARING RESTRICTIONS No  FALLS:  Has patient fallen in last 6 months? 2 Any falling fear?  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  Small farm with cattle  PATIENT GOALS  : get rid of pain   OBJECTIVE:   PATIENT SURVEYS:  11/20/2021 : FOTO update 60   10/23/2021 FOTO intake:  44  predicted:  59  SCREENING FOR RED FLAGS: 10/23/2021 Bowel or bladder incontinence: No Cauda equina syndrome: No  COGNITION: 10/23/2021  Overall cognitive status: WFL normal    SENSATION: 10/23/2021 [facility]  MUSCLE  LENGTH: 10/23/2021 None tested today  POSTURE:  10/23/2021 reduced lumbar lordosis in standing c mild forward trunk posture.  No lateral shift noted in standing. Equal iliac crest heights  PALPATION: 10/23/2021 Tenderness and trigger points noted in bilateral lumbar paraspinals, QL.    LUMBAR ROM:   Active  AROM  10/23/2021 AROM  11/20/2021  Flexion Movement to ankle c ERP, painful arc upon return Movement to ankles, pulling in back.   Extension 50% c ERP , repeated x5 in standing no change ERP continued 75 % c end range pain lumbar  X 5 in standing, improved to 100 %  Right lateral flexion    Left lateral flexion    Right rotation    Left rotation     (Blank rows = not tested)  LOWER EXTREMITY ROM:      Right 10/23/2021 Left 10/23/2021  Hip flexion    Hip extension    Hip abduction    Hip adduction    Hip internal rotation    Hip external rotation    Knee flexion    Knee extension    Ankle dorsiflexion    Ankle plantarflexion    Ankle inversion    Ankle eversion     (Blank rows = not tested)  LOWER EXTREMITY MMT:    MMT Right 10/23/2021 Left 10/23/2021  Hip flexion 5/5 5/5  Hip extension    Hip abduction    Hip adduction    Hip internal rotation    Hip external rotation    Knee flexion 5/5 5/5  Knee extension 5/5 5/5  Ankle dorsiflexion 5/5 5/5  Ankle plantarflexion    Ankle inversion    Ankle eversion     (Blank rows = not tested)  LUMBAR SPECIAL TESTS:  10/23/2021 (-) slump bilateral  PAIVM testing:  cPAs with pain and hypomobility L1-L5, increased pain response L3-L5  FUNCTIONAL TESTS:  10/23/2021 18 inch transfer: no UE c pain noted Lt SLS : 5 seconds, Rt SLS 8 seconds  GAIT: 10/23/2021 Independent ambulation     TODAY'S TREATMENT  11/20/2021 Therex: Prone opposite arm/leg lift 3 sec hold x 10 bilateral c moist heat on lumbar after manual/dry needling Supine lumbar trunk rotation 15 sec  x 3 bilateral SKC 15 sec x 3 bilateral Supine bridge  x15 Lumbar extension x 5  Double leg press 87 lbs 2 x 15 Nustep UE/LE lvl 6 10 mins for range of movement  Manual: Compression to Lt QL, skilled palpation during DN  Dry needling: Concordant symptoms noted from bilateral L4, L5 lumbar paraspinals and multifidi, Lt QL.  No adverse reactions noted post treatment.  Education given per handout.     10/29/2021 Therex: Supine lumbar trunk rotation 15 sec x 3 bilateral Supine bridge 2 x 10 Nustep UE/LE lvl 5 6 mins for range of movement Lumbar extension x 5 (home program add)  Review of rest of HEP for knowledge conformation.   Manual: Compression to Lt QL, skilled palpation during DN  Dry needling: Concordant symptoms noted from bilateral L4, L5 lumbar paraspinals and multifidi, Lt QL.  No adverse reactions noted post treatment.  Education given per handout.    Estim IFC to tolerance 10 mins lower back in prone after manual/DN c moist heat applied  10/23/2021 Therex:    HEP instruction/performance c cues for techniques, handout provided.  Trial set performed of each for comprehension and symptom assessment.  See below for exercise list  Manual: G2 cPA L1-L3, skilled palpation during DN  Dry needling: Concordant symptoms noted from bilateral L4, L5 lumbar paraspinals and multifidi.  No adverse reactions noted post treatment.  Education given per handout.    Estim IFC to tolerance 10 mins lower back in prone after manual/DN.    PATIENT EDUCATION:  10/23/2021 Education details: HEP, POC, DN Person educated: Patient Education method: Explanation, Demonstration, Verbal cues, and Handouts Education comprehension: verbalized understanding, returned demonstration, and verbal cues required    HOME EXERCISE PROGRAM: Access Code: [email or web address] URL: [email or web address] Date: 10/23/2021 Prepared by: Scot Jun  Exercises - Supine Lower Trunk Rotation  - 2-3 x daily - 7 x weekly - 1 sets - 3-5 reps - 15 hold -  Supine Single Knee to Chest Stretch  - 2-3 x daily - 7 x weekly - 1 sets - 3-5 reps - 15 hold - Seated Multifidi Isometric  - 2-3 x daily - 7 x weekly - 1 sets - 10 reps - 5-10 hold - Shoulder External Rotation and Scapular Retraction  - 2-3 x daily - 7 x weekly - 1 sets - 10 reps - 2 hold  ASSESSMENT:  CLINICAL IMPRESSION: Pt in general reported improved symptom management since last visit.  Longer duration between visits due to caregiver responsibility but overall favorable response.  Increase in severity in last day or so following tractor and lifting activity.  Discussed breaking up time on tractor to help reduce symptom response.  Continued focus on improvement in myofascial complaints in lumbar spine and improving functional movement and strength.    OBJECTIVE IMPAIRMENTS decreased activity tolerance, decreased balance, decreased endurance, decreased mobility, difficulty walking, decreased ROM, hypomobility, increased fascial restrictions, impaired perceived functional ability, increased muscle spasms, impaired flexibility, improper body mechanics, postural dysfunction, and pain.   ACTIVITY LIMITATIONS carrying, lifting, bending, sitting, standing, squatting, sleeping, stairs, transfers, bed mobility, bathing, locomotion level, and caring for others  PARTICIPATION LIMITATIONS: meal prep, cleaning, laundry, interpersonal relationship, community activity, and yard work  PERSONAL FACTORS Time since onset of injury/illness/exacerbation , High cholesterol, HTNare also affecting patient's functional outcome.   REHAB POTENTIAL: Good  CLINICAL DECISION MAKING: Stable/uncomplicated  EVALUATION COMPLEXITY: Low   GOALS: Goals reviewed with patient? Yes  Short term PT Goals (target date for Short  term goals are 3 weeks 11/13/2021) Patient will demonstrate independent use of home exercise program to maintain progress from in clinic treatments. Goal status: Met   Long term PT goals (target  dates for all long term goals are 10 weeks  01/01/2022 )   1. Patient will demonstrate/report pain at worst less than or equal to 2/10 to facilitate minimal limitation in daily activity secondary to pain symptoms. Goal status: on going - 11/20/2021   2. Patient will demonstrate independent use of home exercise program to facilitate ability to maintain/progress functional gains from skilled physical therapy services. Goal status: on going - 11/20/2021   3. Patient will demonstrate FOTO outcome > or = 59 % to indicate reduced disability due to condition. Goal status: MET - 11/20/2021   4. Patient will demonstrate lumbar extension 100 % WFL s symptoms to facilitate upright standing, walking posture at PLOF s limitation. Goal status: on going - 11/20/2021   5.  Patient will demonstrate/report ability to ascend/descend stairs at home with reciprocal gait s limitation due to back symptoms.    Goal status: on going - 11/20/2021   6.  Patient will demonstrate/report ability to perform caregiver activity for family s restriction due to back symptoms.   Goal status: on going - 11/20/2021    PLAN: PT FREQUENCY: 1-2x/week  PT DURATION: 10 weeks  PLANNED INTERVENTIONS: Therapeutic exercises, Therapeutic activity, Neuro Muscular re-education, Balance training, Gait training, Patient/Family education, Joint mobilization, Stair training, DME instructions, Dry Needling, Electrical stimulation, Cryotherapy, Moist heat, Taping, Ultrasound, Ionotophoresis 24m/ml Dexamethasone, and Manual therapy.  All included unless contraindicated   PLAN FOR NEXT SESSION: Dry needling, manual, estim as desired.  Progressive mobility improvements/strengthening for LE.    MScot Jun PT, DPT, OCS, ATC 11/20/21  8:36 AM

## 2021-11-21 ENCOUNTER — Encounter: Payer: BC Managed Care – PPO | Admitting: Rehabilitative and Restorative Service Providers"

## 2021-11-25 ENCOUNTER — Ambulatory Visit: Payer: BC Managed Care – PPO | Admitting: Rehabilitative and Restorative Service Providers"

## 2021-11-25 ENCOUNTER — Encounter: Payer: Self-pay | Admitting: Rehabilitative and Restorative Service Providers"

## 2021-11-25 DIAGNOSIS — R293 Abnormal posture: Secondary | ICD-10-CM | POA: Diagnosis not present

## 2021-11-25 DIAGNOSIS — M5459 Other low back pain: Secondary | ICD-10-CM

## 2021-11-25 DIAGNOSIS — R262 Difficulty in walking, not elsewhere classified: Secondary | ICD-10-CM

## 2021-11-25 NOTE — Therapy (Addendum)
OUTPATIENT PHYSICAL THERAPY TREATMENT /DISCHARGE   Patient Name: Julian Oneal MRN: [id number] DOB:03/21/1965, 56 y.o., male Today's Date: 11/25/2021  END OF SESSION:   PT End of Session - 11/25/21 0811     Visit Number 4    Number of Visits 20    Date for PT Re-Evaluation 01/01/22    Authorization Type [employer] $50 copay , 30 visits    Authorization - Visit Number 3    Authorization - Number of Visits 30    PT Start Time 0800    PT Stop Time 0839    PT Time Calculation (min) 39 min    Activity Tolerance Patient tolerated treatment well    Behavior During Therapy [facility] for tasks assessed/performed                Past Medical History:  Diagnosis Date   Bell's palsy 2008   Dr Jannifer Franklin   Dyspnea 08/28/2014   Followed in Pulmonary clinic/ [facility]/ [facility]  - 08/28/2014  Walked RA x 3 laps @ 185 ft each stopped due to end of study, fast pace, no doe or desats    - trial off acei 08/28/2014 >> improved 10/15/2014  - PFT's  10/21/2014  FEV1 3.40 (87 % ) ratio 88  p 6 % improvement from saba with DLCO  83 % corrects to 124 % for alv volume      Excessive daytime sleepiness 04/04/2015   Headache(784.0)    Headaches, cluster 06/20/2013   High cholesterol    Hypertension    Insomnia    Obstructive sleep apnea 07/05/2012   Patient diagnosed with OSA and using CPAP at 6 cm water , with residual AHI of 0.8 on 07-04-12 . [facility] is his  DME .     Persistent headaches 07/05/2012    [facility] Dr. Domingo Cocking in 2013 -     Pneumonia, viral 06/20/2013   Taste absent    Past Surgical History:  Procedure Laterality Date   CHOLECYSTECTOMY  2003   FOOT SURGERY  2008   HERNIA REPAIR  [phone]   MOUTH SURGERY     Patient Active Problem List   Diagnosis Date Noted   Excessive daytime sleepiness 04/04/2015   OSA on CPAP 04/04/2015   Essential hypertension 08/29/2014   Dyspnea 08/28/2014   Pneumonia, viral 06/20/2013   Headaches, cluster 06/20/2013   Sleep apnea  with use of continuous positive airway pressure (CPAP) 08/02/2012   Insomnia, persistent 07/05/2012   Obstructive sleep apnea 07/05/2012   Persistent headaches 07/05/2012    PCP: Mayra Neer MD  REFERRING PROVIDER: Mayra Neer MD  REFERRING DIAG: M54.50 - Low back pain  Rationale for Evaluation and Treatment Rehabilitation  THERAPY DIAG:  Other low back pain  Abnormal posture  Difficulty in walking, not elsewhere classified  ONSET DATE: Fall 2022  SUBJECTIVE:  SUBJECTIVE STATEMENT: Pt indicated complaints at 4/10 at most today.  Pt indicated he was able to sleep in bed instead of a couch and felt that was helpful.   PERTINENT HISTORY:  High cholesterol, HTN  PAIN:  NPRS scale:up to 4/10 today Pain location: low back (center to Rt) Pain description: constant, dull/achy  Aggravating factors: walking, standing Relieving factors: pain medicine   PRECAUTIONS: None  WEIGHT BEARING RESTRICTIONS No  FALLS:  Has patient fallen in last 6 months? 2 Any falling fear?  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   No lateral shift noted in standing. Equal iliac crest heights  PALPATION: 10/23/2021 Tenderness and trigger points noted in bilateral lumbar paraspinals, QL.    LUMBAR ROM:   Active  AROM  10/23/2021 AROM  11/20/2021  Flexion Movement to ankle c ERP, painful arc upon return Movement to ankles, pulling in back.   Extension 50% c ERP , repeated x5 in standing no change ERP continued 75 % c end range pain lumbar  X 5 in standing, improved to 100 %  Right lateral flexion    Left lateral flexion    Right rotation    Left rotation     (Blank rows = not tested)  LOWER EXTREMITY ROM:      Right 10/23/2021 Left 10/23/2021  Hip flexion    Hip extension    Hip abduction    Hip adduction    Hip internal rotation    Hip external rotation    Knee flexion    Knee extension    Ankle dorsiflexion    Ankle plantarflexion    Ankle inversion    Ankle eversion     (Blank rows = not tested)  LOWER EXTREMITY MMT:    MMT Right 10/23/2021 Left 10/23/2021  Hip flexion 5/5 5/5  Hip extension    Hip abduction    Hip adduction    Hip internal rotation    Hip external rotation    Knee flexion 5/5 5/5  Knee extension 5/5 5/5  Ankle dorsiflexion 5/5 5/5  Ankle plantarflexion    Ankle inversion    Ankle eversion     (Blank rows = not tested)  LUMBAR SPECIAL TESTS:  10/23/2021 (-) slump bilateral  PAIVM testing:  cPAs with pain and hypomobility L1-L5, increased pain response L3-L5  FUNCTIONAL TESTS:  10/23/2021 18 inch transfer: no UE c pain noted Lt SLS : 5 seconds, Rt SLS 8 seconds  GAIT: 10/23/2021 Independent ambulation     TODAY'S TREATMENT  11/25/2021 Therex: Standing in lunge thoracic rotation c horizontal abduction green band x 15 bilateral Standing blue band rows, gh ext 20x each Lumbar extension x 5  Double leg press 87 lbs x 15 , x15 single leg bilaterally 62 lbs Nustep UE/LE lvl 6 10 mins for range  of movement  Manual: Compression to Lt/Rt multifid, paraspinals,  skilled palpation during DN.  Regional PA mobs g4 for mid thoracic region.   Dry needling: Concordant symptoms noted from bilateral L2, L3 lumbar paraspinals and multifidi.  No adverse reactions noted post treatment.  Education given per handout.    11/20/2021 Therex: Prone opposite arm/leg lift 3 sec hold x 10 bilateral c moist heat on lumbar after manual/dry needling Supine lumbar trunk rotation 15 sec x 3 bilateral SKC 15 sec x 3 bilateral Supine bridge x15 Lumbar extension x 5  Double leg press 87 lbs 2 x 15 Nustep UE/LE lvl 6 10 mins for range of movement  Manual: Compression to Lt QL, skilled palpation during DN  Dry needling: Concordant symptoms noted from bilateral L4, L5 lumbar paraspinals and multifidi, Lt QL.  No adverse reactions noted post treatment.  Education given per handout.     10/29/2021 Therex: Supine lumbar trunk rotation 15 sec x 3 bilateral Supine bridge 2 x 10 Nustep UE/LE lvl 5 6 mins for range of movement Lumbar extension x 5 (home program add)  Review of rest of HEP for knowledge conformation.   Manual: Compression to Lt QL, skilled palpation during DN  Dry needling: Concordant symptoms noted from bilateral L4, L5 lumbar paraspinals and multifidi, Lt QL.  No adverse reactions noted post treatment.  Education given per handout.    Estim IFC to tolerance 10 mins lower back in prone after manual/DN c moist heat applied  PATIENT EDUCATION:  10/23/2021 Education details: HEP, POC, DN Person educated: Patient Education method: Explanation, Demonstration, Verbal cues, and Handouts Education comprehension: verbalized understanding, returned demonstration, and verbal cues required    HOME EXERCISE PROGRAM: Access Code: [email or web address] URL: [email or web address] Date: 10/23/2021 Prepared by: Scot Jun  Exercises - Supine Lower Trunk Rotation  - 2-3 x daily - 7 x weekly - 1 sets - 3-5 reps - 15 hold - Supine Single Knee to Chest  Stretch  - 2-3 x daily - 7 x weekly - 1 sets - 3-5 reps - 15 hold - Seated Multifidi Isometric  - 2-3 x daily - 7 x weekly - 1 sets - 10 reps - 5-10 hold - Shoulder External Rotation and Scapular Retraction  - 2-3 x daily - 7 x weekly - 1 sets - 10 reps - 2 hold  ASSESSMENT:  CLINICAL IMPRESSION: Very mild restriction in PAIVM in mid thoracic region today.  Encouraged focus on use of consistent movement throughout day in lumbar stretching with daily strengthening intervention for postural support.  Pt does continue to report improvement in symptoms c dry needling/manual intervention.    OBJECTIVE IMPAIRMENTS decreased activity tolerance, decreased balance, decreased endurance, decreased mobility, difficulty walking, decreased ROM, hypomobility, increased fascial restrictions, impaired perceived functional ability, increased muscle spasms, impaired flexibility, improper body mechanics, postural dysfunction, and pain.   ACTIVITY LIMITATIONS carrying, lifting, bending, sitting, standing, squatting, sleeping, stairs, transfers, bed mobility, bathing, locomotion level, and caring for others  PARTICIPATION LIMITATIONS: meal prep, cleaning, laundry, interpersonal relationship, community activity, and yard work  PERSONAL FACTORS Time since onset of injury/illness/exacerbation , High cholesterol, HTNare also affecting patient's functional outcome.   REHAB POTENTIAL: Good  CLINICAL DECISION MAKING: Stable/uncomplicated  EVALUATION COMPLEXITY: Low   GOALS: Goals reviewed with patient? Yes  Short term PT Goals (target date for Short term goals are 3 weeks 11/13/2021) Patient will demonstrate independent use of home exercise program to maintain progress from in clinic treatments. Goal status: Met   Long  term PT goals (target dates for all long term goals are 10 weeks  01/01/2022 )   1. Patient will demonstrate/report pain at worst less than or equal to 2/10 to facilitate minimal limitation in  daily activity secondary to pain symptoms. Goal status: on going - 11/20/2021   2. Patient will demonstrate independent use of home exercise program to facilitate ability to maintain/progress functional gains from skilled physical therapy services. Goal status: on going - 11/20/2021   3. Patient will demonstrate FOTO outcome > or = 59 % to indicate reduced disability due to condition. Goal status: MET - 11/20/2021   4. Patient will demonstrate lumbar extension 100 % WFL s symptoms to facilitate upright standing, walking posture at PLOF s limitation. Goal status: on going - 11/20/2021   5.  Patient will demonstrate/report ability to ascend/descend stairs at home with reciprocal gait s limitation due to back symptoms.    Goal status: on going - 11/20/2021   6.  Patient will demonstrate/report ability to perform caregiver activity for family s restriction due to back symptoms.   Goal status: on going - 11/20/2021    PLAN: PT FREQUENCY: 1-2x/week  PT DURATION: 10 weeks  PLANNED INTERVENTIONS: Therapeutic exercises, Therapeutic activity, Neuro Muscular re-education, Balance training, Gait training, Patient/Family education, Joint mobilization, Stair training, DME instructions, Dry Needling, Electrical stimulation, Cryotherapy, Moist heat, Taping, Ultrasound, Ionotophoresis 93m/ml Dexamethasone, and Manual therapy.  All included unless contraindicated   PLAN FOR NEXT SESSION: Dry needling, manual. Progressive mobility improvements/strengthening for LE.    MScot Jun PT, DPT, OCS, ATC 11/25/21  8:39 AM   PHYSICAL THERAPY DISCHARGE SUMMARY  Visits from Start of Care:  4  Current functional level related to goals / functional outcomes: See note   Remaining deficits: See note   Education / Equipment: HEP  Patient goals were partially met. Patient is being discharged due to not returning since the last visit.  MScot Jun PT, DPT, OCS, ATC 01/01/22  9:16 AM

## 2021-12-03 ENCOUNTER — Encounter: Payer: BC Managed Care – PPO | Admitting: Rehabilitative and Restorative Service Providers"

## 2022-01-26 DIAGNOSIS — J019 Acute sinusitis, unspecified: Secondary | ICD-10-CM | POA: Diagnosis not present

## 2022-01-26 DIAGNOSIS — G4700 Insomnia, unspecified: Secondary | ICD-10-CM | POA: Diagnosis not present

## 2022-01-26 NOTE — Progress Notes (Signed)
Cardiology Office Note:    Date:  02/09/2022   ID:  Julian Oneal, DOB 11/13/1965, MRN [id number]  PCP:  Mayra Neer, MD   [place]  Cardiologist:  New to me  Electrophysiologist:  None       Referring MD: Mayra Neer, MD   Chief Complaint:   Dyspnea     Patient Profile:    Julian Oneal is a 56 y.o. male with:  Hypertension Hyperlipidemia Family history of CAD Coronary CTA 7/17: Calcium score 0; no CAD CT cardiac scoring 10/2018: Calcium score 0 Echocardiogram 10/2018: EF 60-65 OSA on CPAP Chronic headaches  Prior CV studies: Echocardiogram 10/25/2018 EF 60-65, normal RVSF  CT Cardiac Scoring 10/03/2018 IMPRESSION: Coronary calcium score of 0.  Coronary CTA 09/19/15 FINDINGS: Non-cardiac: See separate report from [facility]. No significant findings on limited lung and soft tissue windows. Calcium Score:  0 Coronary Arteries: Right dominant with no anomalies LM: Normal LAD:  Normal D1: Normal Circumflex: Normal OM1: Normal OM2: Normal RCA:  Dominant and normal PDA: Normal PLA:  Normal IMPRESSION: 1) Calcium Score 0 2) Normal right dominant coronary arteries  Carotid US 01/13/2013 No hemodynamically significant ICA  stenosis bilaterally  History of Present Illness: 56 y.o. never seen by me in person Had televisit 3.5 years ago. See testing above No documented cardiac issues  Has been seen by PA He is overall doing well without chest pain, syncope, orthopnea, leg edema.  He has chronic dyspnea on exertion with certain activities.  This has been chronic and unchanged since he had pneumonia years ago.  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 His stress level is better.  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 [employer] and has a small farm with cows   No chest  pain ECG normal today     Past Medical History:  Diagnosis Date   Bell's palsy 2008   Dr Jannifer Franklin   Dyspnea 08/28/2014   Followed in Pulmonary clinic/ [facility]/ [facility]  - 08/28/2014  Walked RA x 3 laps @ 185 ft each stopped due to end of study, fast pace, no doe or desats    - trial off acei 08/28/2014 >> improved 10/15/2014  - PFT's  10/21/2014  FEV1 3.40 (87 % ) ratio 88  p 6 % improvement from saba with DLCO  83 % corrects to 124 % for alv volume      Excessive daytime sleepiness 04/04/2015   Headache(784.0)    Headaches, cluster 06/20/2013   High cholesterol    Hypertension    Insomnia    Obstructive sleep apnea 07/05/2012   Patient diagnosed with OSA and using CPAP at 6 cm water , with residual AHI of 0.8 on 07-04-12 . [facility] is his  DME .     Persistent headaches 07/05/2012    Referred to [facility] Dr. Domingo Cocking in 2013 -     Pneumonia, viral 06/20/2013   Taste absent     Current Medications: Current Meds  Medication Sig   ALPRAZolam (XANAX) 1 MG tablet Take by mouth.   cetirizine (ZYRTEC) 10 MG tablet Take 10 mg by mouth daily as needed.    CIALIS 5 MG tablet Take 5 mg by mouth daily.    fenofibrate 160 MG tablet Take  160 mg by mouth daily.   omeprazole (PRILOSEC) 20 MG capsule Take 20 mg by mouth daily.   Plecanatide (TRULANCE) 3 MG TABS Take 1 tablet by mouth daily.   sodium chloride 0.9 % nebulizer solution SMARTSIG:5 Milliliter(s) Via Inhaler As Directed   traZODone (DESYREL) 150 MG tablet TAKE 1 TABLET BY MOUTH EVERYDAY AT BEDTIME   valsartan (DIOVAN) 160 MG tablet TAKE 1 TABLET BY MOUTH EVERY DAY   venlafaxine XR (EFFEXOR-XR) 75 MG 24 hr capsule Take 75 mg by mouth daily with breakfast.     Allergies:   Amitriptyline, Silenor [doxepin hcl], Tribenzor [olmesartan-amlodipine-hctz], and Topiramate   Social History   Tobacco Use   Smoking status: Former    Packs/day: 0.25    Years: 5.00    Total pack years: 1.25    Types: Cigarettes    Quit date:  03/02/1990    Years since quitting: 31.9   Smokeless tobacco: Never  Vaping Use   Vaping Use: Never used  Substance Use Topics   Alcohol use: Yes    Alcohol/week: 0.0 standard drinks of alcohol    Comment: occasionally   Drug use: No     Family Hx: The patient's family history includes Allergies in his sister; Alzheimer's disease in his paternal aunt and paternal uncle; Cancer - Prostate in his father and maternal grandfather; Heart disease in his maternal grandmother and paternal grandmother; Parkinson's disease in his maternal uncle; Prostate cancer in his maternal grandfather.  ROS   EKGs/Labs/Other Test Reviewed:    EKG:  02/09/2022 NSR rate 75 normal   Recent Labs: No results found for requested labs within last 365 days.   Recent Lipid Panel No results found for: "CHOL", "TRIG", "HDL", "CHOLHDL", "LDLCALC", "LDLDIRECT"  Labs obtained through [facility] - personally reviewed and interpreted: 01/18/2020: TC 146, HDL 44, LDL 82, triglycerides 107, creatinine 0.91, potassium 4, ALT 15  Risk Assessment/Calculations:      Physical Exam:    VS:  BP 128/80   Pulse 75   Ht '5\' 10"'$  (1.778 m)   Wt 175 lb 6.4 oz (79.6 kg)   SpO2 97%   BMI 25.17 kg/m     Wt Readings from Last 3 Encounters:  02/09/22 175 lb 6.4 oz (79.6 kg)  06/24/20 162 lb 9.6 oz (73.8 kg)  08/30/19 154 lb (69.9 kg)     Affect appropriate Healthy:  appears stated age HEENT: normal Neck supple with no adenopathy JVP normal no bruits no thyromegaly Lungs clear with no wheezing and good diaphragmatic motion Heart:  S1/S2 no murmur, no rub, gallop or click PMI normal Abdomen: benighn, BS positve, no tenderness, no AAA no bruit.  No HSM or HJR post cholecystectomy and hernia repair  Distal pulses intact with no bruits No edema Neuro non-focal Skin warm and dry No muscular weakness        ASSESSMENT & PLAN:    1. Essential hypertension -Well controlled.  Continue current medications and low sodium  Dash type diet.     2. Mixed hyperlipidemia Most recent lipid panel looks optimal.  This is managed by primary care.  He currently takes fenofibrate.  3. Family history of early CAD CT in 2020 demonstrated calcium score of 0.  He is doing well without anginal symptoms.  Electrocardiogram is normal.  Update calcium score     Dispo:  F/U PRN   Medication Adjustments/Labs and Tests Ordered: Current medicines are reviewed at length with the patient today.  Concerns regarding medicines are  outlined above.  Tests Ordered:  Calcium score   Medication Changes: No orders of the defined types were placed in this encounter.   Signed, Jenkins Rouge, MD  02/09/2022 8:17 AM    [facility] [place], [place], [place]  [address] Phone: [phone]; Fax: [phone]

## 2022-02-06 ENCOUNTER — Ambulatory Visit
Admission: RE | Admit: 2022-02-06 | Discharge: 2022-02-06 | Disposition: A | Discharge status: Home / Self Care | Payer: BC Managed Care – PPO | Source: Ambulatory Visit | Attending: Family Medicine | Admitting: Family Medicine

## 2022-02-06 ENCOUNTER — Other Ambulatory Visit: Payer: Self-pay | Admitting: Family Medicine

## 2022-02-06 DIAGNOSIS — R059 Cough, unspecified: Secondary | ICD-10-CM

## 2022-02-09 ENCOUNTER — Encounter: Payer: Self-pay | Admitting: Cardiovascular Disease

## 2022-02-09 ENCOUNTER — Ambulatory Visit: Payer: BC Managed Care – PPO | Attending: Cardiovascular Disease | Admitting: Cardiovascular Disease

## 2022-02-09 VITALS — BP 128/80 | HR 75 | Ht 70.0 in | Wt 175.4 lb

## 2022-02-09 DIAGNOSIS — E782 Mixed hyperlipidemia: Secondary | ICD-10-CM | POA: Diagnosis not present

## 2022-02-09 DIAGNOSIS — Z8249 Family history of ischemic heart disease and other diseases of the circulatory system: Secondary | ICD-10-CM

## 2022-02-09 DIAGNOSIS — I10 Essential (primary) hypertension: Secondary | ICD-10-CM | POA: Diagnosis not present

## 2022-02-09 NOTE — Patient Instructions (Signed)
Medication Instructions:  Your physician recommends that you continue on your current medications as directed. Please refer to the Current Medication list given to you today.  *If you need a refill on your cardiac medications before your next appointment, please call your pharmacy*  Lab Work: If you have labs (blood work) drawn today and your tests are completely normal, you will receive your results only by: [place] (if you have MyChart) OR A paper copy in the mail If you have any lab test that is abnormal or we need to change your treatment, we will call you to review the results.  Testing/Procedures: None ordered today.  Follow-Up: At [facility], you and your health needs are our priority.  As part of our continuing mission to provide you with exceptional heart care, we have created designated Provider Care Teams.  These Care Teams include your primary Cardiologist (physician) and Advanced Practice Providers (APPs -  Physician Assistants and Nurse Practitioners) who all work together to provide you with the care you need, when you need it.  We recommend signing up for the patient portal called "MyChart".  Sign up information is provided on this After Visit Summary.  MyChart is used to connect with patients for Virtual Visits (Telemedicine).  Patients are able to view lab/test results, encounter notes, upcoming appointments, etc.  Non-urgent messages can be sent to your provider as well.   To learn more about what you can do with MyChart, go to [email or web address].    Your next appointment:   As needed  The format for your next appointment:   In Person  Provider:   Jenkins Rouge, MD      Important Information About Sugar

## 2022-02-11 DIAGNOSIS — Z86010 Personal history of colonic polyps: Secondary | ICD-10-CM | POA: Diagnosis not present

## 2022-02-11 DIAGNOSIS — K5730 Diverticulosis of large intestine without perforation or abscess without bleeding: Secondary | ICD-10-CM | POA: Diagnosis not present

## 2022-02-11 DIAGNOSIS — D120 Benign neoplasm of cecum: Secondary | ICD-10-CM | POA: Diagnosis not present

## 2022-02-11 DIAGNOSIS — K648 Other hemorrhoids: Secondary | ICD-10-CM | POA: Diagnosis not present

## 2022-02-11 DIAGNOSIS — D123 Benign neoplasm of transverse colon: Secondary | ICD-10-CM | POA: Diagnosis not present

## 2022-02-17 ENCOUNTER — Other Ambulatory Visit: Payer: Self-pay

## 2022-02-17 ENCOUNTER — Emergency Department (HOSPITAL_BASED_OUTPATIENT_CLINIC_OR_DEPARTMENT_OTHER): Payer: BC Managed Care – PPO | Admitting: Radiology

## 2022-02-17 ENCOUNTER — Emergency Department (HOSPITAL_BASED_OUTPATIENT_CLINIC_OR_DEPARTMENT_OTHER)
Admission: EM | Admit: 2022-02-17 | Discharge: 2022-02-17 | Disposition: A | Discharge status: Home / Self Care | Payer: BC Managed Care – PPO | Attending: Emergency Medicine | Admitting: Emergency Medicine

## 2022-02-17 ENCOUNTER — Encounter (HOSPITAL_BASED_OUTPATIENT_CLINIC_OR_DEPARTMENT_OTHER): Payer: Self-pay

## 2022-02-17 DIAGNOSIS — R079 Chest pain, unspecified: Secondary | ICD-10-CM | POA: Diagnosis not present

## 2022-02-17 DIAGNOSIS — J069 Acute upper respiratory infection, unspecified: Secondary | ICD-10-CM | POA: Insufficient documentation

## 2022-02-17 DIAGNOSIS — R0602 Shortness of breath: Secondary | ICD-10-CM | POA: Diagnosis not present

## 2022-02-17 DIAGNOSIS — R059 Cough, unspecified: Secondary | ICD-10-CM | POA: Diagnosis not present

## 2022-02-17 DIAGNOSIS — R509 Fever, unspecified: Secondary | ICD-10-CM | POA: Diagnosis not present

## 2022-02-17 DIAGNOSIS — B9789 Other viral agents as the cause of diseases classified elsewhere: Secondary | ICD-10-CM | POA: Diagnosis not present

## 2022-02-17 DIAGNOSIS — Z79899 Other long term (current) drug therapy: Secondary | ICD-10-CM | POA: Insufficient documentation

## 2022-02-17 DIAGNOSIS — Z1152 Encounter for screening for COVID-19: Secondary | ICD-10-CM | POA: Insufficient documentation

## 2022-02-17 DIAGNOSIS — I10 Essential (primary) hypertension: Secondary | ICD-10-CM | POA: Insufficient documentation

## 2022-02-17 LAB — CBC
HCT: 37.6 % — ABNORMAL LOW (ref 39.0–52.0)
Hemoglobin: 12.9 g/dL — ABNORMAL LOW (ref 13.0–17.0)
MCH: 30.1 pg (ref 26.0–34.0)
MCHC: 34.3 g/dL (ref 30.0–36.0)
MCV: 87.9 fL (ref 80.0–100.0)
Platelets: 172 10*3/uL (ref 150–400)
RBC: 4.28 MIL/uL (ref 4.22–5.81)
RDW: 12.5 % (ref 11.5–15.5)
WBC: 5 10*3/uL (ref 4.0–10.5)
nRBC: 0 % (ref 0.0–0.2)

## 2022-02-17 LAB — GROUP A STREP BY PCR: Group A Strep by PCR: NOT DETECTED

## 2022-02-17 LAB — BASIC METABOLIC PANEL
Anion gap: 9 (ref 5–15)
BUN: 8 mg/dL (ref 6–20)
CO2: 25 mmol/L (ref 22–32)
Calcium: 9 mg/dL (ref 8.9–10.3)
Chloride: 103 mmol/L (ref 98–111)
Creatinine, Ser: 0.94 mg/dL (ref 0.61–1.24)
GFR, Estimated: >60 mL/min (ref >60)
Glucose, Bld: 110 mg/dL — ABNORMAL HIGH (ref 70–99)
Potassium: 3.9 mmol/L (ref 3.5–5.1)
Sodium: 137 mmol/L (ref 135–145)

## 2022-02-17 LAB — TROPONIN I (HIGH SENSITIVITY): Troponin I (High Sensitivity): 2 ng/L (ref <18)

## 2022-02-17 LAB — RESP PANEL BY RT-PCR (RSV, FLU A&B, COVID)  RVPGX2
Influenza A by PCR: NEGATIVE
Influenza B by PCR: NEGATIVE
Resp Syncytial Virus by PCR: NEGATIVE
SARS Coronavirus 2 by RT PCR: NEGATIVE

## 2022-02-17 MED ORDER — ACETAMINOPHEN 500 MG PO TABS
1000.0000 mg | ORAL_TABLET | Freq: Once | ORAL | Status: AC | PRN
  Administered 2022-02-17: 1000 mg via ORAL
  Filled 2022-02-17: qty 2

## 2022-02-17 MED ORDER — BENZONATATE 100 MG PO CAPS: 100.0000 mg | ORAL_CAPSULE | Freq: Three times a day (TID) | ORAL | 0 refills | Status: DC

## 2022-02-17 NOTE — ED Provider Notes (Signed)
[facility] EMERGENCY DEPT Provider Note   CSN: [id number] Arrival date & time: 02/17/22  1435     History  Chief Complaint  Patient presents with   Shortness of Breath    Julian Oneal is a 56 y.o. male.  Patient is a 56 year old male with a past medical history of hypertension presenting to the emergency department with shortness of breath.  The patient states that he has had an on and off sickness for the last several weeks.  He states he was last treated for a sinus infection about 3 weeks ago however over the last 2 days his symptoms have returned.  He states he has had a lot of congestion as well as a nonproductive cough.  He states he felt feverish at home but was unable to check his temperature.  He denies any nausea, vomiting or diarrhea.  He states that he had coworkers sick with similar symptoms.  He states that he has been using his inhaler that he has from his last pneumonia at home without significant relief.  The history is provided by the patient.  Shortness of Breath      Home Medications Prior to Admission medications   Medication Sig Start Date End Date Taking? Authorizing Provider  benzonatate (TESSALON) 100 MG capsule Take 1 capsule (100 mg total) by mouth every 8 (eight) hours. 02/17/22  Yes Kingsley, [place] K, DO  ALPRAZolam Duanne Moron) 1 MG tablet Take by mouth. 01/26/22   [provider]  cetirizine (ZYRTEC) 10 MG tablet Take 10 mg by mouth daily as needed.     [provider]  CIALIS 5 MG tablet Take 5 mg by mouth daily.  03/16/14   [provider]  fenofibrate 160 MG tablet Take 160 mg by mouth daily.    [provider]  omeprazole (PRILOSEC) 20 MG capsule Take 20 mg by mouth daily.    [provider]  Plecanatide (TRULANCE) 3 MG TABS Take 1 tablet by mouth daily.    [provider]  sodium chloride 0.9 % nebulizer solution SMARTSIG:5 Milliliter(s) Via Inhaler As Directed 08/13/19    [provider]  traZODone (DESYREL) 150 MG tablet TAKE 1 TABLET BY MOUTH EVERYDAY AT BEDTIME 07/03/20   Ward Givens, NP  valsartan (DIOVAN) 160 MG tablet TAKE 1 TABLET BY MOUTH EVERY DAY 07/08/18   Josue Hector, MD  venlafaxine XR (EFFEXOR-XR) 75 MG 24 hr capsule Take 75 mg by mouth daily with breakfast.    [provider]      Allergies    Amitriptyline, Silenor [doxepin hcl], Tribenzor [olmesartan-amlodipine-hctz], and Topiramate    Review of Systems   Review of Systems  Respiratory:  Positive for shortness of breath.     Physical Exam Updated Vital Signs BP 131/83   Pulse 94   Temp (!) 101.1 F (38.4 C) (Oral)   Resp 20   Ht '5\' 10"'$  (1.778 m)   Wt 79.6 kg   SpO2 96%   BMI 25.18 kg/m  Physical Exam Vitals and nursing note reviewed.  Constitutional:      General: He is not in acute distress.    Appearance: He is well-developed.  HENT:     Head: Normocephalic and atraumatic.     Mouth/Throat:     Mouth: Mucous membranes are moist.  Eyes:     Extraocular Movements: Extraocular movements intact.  Cardiovascular:     Rate and Rhythm: Normal rate and regular rhythm.     Pulses: Normal  pulses.     Heart sounds: Normal heart sounds.  Pulmonary:     Effort: Pulmonary effort is normal.     Breath sounds: Normal breath sounds.  Chest:     Chest wall: Tenderness (Mild diffuse) present.  Abdominal:     Palpations: Abdomen is soft.     Tenderness: There is no abdominal tenderness.  Musculoskeletal:        General: Normal range of motion.     Cervical back: Normal range of motion and neck supple.     Right lower leg: No edema.     Left lower leg: No edema.  Skin:    General: Skin is warm and dry.  Neurological:     General: No focal deficit present.     Mental Status: He is alert and oriented to person, place, and time.  Psychiatric:        Mood and Affect: Mood normal.        Behavior: Behavior normal.     ED Results / Procedures / Treatments    Labs (all labs ordered are listed, but only abnormal results are displayed) Labs Reviewed  BASIC METABOLIC PANEL - Abnormal; Notable for the following components:      Result Value   Glucose, Bld 110 (*)    All other components within normal limits  CBC - Abnormal; Notable for the following components:   Hemoglobin 12.9 (*)    HCT 37.6 (*)    All other components within normal limits  RESP PANEL BY RT-PCR (RSV, FLU A&B, COVID)  RVPGX2  GROUP A STREP BY PCR  TROPONIN I (HIGH SENSITIVITY)    EKG EKG Interpretation  Date/Time:  Tuesday February 17 2022 14:43:45 EST Ventricular Rate:  118 PR Interval:  124 QRS Duration: 88 QT Interval:  314 QTC Calculation: 440 R Axis:   84 Text Interpretation: Sinus tachycardia Cannot rule out Anterior infarct , age undetermined Abnormal ECG No significant change since last tracing Confirmed by Leanord Asal ([id number]) on 02/17/2022 5:35:32 PM  Radiology DG Chest 2 View  Result Date: 02/17/2022 CLINICAL DATA:  Chest pain, congestion, cough EXAM: CHEST - 2 VIEW COMPARISON:  02/06/2022 FINDINGS: The heart size and mediastinal contours are within normal limits. Minimal diffuse interstitial opacity. The visualized skeletal structures are unremarkable. IMPRESSION: Minimal diffuse interstitial opacity, suggesting edema or atypical/viral infection. No focal airspace opacity. Electronically Signed   By: Delanna Ahmadi M.D.   On: 02/17/2022 15:35    Procedures Procedures    Medications Ordered in ED Medications  acetaminophen (TYLENOL) tablet 1,000 mg (1,000 mg Oral Given 02/17/22 1455)    ED Course/ Medical Decision Making/ A&P                           Medical Decision Making This patient presents to the ED with chief complaint(s) of fever, cough, SOB with pertinent past medical history of HTN, prior pneumonia which further complicates the presenting complaint. The complaint involves an extensive differential diagnosis and also carries with it a  high risk of complications and morbidity.    The differential diagnosis includes viral syndrome, pneumonia, pulmonary edema, pleural effusion, ACS, arrhythmia, anemia, electrolyte abnormality  Additional history obtained: Additional history obtained from N/A Records reviewed Primary Care Documents  ED Course and Reassessment: Patient was initially evaluated by triage and had EKG, labs and chest x-ray performed.  Labs were within normal range including a negative viral panel and negative troponin.  The  patient's symptoms have been ongoing for 2 days so single troponin is sufficient.  EKGs without acute ischemic changes or signs of arrhythmia.  Chest x-ray showed signs of likely viral illness but no focal consolidations making pneumonia unlikely.  Patient is satting well on room air and is stable for discharge home with continued symptomatic treatment of the viral infection.  He was given strict return precautions.  Independent labs interpretation:  The following labs were independently interpreted: No acute disease  Independent visualization of imaging: - I independently visualized the following imaging with scope of interpretation limited to determining acute life threatening conditions related to emergency care: Chest x-ray, which revealed viral inflammation, no focal consolidations  Consultation: - Consulted or discussed management/test interpretation w/ external professional: N/A  Consideration for admission or further workup: Patient has no emergent conditions requiring admission or further work-up at this time and is stable for discharge home with primary care follow-up  Social Determinants of health: N/A    Amount and/or Complexity of Data Reviewed Labs: ordered. Radiology: ordered.          Final Clinical Impression(s) / ED Diagnoses Final diagnoses:  Viral URI with cough  Fever, unspecified fever cause    Rx / DC Orders ED Discharge Orders          Ordered     benzonatate (TESSALON) 100 MG capsule  Every 8 hours        02/17/22 1817              Kemper Durie, DO 02/17/22 1818

## 2022-02-17 NOTE — ED Triage Notes (Signed)
Patient here POV from Home.  Endorses SOB with other associated symptoms such as Cough and Congestion for 2 Months. Has attempted multiple antibiotics and steroids without relief.   NAD Noted during Triage. A&Ox4. GCS 15. BIB Wheelchair.

## 2022-02-17 NOTE — Discharge Instructions (Addendum)
You were seen in the emergency department for your shortness of breath and your cough.  You tested negative for COVID and flu and your chest x-ray showed no signs of pneumonia but did have some inflammation consistent with a viral infection.  You should continue to take Tylenol and Motrin as needed for fevers or bodyaches.  You can take cough medicine as needed and you can also try raw honey to help with the cough.  You can use your inhaler as needed.  You should stay home from work until you are at least 24-hour fever free without Tylenol or Motrin.  You should follow-up with your primary doctor in the next few days to have your symptoms rechecked.  You should return to the emergency department for worsening shortness of breath, worsening chest pain, vomiting and unable to tolerate any liquids or if you have any other new or concerning symptoms.

## 2022-02-17 NOTE — ED Notes (Signed)
Patient verbalizes understanding of discharge instructions. Opportunity for questioning and answers were provided. Patient discharged from ED.  °

## 2022-02-18 ENCOUNTER — Encounter: Payer: Self-pay | Admitting: Cardiovascular Disease

## 2022-03-25 DIAGNOSIS — J329 Chronic sinusitis, unspecified: Secondary | ICD-10-CM | POA: Insufficient documentation

## 2022-03-25 DIAGNOSIS — J342 Deviated nasal septum: Secondary | ICD-10-CM | POA: Insufficient documentation

## 2022-04-10 DIAGNOSIS — R0981 Nasal congestion: Secondary | ICD-10-CM | POA: Diagnosis not present

## 2022-04-10 DIAGNOSIS — J343 Hypertrophy of nasal turbinates: Secondary | ICD-10-CM | POA: Diagnosis not present

## 2022-04-10 DIAGNOSIS — J329 Chronic sinusitis, unspecified: Secondary | ICD-10-CM | POA: Diagnosis not present

## 2022-04-10 DIAGNOSIS — J3489 Other specified disorders of nose and nasal sinuses: Secondary | ICD-10-CM | POA: Diagnosis not present

## 2022-04-10 DIAGNOSIS — J342 Deviated nasal septum: Secondary | ICD-10-CM | POA: Diagnosis not present

## 2022-04-13 DIAGNOSIS — F411 Generalized anxiety disorder: Secondary | ICD-10-CM | POA: Diagnosis not present

## 2022-04-13 DIAGNOSIS — E782 Mixed hyperlipidemia: Secondary | ICD-10-CM | POA: Diagnosis not present

## 2022-04-13 DIAGNOSIS — R5383 Other fatigue: Secondary | ICD-10-CM | POA: Diagnosis not present

## 2022-04-13 DIAGNOSIS — I10 Essential (primary) hypertension: Secondary | ICD-10-CM | POA: Diagnosis not present

## 2022-04-14 DIAGNOSIS — K219 Gastro-esophageal reflux disease without esophagitis: Secondary | ICD-10-CM | POA: Diagnosis not present

## 2022-04-24 DIAGNOSIS — K2930 Chronic superficial gastritis without bleeding: Secondary | ICD-10-CM | POA: Diagnosis not present

## 2022-04-24 DIAGNOSIS — K3189 Other diseases of stomach and duodenum: Secondary | ICD-10-CM | POA: Diagnosis not present

## 2022-04-24 DIAGNOSIS — K219 Gastro-esophageal reflux disease without esophagitis: Secondary | ICD-10-CM | POA: Diagnosis not present

## 2022-05-04 DIAGNOSIS — J343 Hypertrophy of nasal turbinates: Secondary | ICD-10-CM | POA: Diagnosis not present

## 2022-05-04 DIAGNOSIS — J342 Deviated nasal septum: Secondary | ICD-10-CM | POA: Diagnosis not present

## 2022-05-04 DIAGNOSIS — T485X5A Adverse effect of other anti-common-cold drugs, initial encounter: Secondary | ICD-10-CM | POA: Diagnosis not present

## 2022-05-04 DIAGNOSIS — J310 Chronic rhinitis: Secondary | ICD-10-CM | POA: Insufficient documentation

## 2022-05-04 DIAGNOSIS — J320 Chronic maxillary sinusitis: Secondary | ICD-10-CM | POA: Insufficient documentation

## 2022-05-07 DIAGNOSIS — J209 Acute bronchitis, unspecified: Secondary | ICD-10-CM | POA: Diagnosis not present

## 2022-05-26 ENCOUNTER — Ambulatory Visit: Payer: BC Managed Care – PPO | Admitting: Internal Medicine

## 2022-05-26 ENCOUNTER — Encounter: Payer: Self-pay | Admitting: Internal Medicine

## 2022-05-26 VITALS — BP 136/86 | HR 68 | Temp 98.5°F | Ht 70.0 in | Wt 175.6 lb

## 2022-05-26 DIAGNOSIS — J4540 Moderate persistent asthma, uncomplicated: Secondary | ICD-10-CM

## 2022-05-26 MED ORDER — FLUTICASONE-SALMETEROL 250-50 MCG/ACT IN AEPB: 1.0000 | INHALATION_SPRAY | Freq: Two times a day (BID) | RESPIRATORY_TRACT | 5 refills | Status: DC

## 2022-05-26 MED ORDER — ALBUTEROL SULFATE HFA 108 (90 BASE) MCG/ACT IN AERS: 2.0000 | INHALATION_SPRAY | RESPIRATORY_TRACT | 5 refills | Status: DC | PRN

## 2022-05-26 NOTE — Progress Notes (Signed)
Julian Oneal    HS:[id number]    1965-03-12  Primary Care Physician:Shaw, Nathen May, MD  Referring Physician: Willy Eddy, [place],  [place] [address] Reason for Consultation: shortness of breath Date of Consultation: 05/26/2022  Chief complaint:   Chief Complaint  Patient presents with   Consult    SOB, chest tightness and cough since October 2023.  Only doing well while on prednisone. Advair 250/50 helps sx a lot (started 05/04/2022).     HPI:  Julian Oneal is a 57 y.o. man who presents for new patient evaluation of recurrent bronchitis.Shortness of breath, wheezing, chest tightness, coughing.   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  Has been having annual bronchitis for many years. Has been taking albuterol for a few years Since October 2023 he has had recurrent bronchitis back to back with several courses of prednisone and antibiotics.  Prednisone helps his symptoms each time. He was placed on advair by an urgent care since earlier this month and this has been a "miracle drug." Has helped dramatically. Now taking albuterol once daily, previously was multiple times/day.   No childhood respiratory disease.   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  Exposures: lives at home independently. No pets.  Smoking history: 0.5 ppd x 10 years. 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  Tobacco Use   Smoking status: Former    Packs/day: 0.25    Years: 5.00    Additional pack years: 0.00    Total pack years: 1.25    Types: Cigarettes    Quit date: 03/02/1990    Years since quitting: 32.2   Smokeless tobacco: Never  Vaping Use   Vaping Use: Never used  Substance and Sexual Activity   Alcohol use: Not Currently    Comment: occasionally   Drug  use: No   Sexual activity: Not on file    Relevant family history:  Family History  Problem Relation Age of Onset   Prostate cancer Father    Cancer - Prostate Father    Allergies Sister    Heart disease Maternal Grandmother    Cancer - Prostate Maternal Grandfather    Prostate cancer Maternal Grandfather    COPD Maternal Grandfather    Heart disease Paternal Grandmother    Parkinson's disease Maternal Uncle    Alzheimer's disease Paternal Aunt        in their late 36's   Alzheimer's disease Paternal Uncle        in their late 26's    Past Medical History:  Diagnosis Date   Bell's palsy 2008   Dr Jannifer Franklin   Dyspnea 08/28/2014   Followed in Pulmonary clinic/ [facility]/ [facility]  - 08/28/2014  Walked RA x 3 laps @ 185 ft each stopped due to end of study, fast pace, no doe or desats    - trial off acei 08/28/2014 >> improved 10/15/2014  - PFT's  10/21/2014  FEV1 3.40 (87 % ) ratio 88  p 6 % improvement from saba with DLCO  83 % corrects to 124 % for alv volume      Excessive daytime sleepiness 04/04/2015   Headache(784.0)    Headaches, cluster 06/20/2013   High cholesterol  Hypertension    Insomnia    Obstructive sleep apnea 07/05/2012   Patient diagnosed with OSA and using CPAP at 6 cm water , with residual AHI of 0.8 on 07-04-12 . [facility] is his  DME .     Persistent headaches 07/05/2012    Referred to Headaches and wellness center Dr. Domingo Cocking in 2013 -     Pneumonia, viral 06/20/2013   Taste absent     Past Surgical History:  Procedure Laterality Date   CHOLECYSTECTOMY  2003   FOOT SURGERY  2008   HERNIA REPAIR  [id number]   MOUTH SURGERY       Physical Exam: Blood pressure 136/86, pulse 68, temperature 98.5 F (36.9 C), temperature source Oral, height 5\' 10"  (1.778 m), weight 175 lb 9.6 oz (79.7 kg), SpO2 97 %. Gen:      No acute distress ENT:  no nasal polyps, mucus membranes moist Lungs:    No increased respiratory effort, symmetric chest wall excursion, clear to  auscultation bilaterally, no wheezes or crackles CV:         Regular rate and rhythm; no murmurs, rubs, or gallops.  No pedal edema Abd:      + bowel sounds; soft, non-tender; no distension MSK: no acute synovitis of DIP or PIP joints, no mechanics hands.  Skin:      Warm and dry; no rashes Neuro: normal speech, no focal facial asymmetry Psych: alert and oriented x3, normal mood and affect   Data Reviewed/Medical Decision Making:  Independent interpretation of tests: Imaging:  Review of patient's chest xray images revealed december 2023 no acute process. The patient's images have been independently reviewed by me.    PFTs: I have personally reviewed the patient's PFTs and normal PFT in 2016    Latest Ref Rng & Units 10/15/2014    9:55 AM  PFT Results  FVC-Pre L 3.83   FVC-Predicted Pre % 76   FVC-Post L 3.88   FVC-Predicted Post % 77   Pre FEV1/FVC % % 82   Post FEV1/FCV % % 88   FEV1-Pre L 3.12   FEV1-Predicted Pre % 80   FEV1-Post L 3.40   DLCO uncorrected ml/min/mmHg 26.46   DLCO UNC% % 83   DLVA Predicted % 124   TLC L 5.01   TLC % Predicted % 73   RV % Predicted % 72     Labs:  Lab Results  Component Value Date   NA 137 02/17/2022   K 3.9 02/17/2022   CO2 25 02/17/2022   GLUCOSE 110 (H) 02/17/2022   BUN 8 02/17/2022   CREATININE 0.94 02/17/2022   CALCIUM 9.0 02/17/2022   GFRNONAA >60 02/17/2022   Lab Results  Component Value Date   WBC 5.0 02/17/2022   HGB 12.9 (L) 02/17/2022   HCT 37.6 (L) 02/17/2022   MCV 87.9 02/17/2022   PLT 172 02/17/2022     Immunization status:  Immunization History  Administered Date(s) Administered   Influenza Split 11/30/2013   Influenza-Unspecified 10/31/2021   Tdap 03/24/2014     I reviewed prior external note(s) from urgent care/ed  I reviewed the result(s) of the labs and imaging as noted above.   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 Continue albuterol as needed Will obtain PFTs to assess for disease severity. FeNO at next  visit if not well controlled.   We discussed disease management and progression at length today regarding asthma.    Return to Care: Return in about 3 months (around 08/26/2022).  Lenice Llamas, MD Pulmonary and [place]  CC: Willy Eddy, MD

## 2022-05-26 NOTE — Patient Instructions (Addendum)
Please schedule follow up scheduled with myself in 3 months.  If my schedule is not open yet, we will contact you with a reminder closer to that time. Please call [phone] if you haven't heard from [place] a month before.   Before your next visit I would like you to have: Full set of PFTs - 45 minutes. Come early before next appt for this.   Continue advair 1 puff twice a day. Gargle after use. Continue albuterol inhaler as needed.   By learning about asthma and how it can be controlled, you take an important step toward managing this disease. Work closely with your asthma care team to learn all you can about your asthma, how to avoid triggers, what your medications do, and how to take them correctly. With proper care, you can live free of asthma symptoms and maintain a normal, healthy lifestyle.   What is asthma? Asthma is a chronic disease that affects the airways of the lungs. During normal breathing, the bands of muscle that surround the airways are relaxed and air moves freely. During an asthma episode or "attack," there are three main changes that stop air from moving easily through the airways: The bands of muscle that surround the airways tighten and make the airways narrow. This tightening is called bronchospasm.  The lining of the airways becomes swollen or inflamed.  The cells that line the airways produce more mucus, which is thicker than normal and clogs the airways.  These three factors - bronchospasm, inflammation, and mucus production - cause symptoms such as difficulty breathing, wheezing, and coughing.  What are the most common symptoms of asthma? Asthma symptoms are not the same for everyone. They can even change from episode to episode in the same person. Also, you may have only one symptom of asthma, such as cough, but another person may have all the symptoms of asthma. It is important to know all the symptoms of asthma and to be aware that your asthma can present in any of  these ways at any time. The most common symptoms include: Coughing, especially at night  Shortness of breath  Wheezing  Chest tightness, pain, or pressure   Who is affected by asthma? Asthma affects 22 million Americans; about 6 million of these are children under age 12. People who have a family history of asthma have an increased risk of developing the disease. Asthma is also more common in people who have allergies or who are exposed to tobacco smoke. However, anyone can develop asthma at any time. Some people may have asthma all of their lives, while others may develop it as adults.  What causes asthma? The airways in a person with asthma are very sensitive and react to many things, or "triggers." Contact with these triggers causes asthma symptoms. One of the most important parts of asthma control is to identify your triggers and then avoid them when possible. The only trigger you do not want to avoid is exercise. Pre-treatment with medicines before exercise can allow you to stay active yet avoid asthma symptoms. Common asthma triggers include: Infections (colds, viruses, flu, sinus infections)  Exercise  Weather (changes in temperature and/or humidity, cold air)  Tobacco smoke  Allergens (dust mites, pollens, pets, mold spores, cockroaches, and sometimes foods)  Irritants (strong odors from cleaning products, perfume, wood smoke, air pollution)  Strong emotions such as crying or laughing hard  Some medications   How is asthma diagnosed? To diagnose asthma, your doctor will first review  your medical history, family history, and symptoms. Your doctor will want to know any past history of breathing problems you may have had, as well as a family history of asthma, allergies, eczema (a bumpy, itchy skin rash caused by allergies), or other lung disease. It is important that you describe your symptoms in detail (cough, wheeze, shortness of breath, chest tightness), including when and how often  they occur. The doctor will perform a physical examination and listen to your heart and lungs. He or she may also order breathing tests, allergy tests, blood tests, and chest and sinus X-rays. The tests will find out if you do have asthma and if there are any other conditions that are contributing factors.  How is asthma treated? Asthma can be controlled, but not cured. It is not normal to have frequent symptoms, trouble sleeping, or trouble completing tasks. Appropriate asthma care will prevent symptoms and visits to the emergency room and hospital. Asthma medicines are one of the mainstays of asthma treatment. The drugs used to treat asthma are explained below.  Anti-inflammatories: These are the most important drugs for most people with asthma. Anti-inflammatory drugs reduce swelling and mucus production in the airways. As a result, airways are less sensitive and less likely to react to triggers. These medications need to be taken daily and may need to be taken for several weeks before they begin to control asthma. Anti-inflammatory medicines lead to fewer symptoms, better airflow, less sensitive airways, less airway damage, and fewer asthma attacks. If taken every day, they CONTROL or prevent asthma symptoms.   Bronchodilators: These drugs relax the muscle bands that tighten around the airways. This action opens the airways, letting more air in and out of the lungs and improving breathing. Bronchodilators also help clear mucus from the lungs. As the airways open, the mucus moves more freely and can be coughed out more easily. In short-acting forms, bronchodilators RELIEVE or stop asthma symptoms by quickly opening the airways and are very helpful during an asthma episode. In long-acting forms, bronchodilators provide CONTROL of asthma symptoms and prevent asthma episodes.  Asthma drugs can be taken in a variety of ways. Inhaling the medications by using a metered dose inhaler, dry powder inhaler, or  nebulizer is one way of taking asthma medicines. Oral medicines (pills or liquids you swallow) may also be prescribed.  Asthma severity Asthma is classified as either "intermittent" (comes and goes) or "persistent" (lasting). Persistent asthma is further described as being mild, moderate, or severe. The severity of asthma is based on how often you have symptoms both during the day and night, as well as by the results of lung function tests and by how well you can perform activities. The "severity" of asthma refers to how "intense" or "strong" your asthma is.  Asthma control Asthma control is the goal of asthma treatment. Regardless of your asthma severity, it may or may not be controlled. Asthma control means: You are able to do everything you want to do at work and home  You have no (or minimal) asthma symptoms  You do not wake up from your sleep or earlier than usual in the morning due to asthma  You rarely need to use your reliever medicine (inhaler)  Another major part of your treatment is that you are happy with your asthma care and believe your asthma is controlled.  Monitoring symptoms A key part of treatment is keeping track of how well your lungs are working. Monitoring your symptoms, what they are,  how and when they happen, and how severe they are, is an important part of being able to control your asthma.  Sometimes asthma is monitored using a peak flow meter. A peak flow (PF) meter measures how fast the air comes out of your lungs. It can help you know when your asthma is getting worse, sometimes even before you have symptoms. By taking daily peak flow readings, you can learn when to adjust medications to keep asthma under good control. It is also used to create your asthma action plan (see below). Your doctor can use your peak flow readings to adjust your treatment plan in some cases.  Asthma Action Plan Based on your history and asthma severity, you and your doctor will develop a care  plan called an "asthma action plan." The asthma action plan describes when and how to use your medicines, actions to take when asthma worsens, and when to seek emergency care. Make sure you understand this plan. If you do not, ask your asthma care provider any questions you may have. Your asthma action plan is one of the keys to controlling asthma. Keep it readily available to remind you of what you need to do every day to control asthma and what you need to do when symptoms occur.  Goals of asthma therapy These are the goals of asthma treatment: Live an active, normal life  Prevent chronic and troublesome symptoms  Attend work or school every day  Perform daily activities without difficulty  Stop urgent visits to the doctor, emergency department, or hospital  Use and adjust medications to control asthma with few or no side effects

## 2022-06-08 ENCOUNTER — Ambulatory Visit: Payer: BC Managed Care – PPO | Admitting: Family Medicine

## 2022-06-08 ENCOUNTER — Encounter: Payer: Self-pay | Admitting: Family Medicine

## 2022-06-08 ENCOUNTER — Ambulatory Visit (INDEPENDENT_AMBULATORY_CARE_PROVIDER_SITE_OTHER): Payer: BC Managed Care – PPO

## 2022-06-08 ENCOUNTER — Other Ambulatory Visit: Payer: Self-pay

## 2022-06-08 VITALS — BP 138/88 | HR 93 | Ht 70.0 in | Wt 174.6 lb

## 2022-06-08 DIAGNOSIS — M25561 Pain in right knee: Secondary | ICD-10-CM | POA: Diagnosis not present

## 2022-06-08 DIAGNOSIS — M1711 Unilateral primary osteoarthritis, right knee: Secondary | ICD-10-CM | POA: Diagnosis not present

## 2022-06-08 DIAGNOSIS — M7989 Other specified soft tissue disorders: Secondary | ICD-10-CM | POA: Diagnosis not present

## 2022-06-08 MED ORDER — DICLOFENAC SODIUM 75 MG PO TBEC: 75.0000 mg | DELAYED_RELEASE_TABLET | Freq: Two times a day (BID) | ORAL | 0 refills | Status: DC | PRN

## 2022-06-08 NOTE — Progress Notes (Signed)
I, Stevenson Clinch, CMA acting as a Neurosurgeon for Julian Graham, MD.  Subjective:    CC: R knee pain  HPI: Pt is a 57 y/o male c/o R knee pain x 1 month, MOI unknown. 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 Pt locates pain to medial and lateral aspects. Sx worse with twisting, stairs, pushing the gas. Tightness at posterior aspect. Pain radiating into the posterior thigh. The knee will catch, has caused him to fall a few times. No recent imaging of the knee.   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   Pertinent review of Systems: No fevers or chills  Relevant historical information: Hypertension.  Asthma.   Objective:    Vitals:   06/08/22 1236  BP: 138/88  Pulse: 93  SpO2: 96%   General: Well Developed, well nourished, and in no acute distress.   MSK: Right knee: Mild effusion with some swelling anterior knee.  Otherwise normal. Decreased range of motion.  Lacks full extension and full flexion. Strength 4/5 to extension with protein.  5/5 flexion. Stable ligamentous exam.  Lab and Radiology Results  Procedure: Real-time Ultrasound Guided Injection of right knee joint superior lateral patellar space Device: Philips Affiniti 50G Images permanently stored and available for review in PACS Ultrasound evaluation prior to injection shows intact quad and patellar tendons. Mild joint effusion superior patellar space present. Hypoechoic fluid tracks within subcutaneous tissue superficial to the patellar tendon consistent with prepatellar bursitis. Mild lateral compartment degeneration. Small Baker's cyst. Verbal informed consent obtained.  Discussed risks and benefits of procedure. Warned about infection, bleeding, hyperglycemia damage to structures among others. Patient expresses understanding and  agreement Time-out conducted.   Noted no overlying erythema, induration, or other signs of local infection.   Skin prepped in a sterile fashion.   Local anesthesia: Topical Ethyl chloride.   With sterile technique and under real time ultrasound guidance: 40 mg of Kenalog and 2 mL of Marcaine injected into knee joint. Fluid seen entering the joint capsule.   Completed without difficulty   Pain moderately  resolved suggesting accurate placement of the medication.   Advised to call if fevers/chills, erythema, induration, drainage, or persistent bleeding.   Images permanently stored and available for review in the ultrasound unit.  Impression: Technically successful ultrasound guided injection.   X-ray images right knee obtained today personally and independently interpreted Mild medial compartment DJD.  Soft tissue swelling superficial to the patella is present. Await formal radiology review   Impression and Recommendations:    Assessment and Plan: 57 y.o. male with right knee pain.  Pain thought to be multifactorial.  He does have evidence of prepatellar bursitis and a small effusion.  Plan for intra-articular injection and will treat with compression and Voltaren gel.  Also use oral diclofenac.  Check back in 1 month.  Consider formal physical therapy.Marland Kitchen  PDMP not reviewed this encounter. Orders Placed This Encounter  Procedures   [place] LIMITED JOINT SPACE STRUCTURES LOW RIGHT(NO LINKED CHARGES)    Order Specific Question:   Reason for Exam (SYMPTOM  OR DIAGNOSIS REQUIRED)    Answer:   right knee pain    Order Specific Question:   Preferred imaging location?    Answer:   [facility] Knee AP/LAT W/Sunrise Right    Standing Status:   Future  Number of Occurrences:   1    Standing Expiration Date:   07/08/2022    Order Specific Question:   Reason for Exam (SYMPTOM  OR DIAGNOSIS REQUIRED)    Answer:   RIGHT KNEE PAIN    Order Specific Question:   Preferred  imaging location?    Answer:   [facility]   Meds ordered this encounter  Medications   diclofenac (VOLTAREN) 75 MG EC tablet    Sig: Take 1 tablet (75 mg total) by mouth 2 (two) times daily as needed.    Dispense:  60 tablet    Refill:  0    Discussed warning signs or symptoms. Please see discharge instructions. Patient expresses understanding.   The above documentation has been reviewed and is accurate and complete Julian Oneal, M.D.

## 2022-06-08 NOTE — Patient Instructions (Addendum)
Thank you for coming in today.   Please get an Xray today before you leave   Please use Voltaren gel (Generic Diclofenac Gel) up to 4x daily for pain as needed.  This is available over-the-counter as both the name brand Voltaren gel and the generic diclofenac gel.   You received an injection today. Seek immediate medical attention if the joint becomes red, extremely painful, or is oozing fluid.   I recommend you obtained a compression sleeve to help with your joint problems. There are many options on the market however I recommend obtaining a FULL KNEE Body Helix compression sleeve.  You can find information (including how to appropriate measure yourself for sizing) can be found at www.[email or web address].ch.  Many of these products are health savings account (HSA) eligible.  You can use the compression sleeve at any time throughout the day but is most important to use while being active as well as for 2 hours post-activity.   It is appropriate to ice following activity with the compression sleeve in place.   Check back in 1 month

## 2022-06-09 NOTE — Progress Notes (Signed)
Right knee x-ray shows swelling overlying the front of the knee joint and a little bit of arthritis.

## 2022-06-15 ENCOUNTER — Other Ambulatory Visit: Payer: Self-pay

## 2022-06-15 ENCOUNTER — Ambulatory Visit: Payer: BC Managed Care – PPO | Admitting: Family Medicine

## 2022-06-15 ENCOUNTER — Encounter: Payer: Self-pay | Admitting: Family Medicine

## 2022-06-15 VITALS — BP 106/72 | HR 88 | Ht 70.0 in | Wt 172.4 lb

## 2022-06-15 DIAGNOSIS — M25561 Pain in right knee: Secondary | ICD-10-CM | POA: Diagnosis not present

## 2022-06-15 MED ORDER — HYDROCODONE-ACETAMINOPHEN 5-325 MG PO TABS: 1.0000 | ORAL_TABLET | Freq: Four times a day (QID) | ORAL | 0 refills | Status: DC | PRN

## 2022-06-15 NOTE — Progress Notes (Signed)
   I, Julian Oneal, CMA acting as a scribe for Julian Graham, MD.  Julian Oneal is a 57 y.o. male who presents to [facility] at [facility] today for worsening right knee pain. 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 Patient was last seen by Dr. Denyse Amass on 06/08/2022 and was given a right knee steroid injection advised to treat with compression, Voltaren gel, and oral diclofenac.  Today, patient reports taking dog to the vet the day following injection, had dog on a short leash and dog pulled him around, the knee has been worse since then. Sx causing night disturbance, pain with WB and ambulation. Having pain at posterior aspect of the knee, joint line,and radiating into the back of the thigh.   Dx imaging: 06/08/2022 R knee XR  Pertinent review of systems: No fevers or chills  Relevant historical information: Hypertension.   Exam:  BP 106/72   Pulse 88   Ht 5\' 10"  (1.778 m)   Wt 172 lb 6.4 oz (78.2 kg)   SpO2 95%   BMI 24.74 kg/m  General: Well Developed, well nourished, and in no acute distress.   MSK: Right knee mild effusion otherwise normal. Tender palpation diffusely but worse at the medial joint line. Stable ligamentous exam pain with MCL stress test no laxity felt. This test is affected by guarding however. Positive medial McMurray's test. Intact strength. Limited range of motion inability to fully flex the knee beyond 90 degrees.  Significant antalgic gait    Assessment and Plan: 57 y.o. male with knee pain.  Etiology of current pain episode is little unclear.  He seen for right knee pain on April 8 thought to be prepatellar bursitis.  He had a steroid injection and was feeling better.  However the next day he had an acute injury that is concerning for ACL or meniscus tear.  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  At this point he is having significant pain and significant lack of ability to  move his knee normally and walk normally.  This is affecting his job.  Plan for MRI of the knee to evaluate source of pain and for potential surgical planning. Hydrocodone prescribed for limited pain control in addition to the already existing diclofenac pills.   PDMP reviewed during this encounter. Orders Placed This Encounter  Procedures   MR Knee Right Wo Contrast    Standing Status:   Future    Standing Expiration Date:   06/15/2023    Order Specific Question:   What is the patient's sedation requirement?    Answer:   No Sedation    Order Specific Question:   Does the patient have a pacemaker or implanted devices?    Answer:   No    Order Specific Question:   Preferred imaging location?    Answer:   [facility] (table limit-350lbs)   Meds ordered this encounter  Medications   HYDROcodone-acetaminophen (NORCO/VICODIN) 5-325 MG tablet    Sig: Take 1 tablet by mouth every 6 (six) hours as needed.    Dispense:  15 tablet    Refill:  0     Discussed warning signs or symptoms. Please see discharge instructions. Patient expresses understanding.   The above documentation has been reviewed and is accurate and complete Julian Oneal, M.D.

## 2022-06-15 NOTE — Patient Instructions (Addendum)
Thank you for coming in today.   You should hear from MRI scheduling within 1 week. If you do not hear please let me know.    Let me know if you have trouble scheduling the MRI.   OK to use the hydrocodone with the oral diclofenac.

## 2022-06-22 ENCOUNTER — Ambulatory Visit (INDEPENDENT_AMBULATORY_CARE_PROVIDER_SITE_OTHER): Payer: BC Managed Care – PPO

## 2022-06-22 DIAGNOSIS — M25561 Pain in right knee: Secondary | ICD-10-CM | POA: Diagnosis not present

## 2022-06-26 NOTE — Progress Notes (Signed)
There is significant bone bruising in the lateral part of the knee (outside part of the knee) that is thought to be a possible fracture.  This is very likely the source of pain.  Taking the weight off of your knee is going to be very important here.  Recommend reduce weightbearing or nonweightbearing using crutches or cane.  Recommend return to clinic in the near future to talk about the results in full detail and discuss other treatment options.

## 2022-06-29 ENCOUNTER — Ambulatory Visit: Payer: BC Managed Care – PPO | Admitting: Family Medicine

## 2022-06-29 VITALS — BP 120/86 | HR 86 | Ht 70.0 in | Wt 172.0 lb

## 2022-06-29 DIAGNOSIS — M1711 Unilateral primary osteoarthritis, right knee: Secondary | ICD-10-CM

## 2022-06-29 DIAGNOSIS — M25561 Pain in right knee: Secondary | ICD-10-CM | POA: Diagnosis not present

## 2022-06-29 MED ORDER — OXYCODONE-ACETAMINOPHEN 5-325 MG PO TABS: 1.0000 | ORAL_TABLET | Freq: Three times a day (TID) | ORAL | 0 refills | Status: DC | PRN

## 2022-06-29 NOTE — Progress Notes (Signed)
Julian Oneal is a 57 y.o. male who presents to [facility] at [facility] today for f/u right knee pain and MRI review.  Patient was last seen by Dr. Denyse Amass on 06/15/2022 and was advised to proceed to MRI, prescribed hydrocodone and advised to use his already existing prescription of his oral diclofenac.  Based on MRI results, patient was advised to reduce weightbearing. Today, patient reports still feels the same.  Notes significant pain and pain with weightbearing.  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  Dx imaging: 06/22/2022 R knee MRI 06/08/2022 R knee XR   Pertinent review of systems: No fevers or chills  Relevant historical information: Hypertension.  Sleep apnea.  Primary caregiver disabled mother.   Exam:  BP 120/86   Pulse 86   Ht 5\' 10"  (1.778 m)   Wt 172 lb (78 kg)   SpO2 95%   BMI 24.68 kg/m  General: Well Developed, well nourished, and in no acute distress.   MSK: Right knee tender palpation lateral joint line.  Decreased range of motion antalgic gait. Some laxity to LCL.   Lab and Radiology Results  MRI OF THE RIGHT KNEE WITHOUT CONTRAST   TECHNIQUE: Multiplanar, multisequence MR imaging of the knee was performed. No intravenous contrast was administered.   COMPARISON:  None Available.   FINDINGS: MENISCI   Medial: Intact.   Lateral: Intact.   LIGAMENTS   Cruciates: ACL and PCL are intact.   Collaterals: Medial collateral ligament is intact. Lateral collateral ligament complex is intact.   CARTILAGE   Patellofemoral:  No chondral defect.   Medial: Mild cartilage fissuring of the lateral aspect of the weight-bearing surface of the medial femoral condyle.   Lateral:  No chondral defect.   JOINT: No joint effusion. Normal Hoffa's fat-pad. No plical thickening. Mild edema in the anterior suprapatellar fat pad.   POPLITEAL FOSSA: Popliteus tendon is intact. No Baker's cyst.   EXTENSOR MECHANISM: Intact  quadriceps tendon. Intact patellar tendon. Intact lateral patellar retinaculum. Intact medial patellar retinaculum. Intact MPFL.   BONES: No aggressive osseous lesion. Subchondral serpiginous signal abnormality in the posterior nonweightbearing surface of the lateral femoral condyle with severe surrounding bone marrow edema concerning for nondisplaced, nondepressed subchondral insufficiency fracture and possible developing osteonecrosis.   Other: No fluid collection or hematoma. Muscles are normal.   IMPRESSION: 1. Subchondral serpiginous signal abnormality in the posterior nonweightbearing surface of the lateral femoral condyle with severe surrounding bone marrow edema concerning for a nondisplaced, nondepressed subchondral insufficiency fracture and possible developing osteonecrosis. 2. No meniscal or ligamentous injury of the right knee.     Electronically Signed   By: Elige Ko M.D.   On: 06/26/2022 05:42 I, Clementeen Graham, personally (independently) visualized and performed the interpretation of the images attached in this note.     Assessment and Plan: 57 y.o. male with right knee pain thought to be due to lateral femoral condyle bone edema with possible fracture.  This was well-seen on MRI and does explain his pain.  Recommend offloading using crutches or walker or cane.  Very little or no weightbearing would be ideal but probably is not feasible with his current life.  Additionally a fair amount of the component of his pain is due to DJD.  DJD with the instability seen on exam would be significantly improved by a lateral off loader knee brace.  I contacted [employer] to try to get that arranged.  Oxycodone prescribed for severe  pain.  PDMP reviewed during this encounter. No orders of the defined types were placed in this encounter.  Meds ordered this encounter  Medications   oxyCODONE-acetaminophen (PERCOCET/ROXICET) 5-325 MG tablet    Sig: Take 1 tablet by mouth every 8  (eight) hours as needed for severe pain.    Dispense:  15 tablet    Refill:  0     Discussed warning signs or symptoms. Please see discharge instructions. Patient expresses understanding.   The above documentation has been reviewed and is accurate and complete Clementeen Graham, M.D.  Total encounter time 30 minutes including face-to-face time with the patient and, reviewing past medical record, and charting on the date of service.

## 2022-06-29 NOTE — Patient Instructions (Signed)
Thank you for coming in today.   Use a caine or crutches or a walker to take as much pressure off that knee as you can.   I will try to get a special knee brace.

## 2022-07-07 DIAGNOSIS — M1711 Unilateral primary osteoarthritis, right knee: Secondary | ICD-10-CM | POA: Diagnosis not present

## 2022-07-08 ENCOUNTER — Ambulatory Visit: Payer: BC Managed Care – PPO | Admitting: Family Medicine

## 2022-07-13 ENCOUNTER — Ambulatory Visit: Payer: Self-pay | Admitting: Allergy

## 2022-08-26 ENCOUNTER — Encounter: Payer: Self-pay | Admitting: Internal Medicine

## 2022-08-26 ENCOUNTER — Ambulatory Visit: Payer: BC Managed Care – PPO | Admitting: Internal Medicine

## 2022-08-26 VITALS — BP 120/70 | HR 89 | Temp 97.7°F | Ht 70.0 in | Wt 175.2 lb

## 2022-08-26 DIAGNOSIS — J4550 Severe persistent asthma, uncomplicated: Secondary | ICD-10-CM

## 2022-08-26 DIAGNOSIS — K219 Gastro-esophageal reflux disease without esophagitis: Secondary | ICD-10-CM | POA: Diagnosis not present

## 2022-08-26 DIAGNOSIS — J4540 Moderate persistent asthma, uncomplicated: Secondary | ICD-10-CM | POA: Diagnosis not present

## 2022-08-26 DIAGNOSIS — J301 Allergic rhinitis due to pollen: Secondary | ICD-10-CM

## 2022-08-26 LAB — CBC WITH DIFFERENTIAL/PLATELET
Basophils Absolute: 0 10*3/uL (ref 0.0–0.1)
Basophils Relative: 0.5 % (ref 0.0–3.0)
Eosinophils Absolute: 0.2 10*3/uL (ref 0.0–0.7)
Eosinophils Relative: 2.9 % (ref 0.0–5.0)
HCT: 41.4 % (ref 39.0–52.0)
Hemoglobin: 14.1 g/dL (ref 13.0–17.0)
Lymphocytes Relative: 30.3 % (ref 12.0–46.0)
Lymphs Abs: 1.6 10*3/uL (ref 0.7–4.0)
MCHC: 34 g/dL (ref 30.0–36.0)
MCV: 88.6 fl (ref 78.0–100.0)
Monocytes Absolute: 0.5 10*3/uL (ref 0.1–1.0)
Monocytes Relative: 8.8 % (ref 3.0–12.0)
Neutro Abs: 3.1 10*3/uL (ref 1.4–7.7)
Neutrophils Relative %: 57.5 % (ref 43.0–77.0)
Platelets: 175 10*3/uL (ref 150.0–400.0)
RBC: 4.68 Mil/uL (ref 4.22–5.81)
RDW: 12.8 % (ref 11.5–15.5)
WBC: 5.4 10*3/uL (ref 4.0–10.5)

## 2022-08-26 LAB — POCT EXHALED NITRIC OXIDE: FeNO level (ppb): 15

## 2022-08-26 MED ORDER — FLUTICASONE-SALMETEROL 500-50 MCG/ACT IN AEPB: 1.0000 | INHALATION_SPRAY | Freq: Two times a day (BID) | RESPIRATORY_TRACT | 5 refills | Status: DC

## 2022-08-26 MED ORDER — MONTELUKAST SODIUM 10 MG PO TABS: 10.0000 mg | ORAL_TABLET | Freq: Every day | ORAL | 11 refills | Status: DC

## 2022-08-26 NOTE — Patient Instructions (Signed)
Full PFT performed today. °

## 2022-08-26 NOTE — Patient Instructions (Addendum)
Please schedule follow up scheduled with myself in 3 months.  If my schedule is not open yet, we will contact you with a reminder closer to that time. Please call [phone] if you haven't heard from [place] a month before.   Breathing testing today was normal which is consistent with asthma diagnosis.   Before your next visit I would like you to have: Increase your advair to 500 1 puff twice daily.   Please get blood work today for your allergies. Gargle after use.   Continue the albuterol as needed.   Continue astelin nasal spray and cetirizine. I am adding a pill called montelukast to help with allergies too.

## 2022-08-26 NOTE — Progress Notes (Signed)
Full PFT performed today. °

## 2022-08-26 NOTE — Progress Notes (Deleted)
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   Interval Updates: Here for follow up after PFTs.   I have reviewed the patient's family social and past medical history and updated as appropriate.   Past Medical History:  Diagnosis Date   Bell's palsy 2008   Dr Anne Hahn   Dyspnea 08/28/2014   Followed in [facility] clinic[facility]  - 08/28/2014  Walked RA x 3 laps @ 185 ft each stopped due to end of study, fast pace, no doe or desats    - trial off acei 08/28/2014 >> improved 10/15/2014  - PFT's  10/21/2014  FEV1 3.40 (87 % ) ratio 88  p 6 % improvement from saba with DLCO  83 % corrects to 124 % for alv volume      Excessive daytime sleepiness 04/04/2015   Headache(784.0)    Headaches, cluster 06/20/2013   High cholesterol    Hypertension    Insomnia    Obstructive sleep apnea 07/05/2012   Patient diagnosed with OSA and using CPAP at 6 cm water , with residual AHI of 0.8 on 07-04-12 . [facility] is his  DME .     Persistent headaches 07/05/2012    Referred [facility] Dr. Neale Burly in 2013 -     Pneumonia, viral 06/20/2013   Taste absent     Past Surgical History:  Procedure Laterality Date   CHOLECYSTECTOMY  2003   FOOT SURGERY  2008   HERNIA REPAIR  [id number]   MOUTH SURGERY      Family History  Problem Relation Age of Onset   Prostate cancer Father    Cancer - Prostate Father    Allergies Sister    Heart disease Maternal Grandmother    Cancer - Prostate Maternal Grandfather    Prostate cancer Maternal Grandfather    COPD Maternal Grandfather    Heart disease Paternal Grandmother    Parkinson's disease Maternal Uncle     Alzheimer's disease Paternal Aunt        in their late 28's   Alzheimer's disease Paternal Uncle        in their late 21's    Social History   Occupational History   Occupation: fireman    Comment: Neurosurgeon: [employer]  Tobacco Use   Smoking status: Former    Packs/day: 0.25    Years: 5.00    Additional pack years: 0.00    Total pack years: 1.25    Types: Cigarettes    Quit date: 03/02/1990    Years since quitting: 32.5   Smokeless tobacco: Never  Vaping Use   Vaping Use: Never used  Substance and Sexual Activity   Alcohol use: Not Currently    Comment: occasionally   Drug use: No   Sexual activity: Not on file     Physical Exam: Blood pressure 120/70, pulse 89, temperature 97.7 F (36.5 C), temperature source Oral, height 5\' 10"  (1.778 m), weight 175 lb 3.2 oz (79.5 kg), SpO2 97 %.  Gen:      No  acute distress ENT:  ***no nasal polyps, mucus membranes moist Lungs:    No increased respiratory effort, symmetric chest wall excursion, clear to auscultation bilaterally, ***no wheezes or crackles CV:         Regular rate and rhythm; no murmurs, rubs, or gallops.  No pedal edema   Data Reviewed: Imaging: I have personally reviewed the ***  PFTs:     Latest Ref Rng & Units 10/15/2014    9:55 AM  PFT Results  FVC-Pre L 3.83   FVC-Predicted Pre % 76   FVC-Post L 3.88   FVC-Predicted Post % 77   Pre FEV1/FVC % % 82   Post FEV1/FCV % % 88   FEV1-Pre L 3.12   FEV1-Predicted Pre % 80   FEV1-Post L 3.40   DLCO uncorrected ml/min/mmHg 26.46   DLCO UNC% % 83   DLVA Predicted % 124   TLC L 5.01   TLC % Predicted % 73   RV % Predicted % 72    I have personally reviewed the patient's PFTs and ***  Labs:  Immunization status: Immunization History  Administered Date(s) Administered   Influenza Inj Mdck Quad Pf 12/30/2016, 11/30/2017   Influenza Split 11/30/2013   Influenza-Unspecified 12/01/2018, 10/31/2021    Tdap 03/24/2014    External Records Personally Reviewed: ***  Assessment:  Moderate persistent asthma, new diagnosis, 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 78469 10-19 minutes or Straightforward MDM 62952 20-29 minutes or Low level MDM 84132 30-39 minutes or Moderate level MDM 44010  40-54 minutes or High level MDM    Return to Care: No follow-ups on file.   Durel Salts, MD Pulmonary and Critical Care Medicine [facility] Office:[phone]

## 2022-08-26 NOTE — Progress Notes (Signed)
[name] Figley    [id number]    04/13/1965  Primary Care Physician:Shaw, Rockney Ghee, MD Date of Appointment: 08/26/2022 Established Patient Visit  Chief complaint:   Chief Complaint  Patient presents with   Follow-up    Chest tightness with exertion, using albuterol frequently      HPI: Julian Oneal is a 57 y.o. 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 However still not back to normal and Using albuterol up to 8 times a day. Usually triggered by seasonal changes or being outside  Current Regimen:advair 250 1 puff twice daily Asthma Triggers: URI, seasonal environmental allergies Exacerbations in the last year: 6-7 times/last 12 months.  History of hospitalization or intubation: never intubated Allergy Testing: never had GERD: yes on PPI Allergic Rhinitis: zyrtec ACT:  Asthma Control Test ACT Total Score  05/26/2022  2:20 PM 9   FeNO: 15 ppb  I have reviewed the patient's family social and past medical history and updated as appropriate.   Past Medical History:  Diagnosis Date   Bell's palsy 2008   Dr Anne Hahn   Dyspnea 08/28/2014   Followed in Pulmonary clinic/ [facility]/ [facility]  - 08/28/2014  Walked RA x 3 laps @ 185 ft each stopped due to end of study, fast pace, no doe or desats    - trial off acei 08/28/2014 >> improved 10/15/2014  - PFT's  10/21/2014  FEV1 3.40 (87 % ) ratio 88  p 6 % improvement from saba with DLCO  83 % corrects to 124 % for alv volume      Excessive daytime sleepiness 04/04/2015   Headache(784.0)    Headaches, cluster 06/20/2013   High cholesterol    Hypertension    Insomnia    Obstructive sleep apnea 07/05/2012   Patient diagnosed with OSA and using CPAP at 6 cm water , with residual AHI of 0.8 on 07-04-12 . [facility] is his  DME .     Persistent headaches 07/05/2012    Referred to [facility] Dr. Neale Burly in 2013 -     Pneumonia,  viral 06/20/2013   Taste absent     Past Surgical History:  Procedure Laterality Date   CHOLECYSTECTOMY  2003   FOOT SURGERY  2008   HERNIA REPAIR  [id number]   MOUTH SURGERY      Family History  Problem Relation Age of Onset   Prostate cancer Father    Cancer - Prostate Father    Allergies Sister    Heart disease Maternal Grandmother    Cancer - Prostate Maternal Grandfather    Prostate cancer Maternal Grandfather    COPD Maternal Grandfather    Heart disease Paternal Grandmother    Parkinson's disease Maternal Uncle    Alzheimer's disease Paternal Aunt        in their late 35's   Alzheimer's disease Paternal Uncle        in their late 101's    Social History   Occupational History   Occupation: fireman    Comment: Neurosurgeon: [employer]  Tobacco Use   Smoking status: Former    Packs/day: 0.25    Years: 5.00    Additional pack years: 0.00    Total pack years: 1.25    Types: Cigarettes    Quit date: 03/02/1990    Years  since quitting: 32.5   Smokeless tobacco: Never  Vaping Use   Vaping Use: Never used  Substance and Sexual Activity   Alcohol use: Not Currently    Comment: occasionally   Drug use: No   Sexual activity: Not on file     Physical Exam: Blood pressure 120/70, pulse 89, temperature 97.7 F (36.5 C), temperature source Oral, height 5\' 10"  (1.778 m), weight 175 lb 3.2 oz (79.5 kg), SpO2 97 %.  Gen:      No acute distress ENT:  no nasal polyps, mucus membranes moist Lungs:    No increased respiratory effort, symmetric chest wall excursion, clear to auscultation bilaterally, no wheezes or crackles CV:         Regular rate and rhythm; no murmurs, rubs, or gallops.  No pedal edema   Data Reviewed: Imaging: I have personally reviewed the chest xray Dec 2023 shows hazy interstitial opacities.   PFTs: PFTs reviewed - normal pulmonary function 2024    Latest Ref Rng & Units 10/15/2014    9:55 AM   PFT Results  FVC-Pre L 3.83   FVC-Predicted Pre % 76   FVC-Post L 3.88   FVC-Predicted Post % 77   Pre FEV1/FVC % % 82   Post FEV1/FCV % % 88   FEV1-Pre L 3.12   FEV1-Predicted Pre % 80   FEV1-Post L 3.40   DLCO uncorrected ml/min/mmHg 26.46   DLCO UNC% % 83   DLVA Predicted % 124   TLC L 5.01   TLC % Predicted % 73   RV % Predicted % 72     Labs: Lab Results  Component Value Date   NA 137 02/17/2022   K 3.9 02/17/2022   CO2 25 02/17/2022   GLUCOSE 110 (H) 02/17/2022   BUN 8 02/17/2022   CREATININE 0.94 02/17/2022   CALCIUM 9.0 02/17/2022   GFRNONAA >60 02/17/2022   Lab Results  Component Value Date   WBC 5.0 02/17/2022   HGB 12.9 (L) 02/17/2022   HCT 37.6 (L) 02/17/2022   MCV 87.9 02/17/2022   PLT 172 02/17/2022    Immunization status: Immunization History  Administered Date(s) Administered   Influenza Inj Mdck Quad Pf 12/30/2016, 11/30/2017   Influenza Split 11/30/2013   Influenza-Unspecified 12/01/2018, 10/31/2021   Tdap 03/24/2014    External Records Personally Reviewed: family medicine  Assessment:  Severe persistent asthma not well controlled Allergic rhinitis not well controlled GERD, on PPI, improved control  Plan/Recommendations: Breathing testing today was normal which is consistent with asthma diagnosis. Feno today 15 ppb.  Increase your advair to 500 1 puff twice daily.   Please get blood work today for your allergies. Gargle after use.   Continue the albuterol as needed.   Continue astelin nasal spray and cetirizine. I am adding a pill called montelukast to help with allergies too.   Return to Care: Return in about 3 months (around 11/26/2022).   Durel Salts, MD Pulmonary and Critical Care Medicine [facility] [phone]

## 2022-08-27 LAB — RESPIRATORY ALLERGY PROFILE REGION II ~~LOC~~

## 2022-08-27 LAB — INTERPRETATION:

## 2022-09-15 LAB — PULMONARY FUNCTION TEST
DL/VA % pred: 117 %
DL/VA: 5.07 ml/min/mmHg/L
DLCO cor % pred: 103 %
DLCO cor: 29.25 ml/min/mmHg
DLCO unc % pred: 103 %
DLCO unc: 29.25 ml/min/mmHg
FEF 25-75 Post: 3.53 L/sec
FEF 25-75 Pre: 3.68 L/sec
FEF2575-%Change-Post: -3 %
FEF2575-%Pred-Post: 111 %
FEF2575-%Pred-Pre: 115 %
FEV1-%Change-Post: 0 %
FEV1-%Pred-Post: 77 %
FEV1-%Pred-Pre: 78 %
FEV1-Post: 2.91 L
FEV1-Pre: 2.93 L
FEV1FVC-%Change-Post: 1 %
FEV1FVC-%Pred-Pre: 111 %
FEV6-%Change-Post: -1 %
FEV6-%Pred-Post: 71 %
FEV6-%Pred-Pre: 73 %
FEV6-Post: 3.37 L
FEV6-Pre: 3.43 L
FEV6FVC-%Pred-Post: 104 %
FEV6FVC-%Pred-Pre: 104 %
FVC-%Change-Post: -1 %
FVC-%Pred-Post: 68 %
FVC-%Pred-Pre: 70 %
FVC-Post: 3.37 L
Post FEV1/FVC ratio: 86 %
Post FEV6/FVC ratio: 100 %
Pre FEV1/FVC ratio: 85 %
Pre FEV6/FVC Ratio: 100 %
RV % pred: 101 %
RV: 2.19 L
TLC % pred: 73 %
TLC: 5.11 L

## 2022-10-13 DIAGNOSIS — E782 Mixed hyperlipidemia: Secondary | ICD-10-CM | POA: Diagnosis not present

## 2022-10-13 DIAGNOSIS — Z125 Encounter for screening for malignant neoplasm of prostate: Secondary | ICD-10-CM | POA: Diagnosis not present

## 2022-10-13 DIAGNOSIS — I10 Essential (primary) hypertension: Secondary | ICD-10-CM | POA: Diagnosis not present

## 2022-10-13 DIAGNOSIS — J45909 Unspecified asthma, uncomplicated: Secondary | ICD-10-CM | POA: Diagnosis not present

## 2022-10-13 DIAGNOSIS — N400 Enlarged prostate without lower urinary tract symptoms: Secondary | ICD-10-CM | POA: Diagnosis not present

## 2022-10-13 DIAGNOSIS — Z0000 Encounter for general adult medical examination without abnormal findings: Secondary | ICD-10-CM | POA: Diagnosis not present

## 2022-11-12 ENCOUNTER — Ambulatory Visit: Payer: BC Managed Care – PPO | Admitting: Internal Medicine

## 2022-11-12 ENCOUNTER — Encounter: Payer: Self-pay | Admitting: Internal Medicine

## 2022-11-12 VITALS — BP 116/80 | HR 80 | Ht 70.0 in | Wt 171.2 lb

## 2022-11-12 DIAGNOSIS — J301 Allergic rhinitis due to pollen: Secondary | ICD-10-CM | POA: Diagnosis not present

## 2022-11-12 DIAGNOSIS — Z23 Encounter for immunization: Secondary | ICD-10-CM

## 2022-11-12 DIAGNOSIS — J4540 Moderate persistent asthma, uncomplicated: Secondary | ICD-10-CM

## 2022-11-12 DIAGNOSIS — K219 Gastro-esophageal reflux disease without esophagitis: Secondary | ICD-10-CM

## 2022-11-12 MED ORDER — ALBUTEROL SULFATE (2.5 MG/3ML) 0.083% IN NEBU: 2.5000 mg | INHALATION_SOLUTION | Freq: Four times a day (QID) | RESPIRATORY_TRACT | 12 refills | Status: DC | PRN

## 2022-11-12 MED ORDER — ALBUTEROL SULFATE HFA 108 (90 BASE) MCG/ACT IN AERS: 2.0000 | INHALATION_SPRAY | RESPIRATORY_TRACT | 5 refills | Status: DC | PRN

## 2022-11-12 MED ORDER — SPIRIVA RESPIMAT 2.5 MCG/ACT IN AERS: 2.0000 | INHALATION_SPRAY | Freq: Every day | RESPIRATORY_TRACT | 11 refills | Status: DC

## 2022-11-12 NOTE — Patient Instructions (Addendum)
It was a pleasure to see you today!  Please schedule follow up scheduled with myself in 3 months.  If my schedule is not open yet, we will contact you with a reminder closer to that time. Please call [phone] if you haven't heard from [place] a month before, and always call us sooner if issues or concerns arise. You can also send [place] a message through MyChart, but but aware that this is not to be used for urgent issues and it may take up to 5-7 days to receive a reply. Please be aware that you will likely be able to view your results before I have a chance to respond to them. Please give [place] 5 business days to respond to any non-urgent results.  It was a pleasure to see you today!  Continue advair at high dose 1 puff twice daily We will add spiriva inhaler 2 puffs once daily.  I am sending nebulized albuterol to your pharmacy and sending a nebulizer machine to your home through a DME company.  Continue albuterol either inhaler or nebulized as needed up to 6 times/day.   Continue acid reflux medication Continue astelin, cetirizine, montelukast for allergies.   Flu shot today.  WE will discuss biologic therapy for asthma if you are not better with these changes.

## 2022-11-12 NOTE — Progress Notes (Signed)
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 Not doing well. Taking albuterol 8 times a day for chest tightness. Breathing gets worse as the day goes on. Having worsening voice hoarseness No prednisone courses since I saw him last.   Current Regimen: 500 advair 1 puff twice daily Asthma Triggers: URI, seasonal environmental allergies, exertion.  Exacerbations in the last year: 6-7 times/last 12 months.  History of hospitalization or intubation: never intubated Allergy Testing: never had GERD: yes on PPI Allergic Rhinitis: zyrtec ACT:  Asthma Control Test ACT Total Score  11/12/2022  8:38 AM 6  05/26/2022  2:20 PM 9   FeNO: 15 ppb  I have reviewed the patient's family social and past medical history and updated as appropriate.   Past Medical History:  Diagnosis Date   Bell's palsy 2008   Dr Anne Hahn   Dyspnea 08/28/2014   Followed in Pulmonary clinic/ [facility]/ [facility]  - 08/28/2014  Walked RA x 3 laps @ 185 ft each stopped due to end of study, fast pace, no doe or desats    - trial off acei 08/28/2014 >> improved 10/15/2014  - PFT's  10/21/2014  FEV1 3.40 (87 % ) ratio 88  p 6 % improvement from saba with DLCO  83 % corrects to 124 % for alv volume      Excessive daytime sleepiness 04/04/2015   Headache(784.0)    Headaches, cluster 06/20/2013   High cholesterol    Hypertension    Insomnia    Obstructive sleep apnea 07/05/2012   Patient diagnosed with OSA and using CPAP at 6 cm water , with residual AHI of 0.8 on 07-04-12 . [facility] is his  DME .     Persistent headaches 07/05/2012    Referred to [facility] Dr.  Neale Burly in 2013 -     Pneumonia, viral 06/20/2013   Taste absent     Past Surgical History:  Procedure Laterality Date   CHOLECYSTECTOMY  2003   FOOT SURGERY  2008   HERNIA REPAIR  [id number]   MOUTH SURGERY      Family History  Problem Relation Age of Onset   Prostate cancer Father    Cancer - Prostate Father    Allergies Sister    Heart disease Maternal Grandmother    Cancer - Prostate Maternal Grandfather    Prostate cancer Maternal Grandfather    COPD Maternal Grandfather    Heart disease Paternal Grandmother    Parkinson's disease Maternal Uncle    Alzheimer's disease Paternal Aunt        in their late 4's   Alzheimer's disease Paternal Uncle        in their late 26's    Social History   Occupational History   Occupation: fireman    Comment: Neurosurgeon: [employer]  Tobacco Use   Smoking status: Former    Current packs/day: 0.00    Average packs/day: 0.3 packs/day for 5.0 years (1.3 ttl pk-yrs)    Types: Cigarettes  Start date: 03/02/1985    Quit date: 03/02/1990    Years since quitting: 32.7   Smokeless tobacco: Never  Vaping Use   Vaping status: Never Used  Substance and Sexual Activity   Alcohol use: Not Currently    Comment: occasionally   Drug use: No   Sexual activity: Not on file     Physical Exam: Blood pressure 116/80, pulse 80, height 5\' 10"  (1.778 m), weight 171 lb 3.2 oz (77.7 kg), SpO2 95%.  Gen:      No acute distress, ambulating with cane ENT:  no thrush Lungs:   ctab, no wheezes or crackles CV:       RRR no mrg   Data Reviewed: Imaging: I have personally reviewed the chest xray Dec 2023 shows hazy interstitial opacities.   PFTs: PFTs reviewed - normal pulmonary function 2024    Latest Ref Rng & Units 08/26/2022    2:00 PM 10/15/2014    9:55 AM  PFT Results  FVC-Pre L  3.83   FVC-Predicted Pre % 70  76   FVC-Post L 3.37  3.88   FVC-Predicted Post % 68  77   Pre FEV1/FVC %  % 85  82   Post FEV1/FCV % % 86  88   FEV1-Pre L 2.93  3.12   FEV1-Predicted Pre % 78  80   FEV1-Post L 2.91  3.40   DLCO uncorrected ml/min/mmHg 29.25  26.46   DLCO UNC% % 103  83   DLCO corrected ml/min/mmHg 29.25    DLCO COR %Predicted % 103    DLVA Predicted % 117  124   TLC L 5.11  5.01   TLC % Predicted % 73  73   RV % Predicted % 101  72     Labs: Lab Results  Component Value Date   NA 137 02/17/2022   K 3.9 02/17/2022   CO2 25 02/17/2022   GLUCOSE 110 (H) 02/17/2022   BUN 8 02/17/2022   CREATININE 0.94 02/17/2022   CALCIUM 9.0 02/17/2022   GFRNONAA >60 02/17/2022   Lab Results  Component Value Date   WBC 5.4 08/26/2022   HGB 14.1 08/26/2022   HCT 41.4 08/26/2022   MCV 88.6 08/26/2022   PLT 175.0 08/26/2022    Immunization status: Immunization History  Administered Date(s) Administered   Influenza Inj Mdck Quad Pf 12/30/2016, 11/30/2017   Influenza Split 11/30/2013   Influenza-Unspecified 12/01/2018, 10/31/2021   Tdap 03/24/2014    External Records Personally Reviewed: family medicine  Assessment:  Severe persistent asthma not well controlled Allergic rhinitis controlled GERD, on PPI, control  Plan/Recommendations: Continue advair at high dose 1 puff twice daily We will add spiriva inhaler 2 puffs once daily.  I am sending nebulized albuterol to your pharmacy and sending a nebulizer machine to your home through a DME company.  Continue albuterol either inhaler or nebulized as needed up to 6 times/day.   Continue acid reflux medication Continue astelin, cetirizine, montelukast for allergies.    Flu shot today.  WE will discuss biologic therapy for asthma if you are not better with these changes.   Return to Care: Return in about 3 months (around 02/11/2023).   Durel Salts, MD Pulmonary and Critical Care Medicine [facility] Office:[phone]

## 2022-11-18 DIAGNOSIS — S8001XA Contusion of right knee, initial encounter: Secondary | ICD-10-CM | POA: Diagnosis not present

## 2022-12-04 DIAGNOSIS — J45909 Unspecified asthma, uncomplicated: Secondary | ICD-10-CM | POA: Diagnosis not present

## 2022-12-04 DIAGNOSIS — F411 Generalized anxiety disorder: Secondary | ICD-10-CM | POA: Diagnosis not present

## 2022-12-04 DIAGNOSIS — R0789 Other chest pain: Secondary | ICD-10-CM | POA: Diagnosis not present

## 2022-12-04 DIAGNOSIS — K219 Gastro-esophageal reflux disease without esophagitis: Secondary | ICD-10-CM | POA: Diagnosis not present

## 2022-12-09 ENCOUNTER — Emergency Department (HOSPITAL_BASED_OUTPATIENT_CLINIC_OR_DEPARTMENT_OTHER)
Admission: EM | Admit: 2022-12-09 | Discharge: 2022-12-10 | Disposition: A | Discharge status: Home / Self Care | Payer: BC Managed Care – PPO | Attending: Emergency Medicine | Admitting: Emergency Medicine

## 2022-12-09 ENCOUNTER — Emergency Department (HOSPITAL_BASED_OUTPATIENT_CLINIC_OR_DEPARTMENT_OTHER): Payer: BC Managed Care – PPO

## 2022-12-09 ENCOUNTER — Other Ambulatory Visit: Payer: Self-pay

## 2022-12-09 ENCOUNTER — Encounter (HOSPITAL_BASED_OUTPATIENT_CLINIC_OR_DEPARTMENT_OTHER): Payer: Self-pay

## 2022-12-09 ENCOUNTER — Emergency Department (HOSPITAL_BASED_OUTPATIENT_CLINIC_OR_DEPARTMENT_OTHER): Payer: BC Managed Care – PPO | Admitting: Radiology

## 2022-12-09 DIAGNOSIS — Z79899 Other long term (current) drug therapy: Secondary | ICD-10-CM | POA: Insufficient documentation

## 2022-12-09 DIAGNOSIS — R0789 Other chest pain: Secondary | ICD-10-CM | POA: Diagnosis not present

## 2022-12-09 DIAGNOSIS — Z9049 Acquired absence of other specified parts of digestive tract: Secondary | ICD-10-CM | POA: Diagnosis not present

## 2022-12-09 DIAGNOSIS — R0602 Shortness of breath: Secondary | ICD-10-CM | POA: Insufficient documentation

## 2022-12-09 DIAGNOSIS — I10 Essential (primary) hypertension: Secondary | ICD-10-CM | POA: Diagnosis not present

## 2022-12-09 DIAGNOSIS — R079 Chest pain, unspecified: Secondary | ICD-10-CM | POA: Insufficient documentation

## 2022-12-09 DIAGNOSIS — R030 Elevated blood-pressure reading, without diagnosis of hypertension: Secondary | ICD-10-CM | POA: Diagnosis not present

## 2022-12-09 LAB — BASIC METABOLIC PANEL
Anion gap: 7 (ref 5–15)
BUN: 14 mg/dL (ref 6–20)
CO2: 29 mmol/L (ref 22–32)
Calcium: 9.9 mg/dL (ref 8.9–10.3)
Chloride: 103 mmol/L (ref 98–111)
Creatinine, Ser: 1.01 mg/dL (ref 0.61–1.24)
GFR, Estimated: >60 mL/min (ref >60)
Glucose, Bld: 86 mg/dL (ref 70–99)
Potassium: 4.1 mmol/L (ref 3.5–5.1)
Sodium: 139 mmol/L (ref 135–145)

## 2022-12-09 LAB — CBC
HCT: 41.7 % (ref 39.0–52.0)
Hemoglobin: 14.5 g/dL (ref 13.0–17.0)
MCH: 30.1 pg (ref 26.0–34.0)
MCHC: 34.8 g/dL (ref 30.0–36.0)
MCV: 86.7 fL (ref 80.0–100.0)
Platelets: 211 10*3/uL (ref 150–400)
RBC: 4.81 MIL/uL (ref 4.22–5.81)
RDW: 12.2 % (ref 11.5–15.5)
WBC: 5.2 10*3/uL (ref 4.0–10.5)
nRBC: 0 % (ref 0.0–0.2)

## 2022-12-09 LAB — TROPONIN I (HIGH SENSITIVITY)
Troponin I (High Sensitivity): 3 ng/L (ref <18)
Troponin I (High Sensitivity): 3 ng/L (ref <18)

## 2022-12-09 LAB — D-DIMER, QUANTITATIVE: D-Dimer, Quant: 1.11 ug{FEU}/mL — ABNORMAL HIGH (ref 0.00–0.50)

## 2022-12-09 MED ORDER — NITROGLYCERIN 0.4 MG SL SUBL
0.4000 mg | SUBLINGUAL_TABLET | Freq: Once | SUBLINGUAL | Status: AC
  Administered 2022-12-09: 0.4 mg via SUBLINGUAL
  Filled 2022-12-09: qty 1

## 2022-12-09 MED ORDER — IOHEXOL 350 MG/ML SOLN
100.0000 mL | Freq: Once | INTRAVENOUS | Status: AC | PRN
  Administered 2022-12-09: 75 mL via INTRAVENOUS

## 2022-12-09 MED ORDER — IPRATROPIUM-ALBUTEROL 0.5-2.5 (3) MG/3ML IN SOLN
3.0000 mL | Freq: Once | RESPIRATORY_TRACT | Status: AC
  Administered 2022-12-09: 3 mL via RESPIRATORY_TRACT
  Filled 2022-12-09: qty 3

## 2022-12-09 NOTE — ED Provider Notes (Signed)
Care assumed from Dr. Rubin Payor, patient with chest pain, shortness of breath pending repeat troponin and CT angiogram of chest.  Repeat troponin is normal, CT angiogram shows no evidence of pulmonary embolism or other acute process.  Have independently viewed the images, and agree with the radiologist's interpretation.  He did have some subjective improvement with nebulizer treatment.  He does have a home nebulizer.  At this point, I wonder if his symptoms might be related to bronchospasm.  I have ordered a dose of prednisone and I am discharging him with a prescription for prednisone and he is instructed to continue using his nebulizer as needed.  Regarding his blood pressure, I have advised him to continue to monitor it at home, discuss possible adjustments in medication with primary care provider.  Results for orders placed or performed during the hospital encounter of 12/09/22  Basic metabolic panel  Result Value Ref Range   Sodium 139 135 - 145 mmol/L   Potassium 4.1 3.5 - 5.1 mmol/L   Chloride 103 98 - 111 mmol/L   CO2 29 22 - 32 mmol/L   Glucose, Bld 86 70 - 99 mg/dL   BUN 14 6 - 20 mg/dL   Creatinine, Ser 0.98 0.61 - 1.24 mg/dL   Calcium 9.9 8.9 - 11.9 mg/dL   GFR, Estimated >14 >78 mL/min   Anion gap 7 5 - 15  CBC  Result Value Ref Range   WBC 5.2 4.0 - 10.5 K/uL   RBC 4.81 4.22 - 5.81 MIL/uL   Hemoglobin 14.5 13.0 - 17.0 g/dL   HCT 29.5 62.1 - 30.8 %   MCV 86.7 80.0 - 100.0 fL   MCH 30.1 26.0 - 34.0 pg   MCHC 34.8 30.0 - 36.0 g/dL   RDW 65.7 84.6 - 96.2 %   Platelets 211 150 - 400 K/uL   nRBC 0.0 0.0 - 0.2 %  D-dimer, quantitative  Result Value Ref Range   D-Dimer, Quant 1.11 (H) 0.00 - 0.50 ug/mL-FEU  Troponin I (High Sensitivity)  Result Value Ref Range   Troponin I (High Sensitivity) 3 <18 ng/L  Troponin I (High Sensitivity)  Result Value Ref Range   Troponin I (High Sensitivity) 3 <18 ng/L   CT Angio Chest PE W and/or Wo Contrast  Result Date:  12/10/2022 CLINICAL DATA:  Pulmonary embolism (PE) suspected, low to intermediate prob, positive D-dimer chest tightness and elevated blood pressure (patient had medics come to his home his BP was in the 170's systolic). EXAM: CT ANGIOGRAPHY CHEST WITH CONTRAST TECHNIQUE: Multidetector CT imaging of the chest was performed using the standard protocol during bolus administration of intravenous contrast. Multiplanar CT image reconstructions and MIPs were obtained to evaluate the vascular anatomy. RADIATION DOSE REDUCTION: This exam was performed according to the departmental dose-optimization program which includes automated exposure control, adjustment of the mA and/or kV according to patient size and/or use of iterative reconstruction technique. CONTRAST:  75mL OMNIPAQUE IOHEXOL 350 MG/ML SOLN COMPARISON:  Chest x-ray 12/09/2022, CT angio chest 04/26/2014 FINDINGS: Cardiovascular: Satisfactory opacification of the pulmonary arteries to the segmental level. No evidence of pulmonary embolism. Normal heart size. No significant pericardial effusion. The thoracic aorta is normal in caliber. No atherosclerotic plaque of the thoracic aorta. No coronary artery calcifications. Mediastinum/Nodes: No enlarged mediastinal, hilar, or axillary lymph nodes. Thyroid gland, trachea, and esophagus demonstrate no significant findings. Lungs/Pleura: No focal consolidation. No pulmonary nodule. No pulmonary mass. No pleural effusion. No pneumothorax. Upper Abdomen: No acute abnormality.  Status post  cholecystectomy. Musculoskeletal: No chest wall abnormality. No suspicious lytic or blastic osseous lesions. No acute displaced fracture. Multilevel degenerative changes of the spine. Review of the MIP images confirms the above findings. IMPRESSION: 1. No pulmonary embolus 2. No acute intrathoracic abnormality. Electronically Signed   By: Tish Frederickson M.D.   On: 12/10/2022 00:38   DG Chest 2 View  Result Date: 12/09/2022 CLINICAL  DATA:  Chest tightness and elevated blood pressure EXAM: CHEST - 2 VIEW COMPARISON:  Radiographs 05/07/2022 FINDINGS: Stable cardiomediastinal silhouette. No focal consolidation, pleural effusion, or pneumothorax. No displaced rib fractures. IMPRESSION: No acute cardiopulmonary disease. Electronically Signed   By: Minerva Fester M.D.   On: 12/09/2022 20:37      Dione Booze, MD 12/10/22 [phone]

## 2022-12-09 NOTE — ED Triage Notes (Signed)
Patient arrives POV from home with complaints of chest tightness and elevated blood pressure (patient had medics come to his home his BP was in the 170's systolic). Rates discomfort a 9/10.  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

## 2022-12-09 NOTE — ED Provider Notes (Signed)
[place] EMERGENCY DEPARTMENT AT [facility] Provider Note   CSN: [id number] Arrival date & time: 12/09/22  1803     History  Chief Complaint  Patient presents with   Hypertension   Chest Pain    Clarence Cogswell Dibari is a 57 y.o. male.   Hypertension Associated symptoms include chest pain.  Chest Pain Patient with chest pain and hypertension.  Began yesterday.  States it is from his nipple line up.  More short of breath with exertion.  More tightness.  Does have a history of obstructive sleep apnea.  History of dyspnea.  No known cardiac history.  Is a retired IT sales professional.  He states he was at training yesterday and his chest has been somewhat tight but then got up with of smoking became more short of breath.  No swelling in his legs.  States he does have some chronic pain in his right leg.  No real coughing.  States he has been getting more short of breath with exertion.    Past Medical History:  Diagnosis Date   Bell's palsy 2008   Dr Anne Hahn   Dyspnea 08/28/2014   Followed in Pulmonary clinic/ [facility]/ [facility]  - 08/28/2014  Walked RA x 3 laps @ 185 ft each stopped due to end of study, fast pace, no doe or desats    - trial off acei 08/28/2014 >> improved 10/15/2014  - PFT's  10/21/2014  FEV1 3.40 (87 % ) ratio 88  p 6 % improvement from saba with DLCO  83 % corrects to 124 % for alv volume      Excessive daytime sleepiness 04/04/2015   Headache(784.0)    Headaches, cluster 06/20/2013   High cholesterol    Hypertension    Insomnia    Obstructive sleep apnea 07/05/2012   Patient diagnosed with OSA and using CPAP at 6 cm water , with residual AHI of 0.8 on 07-04-12 . [employer] is his  DME .     Persistent headaches 07/05/2012    Ref[facility] Dr. Neale Burly in 2013 -     Pneumonia, viral 06/20/2013   Taste absent     Home Medications Prior to Admission medications   Medication Sig Start Date End Date Taking? Authorizing Provider  albuterol  (PROVENTIL) (2.5 MG/3ML) 0.083% nebulizer solution Take 3 mLs (2.5 mg total) by nebulization every 6 (six) hours as needed for wheezing or shortness of breath. 11/12/22   Charlott Holler, MD  albuterol (VENTOLIN HFA) 108 (90 Base) MCG/ACT inhaler Inhale 2 puffs into the lungs every 4 (four) hours as needed. 11/12/22   Charlott Holler, MD  ALPRAZolam Prudy Feeler) 1 MG tablet Take 1 mg by mouth at bedtime. 01/26/22   [provider]  azelastine (ASTELIN) 0.1 % nasal spray Place 2 sprays into both nostrils 2 (two) times daily. Use in each nostril as directed    [provider]  cetirizine (ZYRTEC) 10 MG tablet Take 10 mg by mouth daily as needed.     [provider]  CIALIS 5 MG tablet Take 5 mg by mouth daily.  03/16/14   [provider]  diclofenac (VOLTAREN) 75 MG EC tablet Take 1 tablet (75 mg total) by mouth 2 (two) times daily as needed. 06/08/22   Rodolph Bong, MD  fenofibrate 160 MG tablet Take 160 mg by mouth daily.    [provider]  fluticasone-salmeterol (ADVAIR) 500-50 MCG/ACT AEPB Inhale 1 puff into the lungs in the  morning and at bedtime. 08/26/22   Charlott Holler, MD  ipratropium (ATROVENT) 0.06 % nasal spray Place 2 sprays into both nostrils 2 (two) times daily.    [provider]  montelukast (SINGULAIR) 10 MG tablet Take 1 tablet (10 mg total) by mouth at bedtime. 08/26/22   Charlott Holler, MD  omeprazole (PRILOSEC) 20 MG capsule Take 20 mg by mouth daily.    [provider]  oxyCODONE-acetaminophen (PERCOCET/ROXICET) 5-325 MG tablet Take 1 tablet by mouth every 8 (eight) hours as needed for severe pain. 06/29/22   Rodolph Bong, MD  Plecanatide (TRULANCE) 3 MG TABS Take 1 tablet by mouth daily.    [provider]  sodium chloride 0.9 % nebulizer solution SMARTSIG:5 Milliliter(s) Via Inhaler As Directed 08/13/19   [provider]  Tiotropium Bromide Monohydrate (SPIRIVA RESPIMAT) 2.5 MCG/ACT AERS Inhale 2 puffs into  the lungs daily. 11/12/22   Charlott Holler, MD  traZODone (DESYREL) 150 MG tablet TAKE 1 TABLET BY MOUTH EVERYDAY AT BEDTIME 07/03/20   Butch Penny, NP  valsartan (DIOVAN) 160 MG tablet TAKE 1 TABLET BY MOUTH EVERY DAY 07/08/18   Wendall Stade, MD  venlafaxine XR (EFFEXOR-XR) 75 MG 24 hr capsule Take 75 mg by mouth daily with breakfast.    [provider]      Allergies    Amitriptyline, Silenor [doxepin hcl], Tribenzor [olmesartan-amlodipine-hctz], Bupropion, Dexmethylphenidate, Rimegepant sulfate, and Topiramate    Review of Systems   Review of Systems  Cardiovascular:  Positive for chest pain.    Physical Exam Updated Vital Signs BP (!) 160/93   Pulse 64   Temp 98.5 F (36.9 C) (Oral)   Resp (!) 21   Ht 5\' 10"  (1.778 m)   Wt 77.7 kg   SpO2 100%   BMI 24.58 kg/m  Physical Exam Vitals reviewed.  Cardiovascular:     Rate and Rhythm: Regular rhythm.  Pulmonary:     Breath sounds: No wheezing or rhonchi.  Chest:     Chest wall: No tenderness.  Abdominal:     Tenderness: There is no abdominal tenderness.  Musculoskeletal:     Cervical back: Neck supple.     Right lower leg: No edema.     Left lower leg: No edema.  Neurological:     Mental Status: He is alert.     ED Results / Procedures / Treatments   Labs (all labs ordered are listed, but only abnormal results are displayed) Labs Reviewed  D-DIMER, QUANTITATIVE - Abnormal; Notable for the following components:      Result Value   D-Dimer, Quant 1.11 (*)    All other components within normal limits  BASIC METABOLIC PANEL  CBC  TROPONIN I (HIGH SENSITIVITY)  TROPONIN I (HIGH SENSITIVITY)    EKG EKG Interpretation Date/Time:  Wednesday December 09 2022 18:13:43 EDT Ventricular Rate:  84 PR Interval:  152 QRS Duration:  100 QT Interval:  356 QTC Calculation: 420 R Axis:   73  Text Interpretation: Normal sinus rhythm Normal ECG When compared with ECG of 17-Feb-2022 14:43, No significant change  was found Confirmed by Benjiman Core [phone]) on 12/09/2022 9:21:38 PM  Radiology DG Chest 2 View  Result Date: 12/09/2022 CLINICAL DATA:  Chest tightness and elevated blood pressure EXAM: CHEST - 2 VIEW COMPARISON:  Radiographs 05/07/2022 FINDINGS: Stable cardiomediastinal silhouette. No focal consolidation, pleural effusion, or pneumothorax. No displaced rib fractures. IMPRESSION: No acute cardiopulmonary disease. Electronically Signed   By: Joselyn Glassman  Stutzman M.D.   On: 12/09/2022 20:37    Procedures Procedures    Medications Ordered in ED Medications  nitroGLYCERIN (NITROSTAT) SL tablet 0.4 mg (0.4 mg Sublingual Given 12/09/22 2208)    ED Course/ Medical Decision Making/ A&P                                 Medical Decision Making Amount and/or Complexity of Data Reviewed Labs: ordered. Radiology: ordered.  Risk Prescription drug management.   Patient with chest pain.  Some shortness of breath.  Also hypertension.  Differential diagnosis includes severe hyper tension, pneumonia, URI symptoms, pulmonary embolism.    EKG reassuring.  First troponin negative.  Chest x-ray and apparently interpreted also reassuring.  However has a D-dimer that is elevated.  Will get CT scan to evaluate.  Given nitroglycerin which helped to the blood pressure and Help improve the chest pain.  History of asthma.  Will add breathing treatment to see if that helps.  He ambulated and was not hypoxic but did get tachycardic quickly.  Care will be turned over to Dr. Preston Fleeting.       Final Clinical Impression(s) / ED Diagnoses Final diagnoses:  None    Rx / DC Orders ED Discharge Orders     None         Benjiman Core, MD 12/09/22 2307

## 2022-12-09 NOTE — Progress Notes (Signed)
Patient ambulated around the department with the use of his cane.  His heart rate rose to 107 bpm.  Respiratory rate rose to 22 bpm and his SpO2 stayed at 98%.  Patient did have to stop for three breaks for shortness of breath.

## 2022-12-10 DIAGNOSIS — R079 Chest pain, unspecified: Secondary | ICD-10-CM | POA: Diagnosis not present

## 2022-12-10 MED ORDER — PREDNISONE 50 MG PO TABS: 50.0000 mg | ORAL_TABLET | Freq: Every day | ORAL | 0 refills | Status: DC

## 2022-12-10 MED ORDER — PREDNISONE 50 MG PO TABS
60.0000 mg | ORAL_TABLET | Freq: Once | ORAL | Status: AC
  Administered 2022-12-10: 60 mg via ORAL
  Filled 2022-12-10: qty 1

## 2022-12-10 NOTE — ED Notes (Signed)
Reviewed AVS with patient, patient expressed understanding of directions, denies further questions at this time. 

## 2022-12-10 NOTE — Discharge Instructions (Addendum)
Continue using your albuterol nebulizer as needed.  Continue to monitor your blood pressure at home.  You should take your blood pressure once a day and keep a record of it.  You should take that record with you when you see your primary care provider.  Return to the emergency department if you have any new or concerning symptoms.

## 2022-12-14 ENCOUNTER — Telehealth: Payer: Self-pay | Admitting: Internal Medicine

## 2022-12-14 NOTE — Telephone Encounter (Signed)
Patient is dropping off a sickness insurance form that needs to be filled out by Dr.Desai. It is located in her box.

## 2022-12-18 NOTE — Telephone Encounter (Signed)
Routing to Dr. Celine Mans so she can be on the lookout for the form that was dropped off by pt.

## 2022-12-18 NOTE — Telephone Encounter (Signed)
I have the form but I have questions about how long he needs to be out of work. Need to schedule a virtual visit with him to discuss the form. Ok to use a morning blocked slot for a virtual visit only.

## 2022-12-18 NOTE — Telephone Encounter (Signed)
Yes I have received it. Will update here when it is complete.

## 2022-12-20 NOTE — Telephone Encounter (Signed)
Called patient but he did not answer. His VM is not setup. Will attempt to call back later.

## 2023-01-01 ENCOUNTER — Telehealth: Payer: Self-pay | Admitting: Pharmacist

## 2023-01-01 ENCOUNTER — Encounter: Payer: Self-pay | Admitting: Internal Medicine

## 2023-01-01 ENCOUNTER — Telehealth: Payer: BC Managed Care – PPO | Admitting: Internal Medicine

## 2023-01-01 DIAGNOSIS — J4550 Severe persistent asthma, uncomplicated: Secondary | ICD-10-CM

## 2023-01-01 NOTE — Progress Notes (Deleted)
Julian Oneal    [id number]    1965-06-05  Primary Care Physician:Shaw, Rockney Ghee, MD Date of Appointment: 01/01/2023 Established Patient Visit  Chief complaint:   Chief Complaint  Patient presents with   Asthma     HPI: Julian Oneal is a 57 y.o. 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 Not doing well. Taking albuterol 8 times a day for chest tightness. Breathing gets worse as the day goes on. Having worsening voice hoarseness No prednisone courses since I saw him last.   Current Regimen: 500 advair 1 puff twice daily Asthma Triggers: URI, seasonal environmental allergies, exertion.  Exacerbations in the last year: 6-7 times/last 12 months.  History of hospitalization or intubation: never intubated Allergy Testing: never had GERD: yes on PPI Allergic Rhinitis: zyrtec ACT:  Asthma Control Test ACT Total Score  11/12/2022  8:38 AM 6  05/26/2022  2:20 PM 9   FeNO: 15 ppb  I have reviewed the patient's family social and past medical history and updated as appropriate.   Past Medical History:  Diagnosis Date   Bell's palsy 2008   Dr Anne Hahn   Dyspnea 08/28/2014   Followed in Pulmonary clinic/ [facility]/ [facility]  - 08/28/2014  Walked RA x 3 laps @ 185 ft each stopped due to end of study, fast pace, no doe or desats    - trial off acei 08/28/2014 >> improved 10/15/2014  - PFT's  10/21/2014  FEV1 3.40 (87 % ) ratio 88  p 6 % improvement from saba with DLCO  83 % corrects to 124 % for alv volume      Excessive daytime sleepiness 04/04/2015   Headache(784.0)    Headaches, cluster 06/20/2013   High cholesterol    Hypertension    Insomnia    Obstructive sleep apnea 07/05/2012   Patient diagnosed with OSA and using CPAP at 6 cm water , with residual AHI of 0.8 on 07-04-12 . [employer] is his  DME .     Persistent headaches 07/05/2012    Referred to Headaches and wellness center Dr. Neale Burly in 2013 -      Pneumonia, viral 06/20/2013   Taste absent     Past Surgical History:  Procedure Laterality Date   CHOLECYSTECTOMY  2003   FOOT SURGERY  2008   HERNIA REPAIR  [id number]   MOUTH SURGERY      Family History  Problem Relation Age of Onset   Prostate cancer Father    Cancer - Prostate Father    Allergies Sister    Heart disease Maternal Grandmother    Cancer - Prostate Maternal Grandfather    Prostate cancer Maternal Grandfather    COPD Maternal Grandfather    Heart disease Paternal Grandmother    Parkinson's disease Maternal Uncle    Alzheimer's disease Paternal Aunt        in their late 79's   Alzheimer's disease Paternal Uncle        in their late 86's    Social History   Occupational History   Occupation: fireman    Comment: Neurosurgeon: [employer]  Tobacco Use   Smoking status: Former    Current packs/day: 0.00    Average packs/day: 0.3 packs/day for 5.0 years (1.3 ttl pk-yrs)    Types: Cigarettes    Start date: 03/02/1985  Quit date: 03/02/1990    Years since quitting: 32.8   Smokeless tobacco: Never  Vaping Use   Vaping status: Never Used  Substance and Sexual Activity   Alcohol use: Not Currently    Comment: occasionally   Drug use: No   Sexual activity: Not on file     Physical Exam: There were no vitals taken for this visit.  Gen:      No acute distress, ambulating with cane ENT:  no thrush Lungs:   ctab, no wheezes or crackles CV:       RRR no mrg   Data Reviewed: Imaging: I have personally reviewed the chest xray Dec 2023 shows hazy interstitial opacities.   PFTs: PFTs reviewed - normal pulmonary function 2024    Latest Ref Rng & Units 08/26/2022    2:00 PM 10/15/2014    9:55 AM  PFT Results  FVC-Pre L  3.83   FVC-Predicted Pre % 70  76   FVC-Post L 3.37  3.88   FVC-Predicted Post % 68  77   Pre FEV1/FVC % % 85  82   Post FEV1/FCV % % 86  88   FEV1-Pre L 2.93  3.12   FEV1-Predicted  Pre % 78  80   FEV1-Post L 2.91  3.40   DLCO uncorrected ml/min/mmHg 29.25  26.46   DLCO UNC% % 103  83   DLCO corrected ml/min/mmHg 29.25    DLCO COR %Predicted % 103    DLVA Predicted % 117  124   TLC L 5.11  5.01   TLC % Predicted % 73  73   RV % Predicted % 101  72     Labs: Lab Results  Component Value Date   NA 139 12/09/2022   K 4.1 12/09/2022   CO2 29 12/09/2022   GLUCOSE 86 12/09/2022   BUN 14 12/09/2022   CREATININE 1.01 12/09/2022   CALCIUM 9.9 12/09/2022   GFRNONAA >60 12/09/2022   Lab Results  Component Value Date   WBC 5.2 12/09/2022   HGB 14.5 12/09/2022   HCT 41.7 12/09/2022   MCV 86.7 12/09/2022   PLT 211 12/09/2022    Immunization status: Immunization History  Administered Date(s) Administered   Influenza Inj Mdck Quad Pf 12/30/2016, 11/30/2017   Influenza Split 11/30/2013   Influenza, Seasonal, Injecte, Preservative Fre 11/12/2022   Influenza-Unspecified 12/01/2018, 10/31/2021   Tdap 03/24/2014    External Records Personally Reviewed: family medicine  Assessment:  Severe persistent asthma not well controlled Allergic rhinitis controlled GERD, on PPI, control  Plan/Recommendations: Continue advair at high dose 1 puff twice daily We will add spiriva inhaler 2 puffs once daily.  I am sending nebulized albuterol to your pharmacy and sending a nebulizer machine to your home through a DME company.  Continue albuterol either inhaler or nebulized as needed up to 6 times/day.   Continue acid reflux medication Continue astelin, cetirizine, montelukast for allergies.    Flu shot today.  WE will discuss biologic therapy for asthma if you are not better with these changes.   Return to Care: Return in about 3 months (around 04/03/2023).   Durel Salts, MD Pulmonary and Critical Care Medicine [facility] Office:[phone]

## 2023-01-01 NOTE — Patient Instructions (Addendum)
It was a pleasure to see you today!  Please schedule follow up scheduled with myself in  months.  If my schedule is not open yet, we will contact you with a reminder closer to that time. Please call [phone] if you haven't heard from [place] a month before, and always call us sooner if issues or concerns arise. You can also send [place] a message through MyChart, but but aware that this is not to be used for urgent issues and it may take up to 5-7 days to receive a reply. Please be aware that you will likely be able to view your results before I have a chance to respond to them. Please give [place] 5 business days to respond to any non-urgent results.    We will new Start Dupixent. I am leaving the paperwork for his employment and dupixent enrollment form for at the front desk today for him.   Continue advair at high dose 1 puff twice daily Continue spiriva inhaler 2 puffs once daily.  Continue albuterol either inhaler or nebulized as needed up to 6 times/day.   Continue acid reflux medication Continue astelin, cetirizine, montelukast for allergies.   Will need to start Dupixent given worsening symptom control with glucocorticoid dependence.   I have filled out his VFIS paperwork for disability and left at front desk for him.

## 2023-01-01 NOTE — Telephone Encounter (Addendum)
Submitted a Prior Authorization request to [facility] for DUPIXENT via CoverMyMeds. Will update once we receive a response.  Key: [id number]  Chesley Mires, PharmD, MPH, BCPS, CPP Clinical Pharmacist (Rheumatology and Pulmonology)   ----- Message from Murrell Redden sent at 01/01/2023  9:44 AM EDT ----- New start Dupixent ----- Message ----- From: Charlott Holler, MD Sent: 01/01/2023   8:59 AM EDT To: Murrell Redden, RPH-CPP  New start dupixent

## 2023-01-01 NOTE — Telephone Encounter (Signed)
PY came in to complete Dupixant form. I made a copy and gave it to him and put on Pharm Box Attn; [facility]

## 2023-01-01 NOTE — Progress Notes (Addendum)
I connected with Julian Oneal on 01/01/2023 by video enabled telemedicine application and verified that I am speaking with the correct person using two identifiers. Patient is at home, Physician is in office.    I discussed the limitations of evaluation and management by telemedicine. The patient expressed understanding and agreed to proceed.        Julian Oneal    [id number]    1965/03/20  Primary Care Physician:Shaw, Rockney Ghee, MD Date of Appointment: 01/01/2023 Established Patient Visit  Chief complaint:   Chief Complaint  Patient presents with   Asthma     HPI: AYSON Oneal is a 57 y.o. 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 Asthma Symptoms started around 06/01/2001.  Interval Updates: Here for asthma follow up. Continues to have dyspnea on exertion.  Went to [facility] a couple of weeks ago for flare and was given prednisone.  Takes albuterol nebulizer 2-3 times/day. Which works better than albuterol inhaler (takes 5-6 times/day.) breathing always worse with exertion.  He was hypertensive at the time of ED visit in October. BP is better controlled now with increase in valsartan.  Here for visit to discuss paperwork for being out of work as well.  Allergies well controlled, no reflux issues.   Current Regimen: 500 advair 1 puff twice daily and spiriva once daily Asthma Triggers: URI, seasonal environmental allergies, exertion.  Exacerbations in the last year: 6-7 times/last 12 months. Last in October 2024 History of hospitalization or intubation: never intubated Allergy Testing: never had GERD: yes on PPI Allergic Rhinitis: zyrtec ACT:  Asthma Control Test ACT Total Score  11/12/2022  8:38 AM 6  05/26/2022  2:20 PM 9   FeNO: 15 ppb  I have reviewed the patient's family social and past medical history and updated as appropriate.   Past Medical History:  Diagnosis Date   Bell's palsy 2008   Dr Anne Hahn    Dyspnea 08/28/2014   Followed in Pulmonary clinic/ [facility]/ [facility]  - 08/28/2014  Walked RA x 3 laps @ 185 ft each stopped due to end of study, fast pace, no doe or desats    - trial off acei 08/28/2014 >> improved 10/15/2014  - PFT's  10/21/2014  FEV1 3.40 (87 % ) ratio 88  p 6 % improvement from saba with DLCO  83 % corrects to 124 % for alv volume      Excessive daytime sleepiness 04/04/2015   Headache(784.0)    Headaches, cluster 06/20/2013   High cholesterol    Hypertension    Insomnia    Obstructive sleep apnea 07/05/2012   Patient diagnosed with OSA and using CPAP at 6 cm water , with residual AHI of 0.8 on 07-04-12 . [employer] is his  DME .     Persistent headaches 07/05/2012    Referred to Headaches and wellness center Dr. Neale Burly in 2013 -     Pneumonia, viral 06/20/2013   Taste absent     Past Surgical History:  Procedure Laterality Date   CHOLECYSTECTOMY  2003   FOOT SURGERY  2008   HERNIA REPAIR  [phone]   MOUTH SURGERY      Family History  Problem Relation Age of Onset   Prostate cancer Father    Cancer - Prostate Father    Allergies Sister    Heart disease Maternal Grandmother    Cancer - Prostate Maternal Grandfather    Prostate cancer Maternal Grandfather  COPD Maternal Grandfather    Heart disease Paternal Grandmother    Parkinson's disease Maternal Uncle    Alzheimer's disease Paternal Aunt        in their late 24's   Alzheimer's disease Paternal Uncle        in their late 67's    Social History   Occupational History   Occupation: fireman    Comment: Neurosurgeon: [employer]  Tobacco Use   Smoking status: Former    Current packs/day: 0.00    Average packs/day: 0.3 packs/day for 5.0 years (1.3 ttl pk-yrs)    Types: Cigarettes    Start date: 03/02/1985    Quit date: 03/02/1990    Years since quitting: 32.8   Smokeless tobacco: Never  Vaping Use   Vaping status: Never Used  Substance and Sexual  Activity   Alcohol use: Not Currently    Comment: occasionally   Drug use: No   Sexual activity: Not on file     Physical Exam: There were no vitals taken for this visit. Given virtual visit   No coughing, no audible wheezing or respiratory distress   Data Reviewed: Imaging: I have personally reviewed the chest xray Dec 2023 shows hazy interstitial opacities.   PFTs: PFTs reviewed - normal pulmonary function 2024    Latest Ref Rng & Units 08/26/2022    2:00 PM 10/15/2014    9:55 AM  PFT Results  FVC-Pre L  3.83   FVC-Predicted Pre % 70  76   FVC-Post L 3.37  3.88   FVC-Predicted Post % 68  77   Pre FEV1/FVC % % 85  82   Post FEV1/FCV % % 86  88   FEV1-Pre L 2.93  3.12   FEV1-Predicted Pre % 78  80   FEV1-Post L 2.91  3.40   DLCO uncorrected ml/min/mmHg 29.25  26.46   DLCO UNC% % 103  83   DLCO corrected ml/min/mmHg 29.25    DLCO COR %Predicted % 103    DLVA Predicted % 117  124   TLC L 5.11  5.01   TLC % Predicted % 73  73   RV % Predicted % 101  72     Labs: Lab Results  Component Value Date   NA 139 12/09/2022   K 4.1 12/09/2022   CO2 29 12/09/2022   GLUCOSE 86 12/09/2022   BUN 14 12/09/2022   CREATININE 1.01 12/09/2022   CALCIUM 9.9 12/09/2022   GFRNONAA >60 12/09/2022   Lab Results  Component Value Date   WBC 5.2 12/09/2022   HGB 14.5 12/09/2022   HCT 41.7 12/09/2022   MCV 86.7 12/09/2022   PLT 211 12/09/2022    Immunization status: Immunization History  Administered Date(s) Administered   Influenza Inj Mdck Quad Pf 12/30/2016, 11/30/2017   Influenza Split 11/30/2013   Influenza, Seasonal, Injecte, Preservative Fre 11/12/2022   Influenza-Unspecified 12/01/2018, 10/31/2021   Tdap 03/24/2014    External Records Personally Reviewed: family medicine, ED  Assessment:  Severe persistent asthma not well controlled Allergic rhinitis controlled GERD, on PPI, controlled  Plan/Recommendations:  Continue advair at high dose 1 puff twice  daily Continue spiriva inhaler 2 puffs once daily.  Continue albuterol either inhaler or nebulized as needed up to 6 times/day.   Continue acid reflux medication Continue astelin, cetirizine, montelukast for allergies.   Will need to start Dupixent given worsening symptom control with glucocorticoid dependence.   I have  filled out his VFIS paperwork for disability and left at front desk for him.   I spent 40 minutes in the care of this patient today including pre-charting, chart review, review of results, face-to-face care, coordination of care and communication with consultants etc.).   Return to Care: Return in about 3 months (around 04/03/2023).   Durel Salts, MD Pulmonary and Critical Care Medicine [facility] Office:[phone]

## 2023-01-05 ENCOUNTER — Other Ambulatory Visit (HOSPITAL_COMMUNITY): Payer: Self-pay

## 2023-01-05 NOTE — Telephone Encounter (Signed)
Patient scheduled for Dupixent new start on 01/11/2023. Will use sample  Chesley Mires, PharmD, MPH, BCPS, CPP Clinical Pharmacist (Rheumatology and Pulmonology)

## 2023-01-05 NOTE — Telephone Encounter (Signed)
Received notification from [facility] regarding a prior authorization for DUPIXENT. Authorization has been APPROVED from 01/01/23 to 01/01/2024. Approval letter sent to scan center.  Per test claim, copay for 28 days supply is $250  Patient can fill through [facility]: [phone]   Authorization # [id number] Phone # [phone]  Enrolled patient into Dupixent copay card by calling [employer]:  BIN: [id number] PCN: [id number] Group: [id number] ID: [id number]  ATC patient regarding Dupixent new start. Unable to rech.  Chesley Mires, PharmD, MPH, BCPS, CPP Clinical Pharmacist (Rheumatology and Pulmonology)

## 2023-01-07 NOTE — Progress Notes (Signed)
HPI Patient presents today to [facility] to see pharmacy team for Dupixent new start. He has severe persistent asthma with eosinophilic phenotype.  Last ED visit was on 12/09/2022 for asthma exacerbation. He's had multiple asthma exacerbation >10 in the past year alone. Endorses frequent rescue inhaler use especially with certain activities that may be more physically demanding. He had a video visit with Dr. Celine Mans on 01/01/2023  Respiratory Medications Current regimen: Advair 500-69mcg (1 puff once daily), Spiriva (2 puffs twice daily), montelukast 10mg  nightly Patient reports no known adherence challenges  OBJECTIVE Allergies  Allergen Reactions   Amitriptyline     headaches   Silenor [Doxepin Hcl]     headaches   Tribenzor [Olmesartan-Amlodipine-Hctz]     headaches   Bupropion     Other Reaction(s): tremors   Dexmethylphenidate     Other Reaction(s): headaches   Rimegepant Sulfate     Other Reaction(s): ineffective   Topiramate     Severe headaches, memory loss    Outpatient Encounter Medications as of 01/11/2023  Medication Sig   albuterol (PROVENTIL) (2.5 MG/3ML) 0.083% nebulizer solution Take 3 mLs (2.5 mg total) by nebulization every 6 (six) hours as needed for wheezing or shortness of breath.   albuterol (VENTOLIN HFA) 108 (90 Base) MCG/ACT inhaler Inhale 2 puffs into the lungs every 4 (four) hours as needed.   alprazolam (XANAX) 2 MG tablet Take 2 mg by mouth at bedtime.   azelastine (ASTELIN) 0.1 % nasal spray Place 2 sprays into both nostrils 2 (two) times daily. Use in each nostril as directed   cetirizine (ZYRTEC) 10 MG tablet Take 10 mg by mouth daily as needed.    fenofibrate 160 MG tablet Take 160 mg by mouth daily.   fluticasone-salmeterol (ADVAIR) 500-50 MCG/ACT AEPB Inhale 1 puff into the lungs in the morning and at bedtime.   linaclotide (LINZESS) 72 MCG capsule Take 72 mcg by mouth daily before breakfast.   omeprazole (PRILOSEC) 20 MG capsule Take 20  mg by mouth in the morning and at bedtime.   oxyCODONE-acetaminophen (PERCOCET/ROXICET) 5-325 MG tablet Take 1 tablet by mouth every 8 (eight) hours as needed for severe pain.   tadalafil (CIALIS) 10 MG tablet Take 10 mg by mouth daily.   Tiotropium Bromide Monohydrate (SPIRIVA RESPIMAT) 2.5 MCG/ACT AERS Inhale 2 puffs into the lungs daily.   valsartan (DIOVAN) 160 MG tablet TAKE 1 TABLET BY MOUTH EVERY DAY (Patient taking differently: Take 320 mg by mouth daily. TAKE 1 TABLET BY MOUTH EVERY DAY)   venlafaxine XR (EFFEXOR-XR) 75 MG 24 hr capsule Take 75 mg by mouth daily with breakfast.   [DISCONTINUED] montelukast (SINGULAIR) 10 MG tablet Take 1 tablet (10 mg total) by mouth at bedtime.   montelukast (SINGULAIR) 10 MG tablet Take 1 tablet (10 mg total) by mouth at bedtime.   sodium chloride 0.9 % nebulizer solution SMARTSIG:5 Milliliter(s) Via Inhaler As Directed   traZODone (DESYREL) 150 MG tablet TAKE 1 TABLET BY MOUTH EVERYDAY AT BEDTIME (Patient taking differently: Take 300 mg by mouth at bedtime. TAKE 1 TABLET BY MOUTH EVERYDAY AT BEDTIME)   [DISCONTINUED] diclofenac (VOLTAREN) 75 MG EC tablet Take 1 tablet (75 mg total) by mouth 2 (two) times daily as needed.   [DISCONTINUED] ipratropium (ATROVENT) 0.06 % nasal spray Place 2 sprays into both nostrils 2 (two) times daily.   [DISCONTINUED] Plecanatide (TRULANCE) 3 MG TABS Take 1 tablet by mouth daily.   [DISCONTINUED] predniSONE (DELTASONE) 50 MG tablet Take 1 tablet (  50 mg total) by mouth daily.   No facility-administered encounter medications on file as of 01/11/2023.     Immunization History  Administered Date(s) Administered   Influenza Inj Mdck Quad Pf 12/30/2016, 11/30/2017   Influenza Split 11/30/2013   Influenza, Seasonal, Injecte, Preservative Fre 11/12/2022   Influenza-Unspecified 12/01/2018, 10/31/2021   Tdap 03/24/2014     PFTs    Latest Ref Rng & Units 08/26/2022    2:00 PM 10/15/2014    9:55 AM  PFT Results  FVC-Pre  L  3.83   FVC-Predicted Pre % 70  76   FVC-Post L 3.37  3.88   FVC-Predicted Post % 68  77   Pre FEV1/FVC % % 85  82   Post FEV1/FCV % % 86  88   FEV1-Pre L 2.93  3.12   FEV1-Predicted Pre % 78  80   FEV1-Post L 2.91  3.40   DLCO uncorrected ml/min/mmHg 29.25  26.46   DLCO UNC% % 103  83   DLCO corrected ml/min/mmHg 29.25    DLCO COR %Predicted % 103    DLVA Predicted % 117  124   TLC L 5.11  5.01   TLC % Predicted % 73  73   RV % Predicted % 101  72      Eosinophils Most recent blood eosinophil count was 200 cells/microL taken on 08/26/2022.   IgE: 2 on 08/26/22  Assessment   Biologics training for dupilumab (Dupixent)  Goals of therapy: Mechanism: human monoclonal IgG4 antibody that inhibits interleukin-4 and interleukin-13 cytokine-induced responses, including release of proinflammatory cytokines, chemokines, and IgE Reviewed that Dupixent is add-on medication and patient must continue maintenance inhaler regimen. Response to therapy: may take 4 months to determine efficacy. Discussed that patients generally feel improvement sooner than 4 months.  Side effects: injection site reaction (6-18%), antibody development (5-16%), ophthalmic conjunctivitis (2-16%), transient blood eosinophilia (1-2%)  Dose: 600mg  at Week 0 (administered today in clinic) followed by 300mg  every 14 days thereafter  Administration/Storage:  Reviewed administration sites of thigh or abdomen (at least 2-3 inches away from abdomen). Reviewed the upper arm is only appropriate if caregiver is administering injection  Do not shake pen/syringe as this could lead to product foaming or precipitation. Do not use if solution is discolored or contains particulate matter or if window on prefilled pen is yellow (indicates pen has been used).  Reviewed storage of medication in refrigerator. Reviewed that Dupixent can be stored at room temperature in unopened carton for up to 14 days.  Access: Approval of Dupixent  through: insurance Patient enrolled into copay card program to help with copay assistance.  Patient self-administered Dupixent 300mg /30ml x 2 (total dose 600mg ) in right upper thigh and left upper thigh using sample Dupixent 300mg /55mL autoinjector pen NDC: [phone] Lot: [id number] Expiration: 06/29/2024  Patient monitored for 30 minutes for adverse reaction.  Patient tolerated well.  Injection site noted. Patient denies itchiness and irritation at injection., No swelling or redness noted., and Reviewed injection site reaction management with patient verbally and printed information for review in AVS  Medication Reconciliation  A drug regimen assessment was performed, including review of allergies, interactions, disease-state management, dosing and immunization history. Medications were reviewed with the patient, including name, instructions, indication, goals of therapy, potential side effects, importance of adherence, and safe use.  Drug interaction(s): NONE NOTED  PLAN Continue Dupixent 300mg  every 14 days.  Next dose is due 01/25/23 and every 14 days thereafter. Rx sent to: [facility]: [phone] .  Patient provided with pharmacy phone number and advised that [facility] will call to schedule first shipment to home. Subsequent refills will be made by call center staff Patient has had change in insurance to Medicaid as of 01/01/2023 so will need to reauthorize Dupixent through new insurance. Will update CBC w diff today since Medicaid requires within past 6 weeks Continue maintenance asthma regimen of:  Advair 500-60mcg (1 puff once daily), Spiriva (2 puffs twice daily), montelukast 10mg  nightly Refill of montelukast sent to pharmacy today  All questions encouraged and answered.  Instructed patient to reach out with any further questions or concerns.  Thank you for allowing pharmacy to participate in this patient's care.  This appointment required 45 minutes of patient  care (this includes precharting, chart review, review of results, face-to-face care, etc.).   Chesley Mires, PharmD, MPH, BCPS, CPP Clinical Pharmacist (Rheumatology and Pulmonology)

## 2023-01-07 NOTE — Patient Instructions (Addendum)
Your next DUPIXENT dose is due on 01/25/2023, 02/08/2023, and every 14 days thereafter. We will need to run prior authorization through your new insurance  CONTINUE Advair 1 puff twice daily, Spiriva 2 puffs once daily, and montleulast 10mg  nightly  Your prescription will be shipped from [facility]. Their phone number is [phone]. Mills Koller will call you to schedule the first shipment to your home once approved through [place]. Pharmacy will mail your medication to your home.  You will need to be seen by your provider in 3 to 4 months to assess how DUPIXENT is working for you. Please ensure you have a follow-up appointment scheduled in February or March 2025. Call our clinic if you need to make this appointment.  Stay up to date on all routine vaccines: influenza, pneumonia, COVID19, Shingles  How to manage an injection site reaction: Remember the 5 C's: COUNTER - leave on the counter at least 30 minutes but up to overnight to bring medication to room temperature. This may help prevent stinging COLD - place something cold (like an ice gel pack or cold water bottle) on the injection site just before cleansing with alcohol. This may help reduce pain CLARITIN - use Claritin (generic name is loratadine) for the first two weeks of treatment or the day of, the day before, and the day after injecting. This will help to minimize injection site reactions CORTISONE CREAM - apply if injection site is irritated and itching CALL ME - if injection site reaction is bigger than the size of your fist, looks infected, blisters, or if you develop hives

## 2023-01-11 ENCOUNTER — Other Ambulatory Visit (HOSPITAL_COMMUNITY): Payer: Self-pay

## 2023-01-11 ENCOUNTER — Ambulatory Visit (INDEPENDENT_AMBULATORY_CARE_PROVIDER_SITE_OTHER): Payer: Medicaid Other | Admitting: Pharmacist

## 2023-01-11 ENCOUNTER — Encounter: Payer: Self-pay | Admitting: Pharmacist

## 2023-01-11 DIAGNOSIS — J4550 Severe persistent asthma, uncomplicated: Secondary | ICD-10-CM | POA: Diagnosis not present

## 2023-01-11 DIAGNOSIS — Z7189 Other specified counseling: Secondary | ICD-10-CM

## 2023-01-11 LAB — CBC WITH DIFFERENTIAL/PLATELET
Basophils Absolute: 0 10*3/uL (ref 0.0–0.1)
Basophils Relative: 0.3 % (ref 0.0–3.0)
Eosinophils Absolute: 0 10*3/uL (ref 0.0–0.7)
Eosinophils Relative: 0.6 % (ref 0.0–5.0)
HCT: 37.5 % — ABNORMAL LOW (ref 39.0–52.0)
Hemoglobin: 12.9 g/dL — ABNORMAL LOW (ref 13.0–17.0)
Lymphocytes Relative: 20 % (ref 12.0–46.0)
Lymphs Abs: 1 10*3/uL (ref 0.7–4.0)
MCHC: 34.5 g/dL (ref 30.0–36.0)
MCV: 89.9 fL (ref 78.0–100.0)
Monocytes Absolute: 0.6 10*3/uL (ref 0.1–1.0)
Monocytes Relative: 11.7 % (ref 3.0–12.0)
Neutro Abs: 3.4 10*3/uL (ref 1.4–7.7)
Neutrophils Relative %: 67.4 % (ref 43.0–77.0)
Platelets: 170 10*3/uL (ref 150.0–400.0)
RBC: 4.17 Mil/uL — ABNORMAL LOW (ref 4.22–5.81)
RDW: 13.1 % (ref 11.5–15.5)
WBC: 5.1 10*3/uL (ref 4.0–10.5)

## 2023-01-11 MED ORDER — MONTELUKAST SODIUM 10 MG PO TABS
10.0000 mg | ORAL_TABLET | Freq: Every day | ORAL | 11 refills | Status: DC
Start: 2023-01-11 — End: 2023-11-16

## 2023-01-11 NOTE — Telephone Encounter (Signed)
Patient started Dupixent today in office and stated that he changed to Medicaid as of 01/01/2023 and lost his [employer] insurance that was active last week. Please run urgent PA for Dupixent maintenance dose. Patient is updating CBC w diff today per Medicaid requirements  Chesley Mires, PharmD, MPH, BCPS, CPP Clinical Pharmacist (Rheumatology and Pulmonology)

## 2023-01-11 NOTE — Telephone Encounter (Signed)
MyChart message sent to patient for new Medicaid billing information for pharmacy team to run Dupixent prior authorization  Chesley Mires, PharmD, MPH, BCPS, CPP Clinical Pharmacist (Rheumatology and Pulmonology)

## 2023-01-12 NOTE — Telephone Encounter (Signed)
Abs eos <50. Please run Dupixent PA for steroid-dependent asthma. He has received multiple steroid taper in 2024

## 2023-01-13 ENCOUNTER — Other Ambulatory Visit (HOSPITAL_COMMUNITY): Payer: Self-pay

## 2023-01-18 ENCOUNTER — Other Ambulatory Visit (HOSPITAL_COMMUNITY): Payer: Self-pay

## 2023-01-21 NOTE — Telephone Encounter (Signed)
Submitted a Prior Authorization request to [facility] MEDICAID for DUPIXENT via CoverMyMeds. Will update once we receive a response.  Key: [id number]   Chesley Mires, PharmD, MPH, BCPS, CPP Clinical Pharmacist (Rheumatology and Pulmonology)

## 2023-01-22 ENCOUNTER — Other Ambulatory Visit: Payer: Self-pay | Admitting: Pharmacist

## 2023-01-22 MED ORDER — DUPIXENT 300 MG/2ML ~~LOC~~ SOAJ
300.0000 mg | SUBCUTANEOUS | 1 refills | Status: DC
  Filled 2023-02-03: qty 4, 28d supply, fill #0
  Filled 2023-02-17: qty 4, 28d supply, fill #1
  Filled 2023-03-23: qty 4, 28d supply, fill #2
  Filled 2023-05-03: qty 4, 28d supply, fill #3
  Filled 2023-06-02 (×2): qty 4, 28d supply, fill #4
  Filled 2023-07-13: qty 4, 28d supply, fill #5

## 2023-01-22 NOTE — Progress Notes (Signed)
Patient completed loading dose of Dupixent in clinic on 01/11/2023 and reported insurance change to Medicaid. He will need to continue Dupixent 300mg  SQ every 14 days  Continue: Advair 500-44mcg (1 puff once daily), Spiriva (2 puffs twice daily), montelukast 10mg  nightly   Chesley Mires, PharmD, MPH, BCPS, CPP Clinical Pharmacist (Rheumatology and Pulmonology)

## 2023-01-22 NOTE — Telephone Encounter (Signed)
Received notification from [name] MEDICAID regarding a prior authorization for DUPIXENT. Authorization has been APPROVED from 01/21/23 to 07/21/23. Approval letter sent to scan center.  Per test claim, copay for 28 days supply is $4  Patient can fill through [name]: [phone]   Authorization # [id number]  Rx sent to [name]. MyChart message sent to patient  Julian Oneal, PharmD, MPH, BCPS, CPP Clinical Pharmacist (Rheumatology and Pulmonology)

## 2023-02-03 ENCOUNTER — Other Ambulatory Visit: Payer: Self-pay

## 2023-02-03 NOTE — Progress Notes (Signed)
Specialty Pharmacy Initial Fill Coordination Note  Julian Oneal is a 57 y.o. male contacted today regarding initial fill of specialty medication(s) Dupilumab   Patient requested Delivery   Delivery date: 02/05/23   Verified address: [address]  [place] [address]   Medication will be filled on 02/04/23.   Patient is aware of $4 copayment. Credit card info collected and forwarded to [place] at [facility]

## 2023-02-04 ENCOUNTER — Other Ambulatory Visit (HOSPITAL_COMMUNITY): Payer: Self-pay

## 2023-02-04 ENCOUNTER — Other Ambulatory Visit: Payer: Self-pay

## 2023-02-16 ENCOUNTER — Ambulatory Visit: Payer: BC Managed Care – PPO | Admitting: Internal Medicine

## 2023-02-17 ENCOUNTER — Other Ambulatory Visit: Payer: Self-pay

## 2023-02-17 NOTE — Progress Notes (Signed)
Specialty Pharmacy Refill Coordination Note  Julian Oneal is a 57 y.o. male contacted today regarding refills of specialty medication(s) Dupilumab (Dupixent)   Patient requested Delivery   Delivery date: 03/02/23   Verified address: [address]   Medication will be filled on 03/01/23.

## 2023-03-01 ENCOUNTER — Other Ambulatory Visit: Payer: Self-pay

## 2023-03-18 ENCOUNTER — Telehealth: Payer: Self-pay | Admitting: Family Medicine

## 2023-03-18 NOTE — Telephone Encounter (Signed)
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 He is still having mobility issues.  He will be in [employer] today and would like to pick it up if possible.

## 2023-03-22 NOTE — Telephone Encounter (Signed)
Form at front desk for pick up. Pt notified.

## 2023-03-23 ENCOUNTER — Other Ambulatory Visit: Payer: Self-pay

## 2023-03-23 NOTE — Progress Notes (Signed)
Specialty Pharmacy Refill Coordination Note  Julian Oneal is a 58 y.o. male contacted today regarding refills of specialty medication(s) Dupilumab (Dupixent)   Patient requested Delivery   Delivery date: 04/02/23   Verified address: [address]   Medication will be filled on 01.30.25.

## 2023-03-29 ENCOUNTER — Telehealth (INDEPENDENT_AMBULATORY_CARE_PROVIDER_SITE_OTHER): Payer: Medicaid Other | Admitting: Nurse Practitioner

## 2023-03-29 ENCOUNTER — Telehealth: Payer: Self-pay | Admitting: Internal Medicine

## 2023-03-29 DIAGNOSIS — U071 COVID-19: Secondary | ICD-10-CM

## 2023-03-29 DIAGNOSIS — J4551 Severe persistent asthma with (acute) exacerbation: Secondary | ICD-10-CM

## 2023-03-29 MED ORDER — DOXYCYCLINE HYCLATE 100 MG PO TABS
100.0000 mg | ORAL_TABLET | Freq: Two times a day (BID) | ORAL | 0 refills | Status: AC
Start: 2023-03-29 — End: 2023-04-05

## 2023-03-29 MED ORDER — PROMETHAZINE-DM 6.25-15 MG/5ML PO SYRP
5.0000 mL | ORAL_SOLUTION | Freq: Four times a day (QID) | ORAL | 0 refills | Status: DC | PRN
Start: 2023-03-29 — End: 2023-07-07

## 2023-03-29 MED ORDER — PREDNISONE 10 MG PO TABS
ORAL_TABLET | ORAL | 0 refills | Status: DC
Start: 2023-03-29 — End: 2023-04-07

## 2023-03-29 NOTE — Telephone Encounter (Signed)
Cobb NP has an opening this evening could see for Video visit.

## 2023-03-29 NOTE — Progress Notes (Unsigned)
Patient ID: Julian Oneal, male     DOB: 1965/04/22, 58 y.o.      MRN: [id number]  Chief Complaint  Patient presents with   Follow-up    Pt states tested + for covid a week ago  has had a cough and a buildup " mucus plug"  from 11pm & 4am with buildup unable to breath.     Virtual Visit via Video Note  I connected with Julian Oneal on 03/29/23 at  3:30 PM EST by a video enabled telemedicine application and verified that I am speaking with the correct person using two identifiers.  Location: Patient: Home Provider: Office    I discussed the limitations of evaluation and management by telemedicine and the availability of in person appointments. The patient expressed understanding and agreed to proceed.  History of Present Illness: 58 year old male, former smoker followed for severe asthma on Dupixent. He is a patient of Dr. Humphrey Rolls and last seen in office 01/01/2023. Past medical history significant for HTN, OSA on CPAP, insomnia, allergic rhinitis, GERD, anxiety.   TESTS/EVENTS:  08/26/2022 PFT: FVC 70, FEV1 78, ratio 86, TLC 73, DLCO 103. No BD 08/26/2022 eos 200, allergen panel negative  12/09/2022 CTA chest: no acute process   01/01/2023: Video visit with Dr. Celine Mans. Asthma follow up. Continues to have DOE. Went to [facility] ED a few weeks ago for flare and was given prednisone. Takes albuterol neb 2-3 times a day. Uses inhaler 5-6 times a day. HTN as well - BP improved with valsartan. Start Dupixent given worsening control.   03/29/2023: Today - acute Discussed the use of AI scribe software for clinical note transcription with the patient, who gave verbal consent to proceed.  History of Present Illness   The patient, with a history of severe asthma, tested positive for COVID-19 approximately a week ago and was treated with Paxlovid. Despite this, he has been experiencing persistent cough and chest congestion, which worsens at night. The patient reports difficulty breathing,  particularly at night, and has been waking up unable to breathe. He describes a slight wheezing and has been using albuterol and a nebulizer for relief. The treatments help to some extent, but the congestion remains.   The patient is on a daily regimen of Advair and Spiriva, and has been taking Mucinex DM every twelve hours. He reports coughing up yellow and greenish sputum, but denies having fevers or chills. No hemoptysis. Eating and drinking well. The patient does not feel he is improving and there was a recent night where he felt he might need to go to the ER due to the severity of his symptoms. His sister, who is a retired Charity fundraiser, corroborates his history.  In addition to his respiratory symptoms, the patient also reports ongoing nasal congestion. He has been using Afrin for over a week and azelastine nasal spray. He also reports chest discomfort associated only with the coughing. No palpitations, dizziness/lightheadedness, PND, orthopnea. The patient denies taking any new medications and has not been on oral steroids since the previous fall or winter. He has not found the Mucinex DM to be particularly effective in relieving his symptoms.       Allergies  Allergen Reactions   Amitriptyline     headaches   Silenor [Doxepin Hcl]     headaches   Tribenzor [Olmesartan-Amlodipine-Hctz]     headaches   Bupropion     Other Reaction(s): tremors   Dexmethylphenidate     Other Reaction(s): headaches  Rimegepant Sulfate     Other Reaction(s): ineffective   Topiramate     Severe headaches, memory loss   Immunization History  Administered Date(s) Administered   Influenza Inj Mdck Quad Pf 12/30/2016, 11/30/2017   Influenza Split 11/30/2013   Influenza, Seasonal, Injecte, Preservative Fre 11/12/2022   Influenza-Unspecified 12/01/2018, 10/31/2021   Tdap 03/24/2014   Past Medical History:  Diagnosis Date   Bell's palsy 2008   Dr Anne Hahn   Dyspnea 08/28/2014   Followed in Pulmonary clinic/  [facility]/ [place]  - 08/28/2014  Walked RA x 3 laps @ 185 ft each stopped due to end of study, fast pace, no doe or desats    - trial off acei 08/28/2014 >> improved 10/15/2014  - PFT's  10/21/2014  FEV1 3.40 (87 % ) ratio 88  p 6 % improvement from saba with DLCO  83 % corrects to 124 % for alv volume      Excessive daytime sleepiness 04/04/2015   Headache(784.0)    Headaches, cluster 06/20/2013   High cholesterol    Hypertension    Insomnia    Obstructive sleep apnea 07/05/2012   Patient diagnosed with OSA and using CPAP at 6 cm water , with residual AHI of 0.8 on 07-04-12 . [facility] is his  DME .     Persistent headaches 07/05/2012    Referred [facility] Dr. Neale Burly in 2013 -     Pneumonia, viral 06/20/2013   Taste absent     Tobacco History: Social History   Tobacco Use  Smoking Status Former   Current packs/day: 0.00   Average packs/day: 0.3 packs/day for 5.0 years (1.3 ttl pk-yrs)   Types: Cigarettes   Start date: 03/02/1985   Quit date: 03/02/1990   Years since quitting: 33.0  Smokeless Tobacco Never   Counseling given: Not Answered   Outpatient Medications Prior to Visit  Medication Sig Dispense Refill   albuterol (PROVENTIL) (2.5 MG/3ML) 0.083% nebulizer solution Take 3 mLs (2.5 mg total) by nebulization every 6 (six) hours as needed for wheezing or shortness of breath. 75 mL 12   albuterol (VENTOLIN HFA) 108 (90 Base) MCG/ACT inhaler Inhale 2 puffs into the lungs every 4 (four) hours as needed. 1 each 5   alprazolam (XANAX) 2 MG tablet Take 2 mg by mouth at bedtime.     azelastine (ASTELIN) 0.1 % nasal spray Place 2 sprays into both nostrils 2 (two) times daily. Use in each nostril as directed     cetirizine (ZYRTEC) 10 MG tablet Take 10 mg by mouth daily as needed.      Dupilumab (DUPIXENT) 300 MG/2ML SOAJ Inject 300 mg into the skin every 14 (fourteen) days. **loading dose completed in clinic on 01/11/23** 12 mL 1   fenofibrate 160 MG tablet Take 160 mg  by mouth daily.     fluticasone-salmeterol (ADVAIR) 500-50 MCG/ACT AEPB Inhale 1 puff into the lungs in the morning and at bedtime. 1 each 5   linaclotide (LINZESS) 72 MCG capsule Take 72 mcg by mouth daily before breakfast.     montelukast (SINGULAIR) 10 MG tablet Take 1 tablet (10 mg total) by mouth at bedtime. 30 tablet 11   omeprazole (PRILOSEC) 20 MG capsule Take 20 mg by mouth in the morning and at bedtime.     oxyCODONE-acetaminophen (PERCOCET/ROXICET) 5-325 MG tablet Take 1 tablet by mouth every 8 (eight) hours as needed for severe pain. 15 tablet 0   sodium chloride 0.9 % nebulizer solution  SMARTSIG:5 Milliliter(s) Via Inhaler As Directed     tadalafil (CIALIS) 10 MG tablet Take 10 mg by mouth daily.  11   Tiotropium Bromide Monohydrate (SPIRIVA RESPIMAT) 2.5 MCG/ACT AERS Inhale 2 puffs into the lungs daily. 1 each 11   traZODone (DESYREL) 150 MG tablet TAKE 1 TABLET BY MOUTH EVERYDAY AT BEDTIME (Patient taking differently: Take 300 mg by mouth at bedtime. TAKE 1 TABLET BY MOUTH EVERYDAY AT BEDTIME) 30 tablet 0   valsartan (DIOVAN) 160 MG tablet TAKE 1 TABLET BY MOUTH EVERY DAY (Patient taking differently: Take 320 mg by mouth daily. TAKE 1 TABLET BY MOUTH EVERY DAY) 90 tablet 3   venlafaxine XR (EFFEXOR-XR) 75 MG 24 hr capsule Take 75 mg by mouth daily with breakfast.     No facility-administered medications prior to visit.     Review of Systems:   Constitutional: No weight loss or gain, night sweats, fevers, chills, fatigue, or lassitude. HEENT: No headaches, difficulty swallowing, tooth/dental problems, or sore throat. No sneezing, itching, ear ache +nasal congestion/drainage CV:  No chest pain, orthopnea, PND, swelling in lower extremities, anasarca, dizziness, palpitations, syncope Resp: +shortness of breath with exertion; wheezing; cough. No hemoptysis. No chest wall deformity GI:  No heartburn, indigestion, abdominal pain, nausea, vomiting, diarrhea, change in bowel habits, loss  of appetite, bloody stools.  GU: No dysuria, change in color of urine, urgency or frequency.  No flank pain, no hematuria  Skin: No rash, lesions, ulcerations MSK:  No joint pain or swelling.   Neuro: No dizziness or lightheadedness.  Psych: No depression or anxiety. Mood stable.   Observations/Objective: Patient is well-developed, well-nourished in no acute distress.  Resting comfortably at home.  No labored breathing.  Speech is clear and coherent with logical content. Able to speak in complete sentences without difficulty Patient is alert and oriented at baseline.   Assessment and Plan:    COVID-19 with Asthma Exacerbation Tested positive for COVID-19 a week ago. Experiencing worsening cough, chest congestion, and dyspnea, especially nocturnally. Severe asthma, currently on Advair, Spiriva, Dupixent and Singulair. Partial relief with albuterol nebulizer and inhaler. Symptoms include yellow-green sputum, nasal congestion, and chest discomfort. High risk for decompensation. VS stable today. Able to speak in complete sentences without difficulty. Does not appear to have labored breathing during visit. Plan includes steroids and antibiotics for potential bacterial co-infection and inflammation. Discussed doxycycline risks including photosensitivity and gastrointestinal upset. Advised to monitor for worsening symptoms and seek emergency care if severe respiratory distress or hypoxemia occurs. - Prescribe doxycycline 100 mg BID for 7 days - Prescribe prednisone taper: 40 mg for 2 days, 30 mg for 2 days, 20 mg for 2 days, 10 mg for 2 days - Continue albuterol nebulizer QID - Use mucinex or mucinex DM bid. Advised to not use mucinex DM with prescribed cough syrup - Discontinue Afrin, start Flonase nasal spray, 2 sprays each nostril daily - Prescribe promethazine DM cough syrup for cough management. Side effect profile reviewed.  - Advise sunscreen and protective clothing while on doxycycline -  Continue asthma control regimen  - Follow-up in 2 weeks with Dr. Celine Mans  Nasal Congestion Ongoing nasal congestion, using Afrin for over a week. Also using azelastine nasal spray. Prolonged Afrin use can cause rebound rhinitis. Advised to discontinue. Can use saline nasal rinses 1-2 times a day.  - Discontinue Afrin - Start Flonase nasal spray, 2 sprays each nostril daily - Continue azelastine nasal spray, 2 sprays each nostril daily    Follow  up - Follow up with Dr. Celine Mans 2/10 as scheduled  - ED precautions if symptoms worsen        I discussed the assessment and treatment plan with the patient. The patient was provided an opportunity to ask questions and all were answered. The patient agreed with the plan and demonstrated an understanding of the instructions.   The patient was advised to call back or seek an in-person evaluation if the symptoms worsen or if the condition fails to improve as anticipated.  I provided 35 minutes of non-face-to-face time during this encounter.   Noemi Chapel, NP

## 2023-03-29 NOTE — Telephone Encounter (Signed)
Patient tested positive for covid over a week ago but is having a cough that started before the covid. His cough causes him to have a hard time breathing and sometimes his airways close as well. Please call and advise. [phone]

## 2023-03-29 NOTE — Patient Instructions (Addendum)
Continue Albuterol inhaler 2 puffs or 3 mL neb every 6 hours as needed for shortness of breath or wheezing. Notify if symptoms persist despite rescue inhaler/neb use. Use nebs 3-4 times a day until symptoms improve Continue Advair 1 puff Twice daily. Brush tongue and rinse mouth afterwards Continue Spiriva 2 puffs daily Continue astelin nasal spray 2 sprays each nostril Twice daily  Continue singulair 1 tab At bedtime  Continue zyrtec 1 tab daily Continue dupixent injections as scheduled  -Prednisone taper. 4 tabs for 2 days, then 3 tabs for 2 days, 2 tabs for 2 days, then 1 tab for 2 days, then stop. Take in AM with food -Doxycycline 1 tab Twice daily for 7 days. Take with food. Wear sunscreen/use sun protection as this can increase risk for sunburns -Promethazine DM cough syrup 5 mL every 6 hours as needed for cough. May cause drowsiness. Do not drive after taking  -Guaifenesin [phone] mg Twice daily for congestion. You can use the DM if you don't use the cough syrup I gave you  If your symptoms rapidly worsen, you have trouble talking or extreme difficulty breathing, or you're not getting relief from your nebulizer/rescue, go to the emergency department.  Follow up with Dr. Celine Mans 2/10 as scheduled. If symptoms do not improve or worsen, please contact office for sooner follow up or seek emergency care.

## 2023-03-31 ENCOUNTER — Encounter: Payer: Self-pay | Admitting: Nurse Practitioner

## 2023-04-05 ENCOUNTER — Ambulatory Visit: Payer: Medicaid Other | Attending: Sports Medicine | Admitting: Occupational Therapy

## 2023-04-05 ENCOUNTER — Encounter: Payer: Self-pay | Admitting: Occupational Therapy

## 2023-04-05 DIAGNOSIS — M25631 Stiffness of right wrist, not elsewhere classified: Secondary | ICD-10-CM | POA: Diagnosis present

## 2023-04-05 DIAGNOSIS — M25521 Pain in right elbow: Secondary | ICD-10-CM | POA: Diagnosis present

## 2023-04-05 DIAGNOSIS — M25531 Pain in right wrist: Secondary | ICD-10-CM

## 2023-04-05 DIAGNOSIS — M6281 Muscle weakness (generalized): Secondary | ICD-10-CM

## 2023-04-05 NOTE — Therapy (Signed)
OUTPATIENT OCCUPATIONAL THERAPY ORTHO EVALUATION  Patient Name: Julian Oneal MRN: [id number] DOB:Dec 22, 1965, 58 y.o., male Today's Date: 04/05/2023  PCP: Dr Hendricks Limes PROVIDER: Dr Freida Busman  END OF SESSION:  OT End of Session - 04/05/23 1331     Visit Number 1    Number of Visits 12    Date for OT Re-Evaluation 05/31/23    OT Start Time 0850    OT Stop Time 0935    OT Time Calculation (min) 45 min    Activity Tolerance Patient tolerated treatment well    Behavior During Therapy [facility] for tasks assessed/performed             Past Medical History:  Diagnosis Date   Bell's palsy 2008   Dr Anne Hahn   Dyspnea 08/28/2014   Followed in Pulmonary clinic/  [facility] [place]  - 08/28/2014  Walked RA x 3 laps @ 185 ft each stopped due to end of study, fast pace, no doe or desats    - trial off acei 08/28/2014 >> improved 10/15/2014  - PFT's  10/21/2014  FEV1 3.40 (87 % ) ratio 88  p 6 % improvement from saba with DLCO  83 % corrects to 124 % for alv volume      Excessive daytime sleepiness 04/04/2015   Headache(784.0)    Headaches, cluster 06/20/2013   High cholesterol    Hypertension    Insomnia    Obstructive sleep apnea 07/05/2012   Patient diagnosed with OSA and using CPAP at 6 cm water , with residual AHI of 0.8 on 07-04-12 . [employer] is his  DME .     Persistent headaches 07/05/2012    Referred to [facility] Dr. Neale Burly in 2013 -     Pneumonia, viral 06/20/2013   Taste absent    Past Surgical History:  Procedure Laterality Date   CHOLECYSTECTOMY  2003   FOOT SURGERY  2008   HERNIA REPAIR  [id number]   MOUTH SURGERY     Patient Active Problem List   Diagnosis Date Noted   Excessive daytime sleepiness 04/04/2015   OSA on CPAP 04/04/2015   Essential hypertension 08/29/2014   Dyspnea 08/28/2014   Pneumonia, viral 06/20/2013   Headaches, cluster 06/20/2013   Sleep apnea with use of continuous positive airway pressure (CPAP) 08/02/2012   Insomnia, persistent  07/05/2012   Obstructive sleep apnea 07/05/2012   Persistent headaches 07/05/2012    ONSET DATE: 6 months  REFERRING DIAG: R wrist ECU tendinitis , elbow pain and stiffness   THERAPY DIAG:  Pain in right wrist  Pain in right elbow  Stiffness of right wrist, not elsewhere classified  Muscle weakness (generalized)  Rationale for Evaluation and Treatment: Rehabilitation  SUBJECTIVE:   SUBJECTIVE STATEMENT: My symptoms is on and off for the last 2 years since my fall off the 12 feet ladder,-I I needed to use a cane in the last 6 months and helped take care of my mom that passed away 04-30-23.  So I had increased wrist pain and then elbow pain too.  I fell on my right side on my shoulder ,elbow wrist and knee. Pt accompanied by: self  PERTINENT HISTORY: Patient seen Dr. Freida Busman 03/25/2023 with increased right wrist and elbow pain.  Has been increased since using a cane for the right knee.  Increased popping and stiffness in the right knee as well as wrist.  Also caretaker for his mom does have to help with transfers.  Referred to OT.  Patient was fitted with a soft brace for nighttime for the wrist and can wear a sleeve on the elbow.  PRECAUTIONS: None     WEIGHT BEARING RESTRICTIONS: No  PAIN:  Are you having pain?  Pain at rest 2/10 at right wrist and at the most 10/10 with use.  Lateral epicondyle pain at rest 1/10 and increased to a 7/10  FALLS: Has patient fallen in last 6 months?  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  UPPER EXTREMITY ROM:     Active ROM Right eval Left eval  Shoulder flexion    Shoulder abduction    Shoulder adduction    Shoulder extension    Shoulder internal rotation    Shoulder external rotation    Elbow flexion    Elbow extension    Wrist flexion 75 pull at elbow    Wrist extension 40 pain elbow   Wrist ulnar deviation 25 pain wrist 35  Wrist radial deviation 25 25  Wrist pronation 90 pain elbow   Wrist supination 90   (Blank rows = not tested)  Active ROM Right eval Left eval  Thumb MCP (0-60)    Thumb IP (0-80)    Thumb Radial abd/add (0-55)     Thumb Palmar abd/add (0-45)     Thumb Opposition to Small Finger     Index MCP (0-90)     Index PIP (0-100)     Index DIP (0-70)      Long MCP (0-90)      Long PIP (0-100)      Long DIP (0-70)      Ring MCP (0-90)      Ring PIP (0-100)      Ring DIP (0-70)      Little MCP (0-90)      Little PIP (0-100)      Little DIP (0-70)      Fisting and opposition WNL - no pain -and thumb AROM WNL - no pain   UPPER EXTREMITY MMT:   Pain at left upper condyle with resistance to wrist extension and third digit extension Tenderness over lateral epicondyle  HAND FUNCTION: Grip strength: Right: 66 pain elbow ( 72 ext arm) lbs; Left: 87 and 90 ext arm  lbs, Lateral pinch: Right: 15 lbs, Left: 21 lbs, and 3 point pinch: Right: 9 lbs, Left: 12 lbs  COORDINATION: WNL  SENSATION: Denies any sensory issues  EDEMA: None noticed  COGNITION: Overall cognitive status: Within functional limits for tasks assessed      TREATMENT DATE: 04/05/23  Modalities: Heat   Time: 6 min  Location: Right elbow Prior to review of cross friction massage as well as forearm extensor stretch in neutral and  pronation 5 reps each hold 5 seconds  Done [place] on volar forearm with sweeping #2 -wrist extension increased to 45 degrees  Patient to do at home heat followed by cross friction massage and light to stretches hold 5 seconds 5 reps with elbow to side 2 times a day For ice massage. Patient was educated in wearing counterforce strap correctly during the day with activities to decrease pain Also reviewed with patient modifications on grasping to decrease pain at lateral epicondyle.       PATIENT EDUCATION: Education details: findings of eval and HEP  Person educated: Patient Education method: Explanation, Demonstration, Tactile cues, Verbal cues, and Handouts Education comprehension: verbalized understanding, returned demonstration, verbal cues required, and needs further education    GOALS: Goals reviewed with patient? Yes  SHORT TERM GOALS: Target date: 2 wks   Patient to be independent in a home program to wear corrective braces, and modify activities to decrease pain in right elbow and wrist Baseline: Pain at right wrist 1-10/10 with use and right elbow pain can increase to 6-7/10.  Patient not wearing anything on the elbow and sometimes at the wrist Goal status: INITIAL  LONG TERM GOALS: Target date: 8 wks  Right wrist active range of motion increased to within normal limits symptom-free. Baseline: Pain with wrist flexion and extension as well as ulnar deviation and pronation.  Wrist extension 40 flexion 75.  With ulnar wrist pain as well as lateral condyle pain Goal status: INITIAL  2.  Pain in right elbow decreased to less than 2/10 with active range of motion within normal limits to initiate strengthening. Baseline: Pain in the lateral condyle well as tenderness 6-7/10 Goal status: INITIAL  3.  Patient can tolerate strengthening to right upper arm for 30 minutes without increase symptoms Baseline: Pain with active range of motion wrist extension and pronation and ulnar  deviation, tenderness 6-7/10 Goal status: INITIAL  4.  Patient verbalize and implement 3 modifications to work and home activities to decrease pain in right elbow and wrist to less than 2/10 Baseline: Has no knowledge pain can increase at the elbow 6-7/10 and the wrist 1-10/10 Goal status: INITIAL   ASSESSMENT:  CLINICAL IMPRESSION: Patient seen today for occupational therapy evaluation for right wrist and elbow pain.  Patient is right-hand dominant.  Patient had a fall to 2 years ago that injured right side of body.  But the last 6 months right wrist and elbow pain increased after using a cane as well as being a caretaker for his mom helping with transfers.  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  Pain at the wrist can be from a 0-10/10 on the ulnar side.  Elbow pain patient present with tenderness over the left upper condyle with a positive test for resistance to wrist extension and third digit extension.  Pain increases to 6-7/10 with gripping, twisting and wrist extension and pronation.  Patient with decreased grip and prehension strength compared to the left hand.  Patient limited in functional use of right dominant hand in ADLs and IADLs by increased pain decreased range of motion and strength in right dominant hand.  Patient can benefit from skilled OT services to decrease pain at right elbow and wrist, increase motion and increase strength to return to prior level of  function.  PERFORMANCE DEFICITS: in functional skills including ADLs, IADLs, ROM, strength, pain, flexibility, decreased knowledge of use of DME, and UE functional use,   and psychosocial skills including environmental adaptation and routines and behaviors.   IMPAIRMENTS: are limiting patient from ADLs, IADLs, rest and sleep, play, leisure, and social participation.   COMORBIDITIES: has no other co-morbidities that affects occupational performance. Patient will benefit from skilled  OT to address above impairments and improve overall function.  MODIFICATION OR ASSISTANCE TO COMPLETE EVALUATION: No modification of tasks or assist necessary to complete an evaluation.  OT OCCUPATIONAL PROFILE AND HISTORY: Problem focused assessment: Including review of records relating to presenting problem.  CLINICAL DECISION MAKING: LOW - limited treatment options, no task modification necessary  REHAB POTENTIAL: Good for goals  EVALUATION COMPLEXITY: Low    PLAN:  OT FREQUENCY: 1-2x/week depending on progres  OT DURATION: 8 wks  PLANNED INTERVENTIONS: 97168 OT Re-evaluation, 97535 self care/ADL training, 96295 therapeutic exercise, 97530 therapeutic activity, 97140 manual therapy, 97035 ultrasound, 97018 paraffin, 28413 fluidotherapy, 97034 contrast bath, 97033 iontophoresis, 97760 Orthotics management and training, passive range of motion, patient/family education, and DME and/or AE instructions    CONSULTED AND AGREED WITH PLAN OF CARE: Patient     Oletta Cohn, OTR/L,CLT 04/05/2023, 1:33 PM

## 2023-04-07 ENCOUNTER — Telehealth: Payer: Self-pay | Admitting: Internal Medicine

## 2023-04-07 DIAGNOSIS — J4551 Severe persistent asthma with (acute) exacerbation: Secondary | ICD-10-CM

## 2023-04-07 MED ORDER — PREDNISONE 10 MG PO TABS
ORAL_TABLET | ORAL | 0 refills | Status: DC
Start: 2023-04-07 — End: 2023-07-07

## 2023-04-07 NOTE — Telephone Encounter (Signed)
 Restart prednisone  - 40 mg for 4 days, 30 mg for 4 days, 20 mg for 4 days then 10 mg for 4 days then stop. Take in AM with food. He had said that the benzonatate  hasn't worked for him in the past. He can use OTC Robitussin DM or Mucinex DM instead of the promethazine  DM. If symptoms worsen or fail to improve before his f/u with Dr. Meade, needs to go to UC/ED for imaging and in person evaluation.

## 2023-04-07 NOTE — Telephone Encounter (Signed)
 Spoke to patient.  He stated that he completed course of doxy and prednisone  that was prescribed on 03/29/2023. Sx improved with tx but have seems to worsen over the past 2 days.  C/o chest tightness, increased SOB, chest congestion and prod cough with yellow sputum.  Denied f/c/s or additional sx.  He is using albuterol  HFA 5-6x daily, spiriva  once daily. He did not pick up promethazine , due to this medication not working for him in the past.  Post for covid three weeks ago.  Beth, please advise. Thanks

## 2023-04-07 NOTE — Telephone Encounter (Signed)
Sending to Florentina Addison whom saw patient on 1/27

## 2023-04-07 NOTE — Telephone Encounter (Signed)
 Pt is aware of below message/recommendations and voiced his understanding.  Nothing further needed.

## 2023-04-07 NOTE — Telephone Encounter (Signed)
Patient has chest congestion and a runny nose. He finished taking his medications from when he had covid but is starting to experience symptoms again. He would like for more medication to be called in.  Pharmacy:[facility] in [place]

## 2023-04-08 ENCOUNTER — Ambulatory Visit: Payer: Medicaid Other | Admitting: Occupational Therapy

## 2023-04-08 DIAGNOSIS — M25521 Pain in right elbow: Secondary | ICD-10-CM

## 2023-04-08 DIAGNOSIS — M6281 Muscle weakness (generalized): Secondary | ICD-10-CM

## 2023-04-08 DIAGNOSIS — M25631 Stiffness of right wrist, not elsewhere classified: Secondary | ICD-10-CM

## 2023-04-08 DIAGNOSIS — M25531 Pain in right wrist: Secondary | ICD-10-CM

## 2023-04-08 NOTE — Therapy (Signed)
 OUTPATIENT OCCUPATIONAL THERAPY ORTHO TREATMENT  Patient Name: Julian Oneal MRN: [id number] DOB:[birth year], 58 y.o., male Today's Date: 04/08/2023  PCP: Dr Maree MART PROVIDER: Dr Dasie  END OF SESSION:  OT End of Session - 04/08/23 0904     Visit Number 2    Number of Visits 12    Date for OT Re-Evaluation 05/31/23    OT Start Time 0904    OT Stop Time 0955    OT Time Calculation (min) 51 min    Activity Tolerance Patient tolerated treatment well    Behavior During Therapy [facility] for tasks assessed/performed             Past Medical History:  Diagnosis Date   Bell's palsy 2008   Dr Jenel   Dyspnea 08/28/2014   Followed in Pulmonary clinic/ [facility]/ [facility]  - 08/28/2014  Walked RA x 3 laps @ 185 ft each stopped due to end of study, fast pace, no doe or desats    - trial off acei 08/28/2014 >> improved 10/15/2014  - PFT's  10/21/2014  FEV1 3.40 (87 % ) ratio 88  p 6 % improvement from saba with DLCO  83 % corrects to 124 % for alv volume      Excessive daytime sleepiness 04/04/2015   Headache(784.0)    Headaches, cluster 06/20/2013   High cholesterol    Hypertension    Insomnia    Obstructive sleep apnea 07/05/2012   Patient diagnosed with OSA and using CPAP at 6 cm water , with residual AHI of 0.8 on 07-04-12 . [facility] is his  DME .     Persistent headaches 07/05/2012    [facility] Dr. Oneita in 2013 -     Pneumonia, viral 06/20/2013   Taste absent    Past Surgical History:  Procedure Laterality Date   CHOLECYSTECTOMY  2003   FOOT SURGERY  2008   HERNIA REPAIR  [id number]   MOUTH SURGERY     Patient Active Problem List   Diagnosis Date Noted   Excessive daytime sleepiness 04/04/2015   OSA on CPAP 04/04/2015   Essential hypertension 08/29/2014   Dyspnea 08/28/2014   Pneumonia, viral 06/20/2013   Headaches, cluster 06/20/2013   Sleep apnea with use of continuous positive airway pressure (CPAP) 08/02/2012   Insomnia, persistent  07/05/2012   Obstructive sleep apnea 07/05/2012   Persistent headaches 07/05/2012    ONSET DATE: 6 months  REFERRING DIAG: R wrist ECU tendinitis , elbow pain and stiffness   THERAPY DIAG:  Pain in right wrist  Pain in right elbow  Stiffness of right wrist, not elsewhere classified  Muscle weakness (generalized)  Rationale for Evaluation and Treatment: Rehabilitation  SUBJECTIVE:   SUBJECTIVE STATEMENT: Did not had as much time to do my HEP - had my mom's funeral -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 Pt accompanied by: self  PERTINENT HISTORY: Patient seen Dr. Dasie 03/25/2023 with increased right wrist and elbow pain.  Has been increased since using a cane for the right knee.  Increased popping and stiffness in the right knee as well as wrist.  Also caretaker for his mom does have to help with transfers.  Referred to OT.  Patient was fitted with a soft brace for nighttime for the wrist and can wear a sleeve on the elbow.  PRECAUTIONS: None     WEIGHT BEARING RESTRICTIONS: No  PAIN:  Are you having pain?  Pain at rest 2/10 at right wrist and at the most 10/10 with use.  Lateral epicondyle pain at rest 1/10 and increased to a 7/10  FALLS: Has patient fallen in last 6 months?  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  UPPER EXTREMITY ROM:     Active ROM Right eval Left eval  R  04/08/23  Shoulder flexion     Shoulder abduction     Shoulder adduction     Shoulder extension     Shoulder internal rotation     Shoulder external rotation     Elbow flexion     Elbow extension     Wrist flexion 75 pull at elbow   75 pull at elbow  Wrist extension 40 pain elbow  45 elbow   Wrist ulnar deviation 25 pain wrist 35 35 elbow  Wrist radial deviation 25 25 25   Wrist pronation 90 pain elbow  90  Wrist supination 90  90 pain elbow  (Blank rows = not tested)  Active ROM Right eval Left eval  Thumb MCP (0-60)    Thumb IP (0-80)    Thumb Radial abd/add (0-55)     Thumb Palmar abd/add (0-45)     Thumb Opposition to Small Finger     Index MCP (0-90)     Index PIP (0-100)     Index DIP (0-70)      Long MCP (0-90)      Long PIP (0-100)      Long DIP (0-70)      Ring MCP (0-90)      Ring PIP (0-100)      Ring DIP (0-70)      Little MCP (0-90)      Little PIP (0-100)      Little DIP (0-70)      Fisting and opposition WNL - no pain -and thumb AROM WNL - no pain   UPPER EXTREMITY MMT:   Pain at left upper condyle with resistance to wrist extension and third digit extension Tenderness over lateral epicondyle  HAND FUNCTION: Grip strength: Right: 66 pain elbow ( 72 ext arm) lbs; Left: 87 and 90 ext arm  lbs, Lateral pinch: Right: 15 lbs, Left: 21 lbs, and 3 point pinch: Right: 9 lbs, Left: 12 lbs  COORDINATION: WNL  SENSATION: Denies any sensory issues  EDEMA: None noticed  COGNITION: Overall cognitive status: Within functional limits for tasks assessed      TREATMENT DATE: 04/08/23  Modalities: Heat  to elbow  and paraffin wrist and hand  Time: 8 min  Location: Right elbow and wrist/hand Prior soft tissue and  forearm stretches ito decrease pain and stiffness  Done [place] on volar forearm/ extensors  with sweeping #2 - pt to do  some soft tissue massage to flexors at home with L hand -and cross friction to lat epiconydyle- prior to stretches- show increase motion   REview and changes stretches to elbow at side with loose fist in neutral and pronation 5 reps hold 5 seconds each As well as add gentle forearm flexor stretches using left hand at palm for a gentle stretch on volar wrist.  5 x 5 reps STOP prior to feeling any symptoms at lateral epicondyle  Patient to do at home heat followed by cross friction massage/ soft tissue  and light to stretches hold 5 seconds 5 reps with elbow to side  Patient was educated in wearing counterforce strap correctly during the day with activities to decrease pain Info provided where to get it at a good price. Reinforced again  with patient modifications on grasping to decrease pain at lateral epicondyle.  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       PATIENT EDUCATION: Education details: findings of eval and HEP  Person educated: Patient Education method: Explanation, Demonstration, Tactile cues, Verbal cues, and Handouts Education comprehension: verbalized understanding, returned demonstration, verbal cues required, and needs further education    GOALS: Goals reviewed with patient? Yes  SHORT TERM GOALS: Target date: 2 wks   Patient to be independent in a home program to wear corrective braces, and modify activities to decrease pain in right elbow and wrist Baseline: Pain at right wrist 1-10/10 with use and right elbow pain can increase to 6-7/10.  Patient not wearing anything on the elbow and sometimes at the wrist Goal status: INITIAL  LONG TERM GOALS: Target date: 8 wks  Right wrist active range of motion increased to within normal limits symptom-free. Baseline: Pain with wrist flexion and  extension as well as ulnar deviation and pronation.  Wrist extension 40 flexion 75.  With ulnar wrist pain as well as lateral condyle pain Goal status: INITIAL  2.  Pain in right elbow decreased to less than 2/10 with active range of motion within normal limits to initiate strengthening. Baseline: Pain in the lateral condyle well as tenderness 6-7/10 Goal status: INITIAL  3.  Patient can tolerate strengthening to right upper arm for 30 minutes without increase symptoms Baseline: Pain with active range of motion wrist extension and pronation and ulnar deviation, tenderness 6-7/10 Goal status: INITIAL  4.  Patient verbalize and implement 3 modifications to work and home activities to decrease pain in right elbow and wrist to less than 2/10 Baseline: Has no knowledge pain can increase at the elbow 6-7/10 and the wrist 1-10/10 Goal status: INITIAL   ASSESSMENT:  CLINICAL IMPRESSION: Patient seen today for occupational therapy evaluation for right wrist and elbow pain.  Patient is right-hand dominant.  Patient had a fall to 2 years ago that injured right side of body.  But the last 6 months right wrist and elbow pain increased after using a cane as well as being a caretaker for his mom helping with transfers.  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  Pain at the wrist can be from a 0-10/10 on the ulnar side.  Elbow pain patient present with tenderness over the left upper condyle with a positive test for resistance to wrist extension and third digit extension.  Pain increases to 6-7/10 with gripping, twisting and wrist extension and pronation.  Patient with decreased grip and prehension strength compared to the left hand.  NOW patient had a funeral of mom since last time progress but.  Appear wrist flexion extension 5 degrees better.  Patient limited in functional use of right dominant hand in ADLs and IADLs by increased pain decreased range of motion and  strength in right dominant hand.  Patient can benefit from skilled OT services to decrease pain at right elbow and wrist, increase motion and increase strength to return to prior level of function.  PERFORMANCE DEFICITS: in functional skills including ADLs, IADLs, ROM, strength, pain, flexibility, decreased knowledge of use of DME, and UE functional use,   and psychosocial skills including environmental adaptation and routines and behaviors.   IMPAIRMENTS: are limiting patient from ADLs, IADLs, rest and sleep, play, leisure, and social participation.   COMORBIDITIES: has no other co-morbidities that affects occupational performance. Patient will benefit from skilled OT to address above impairments and improve overall function.  MODIFICATION OR ASSISTANCE TO COMPLETE EVALUATION: No modification of tasks or assist necessary to complete an evaluation.  OT OCCUPATIONAL PROFILE AND HISTORY: Problem focused assessment: Including review of records relating to presenting problem.  CLINICAL DECISION MAKING: LOW - limited treatment options, no task modification necessary  REHAB POTENTIAL: Good for goals  EVALUATION COMPLEXITY: Low    PLAN:  OT FREQUENCY: 1-2x/week depending on progres  OT DURATION: 8 wks  PLANNED INTERVENTIONS: 97168 OT Re-evaluation, 97535 self care/ADL training, 02889 therapeutic exercise, 97530 therapeutic activity, 97140 manual therapy, 97035 ultrasound, 97018 paraffin, 02960 fluidotherapy, 97034 contrast bath, 97033 iontophoresis, 97760 Orthotics management and training, passive range of motion, patient/family education, and DME and/or AE instructions    CONSULTED AND AGREED WITH PLAN OF CARE: Patient     Julian Oneal, OTR/L,CLT 04/08/2023, 9:47 AM

## 2023-04-12 ENCOUNTER — Ambulatory Visit (INDEPENDENT_AMBULATORY_CARE_PROVIDER_SITE_OTHER): Payer: Medicaid Other | Admitting: Internal Medicine

## 2023-04-12 ENCOUNTER — Encounter: Payer: Self-pay | Admitting: Internal Medicine

## 2023-04-12 VITALS — BP 134/87 | HR 96 | Resp 16 | Ht 70.0 in | Wt 169.8 lb

## 2023-04-12 DIAGNOSIS — J4550 Severe persistent asthma, uncomplicated: Secondary | ICD-10-CM

## 2023-04-12 DIAGNOSIS — J309 Allergic rhinitis, unspecified: Secondary | ICD-10-CM | POA: Diagnosis not present

## 2023-04-12 DIAGNOSIS — R490 Dysphonia: Secondary | ICD-10-CM

## 2023-04-12 DIAGNOSIS — K219 Gastro-esophageal reflux disease without esophagitis: Secondary | ICD-10-CM

## 2023-04-12 NOTE — Progress Notes (Signed)
The patient has been prescribed the inhaler albuterol with spacer. Inhaler technique was demonstrated to patient. The patient subsequently demonstrated correct technique.

## 2023-04-12 NOTE — Patient Instructions (Addendum)
 It was a pleasure to see you today!  Please schedule follow up scheduled with myself in 3 months.  If my schedule is not open yet, we will contact you with a reminder closer to that time. Please call [phone] if you haven't heard from us  a month before, and always call us  sooner if issues or concerns arise. You can also send us  a message through MyChart, but but aware that this is not to be used for urgent issues and it may take up to 5-7 days to receive a reply. Please be aware that you will likely be able to view your results before I have a chance to respond to them. Please give us  5 business days to respond to any non-urgent results.   Before your next visit I would like you to have: Referral to ENT I don't think all your symptoms are related to asthma and want you to be seen for your hoarse voice and difficulty getting a deep breath in.   Use albuterol  with spacer as we discussed.   Continue advair at high dose 1 puff twice daily Continue spiriva  inhaler 2 puffs once daily.  Continue albuterol  either inhaler or nebulized as needed up to 6 times/day.   Contniue acid reflux medication Resume astelin, cetirizine , montelukast  for allergies and asthma  Continue dupixent  injections.

## 2023-04-12 NOTE — Progress Notes (Signed)
 Julian Oneal    [id number]    [birth year]  Primary Care Physician:Shaw, Jerlean Mood, MD Date of Appointment: 04/12/2023 Established Patient Visit  Chief complaint:   Chief Complaint  Patient presents with   Follow-up    Had covid 4 weeks ago. Feeling better with prednisone  on board. Mother passed away last week.     HPI: Julian Oneal is a 58 y.o. 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 Asthma Symptoms started around 06/01/2001. Started Dupixent  November 2024  Interval Updates: Here for follow up. Had Sick visit with Canary Ceo. Tested positive for Covid. Given prednisone  and then called back for a repeat taper. He was taking albuterol  12 times a day and is now down to 5-6 times/day.   He feels like he has trouble getting a deep breath in rather than exhaling. With minimal exertion such as walking through the parking lot.   Current Regimen: 500 advair 1 puff twice daily and spiriva  once daily, dupixent   Asthma Triggers: URI, seasonal environmental allergies, exertion.  Exacerbations in the last year: 6-7 times/last 12 months. Last in October 2024 History of hospitalization or intubation: never intubated Allergy  Testing: never had GERD: yes on PPI, controlled Allergic Rhinitis: zyrtec , singulair , astelin (not currently taking.) ACT:  Asthma Control Test ACT Total Score  11/12/2022  8:38 AM 6  05/26/2022  2:20 PM 9   FeNO: 15 ppb  I have reviewed the patient's family social and past medical history and updated as appropriate.   Past Medical History:  Diagnosis Date   Bell's palsy 2008   Dr Tilda Fogo   Dyspnea 08/28/2014   Followed in Pulmonary clinic/ [facility]/ [facility]  - 08/28/2014  Walked RA x 3 laps @ 185 ft each stopped due to end of study, fast pace, no doe or desats    - trial off acei 08/28/2014 >> improved 10/15/2014  - PFT's  10/21/2014  FEV1 3.40 (87 % ) ratio 88  p 6 % improvement from saba with DLCO  83 %  corrects to 124 % for alv volume      Excessive daytime sleepiness 04/04/2015   Headache(784.0)    Headaches, cluster 06/20/2013   High cholesterol    Hypertension    Insomnia    Obstructive sleep apnea 07/05/2012   Patient diagnosed with OSA and using CPAP at 6 cm water , with residual AHI of 0.8 on 07-04-12 . [facility] is his  DME .     Persistent headaches 07/05/2012    Referred to [facility] Dr. Margie Sheller in 2013 -     Pneumonia, viral 06/20/2013   Taste absent     Past Surgical History:  Procedure Laterality Date   CHOLECYSTECTOMY  2003   FOOT SURGERY  2008   HERNIA REPAIR  [id number]   MOUTH SURGERY      Family History  Problem Relation Age of Onset   Prostate cancer Father    Cancer - Prostate Father    Allergies Sister    Heart disease Maternal Grandmother    Cancer - Prostate Maternal Grandfather    Prostate cancer Maternal Grandfather    COPD Maternal Grandfather    Heart disease Paternal Grandmother    Parkinson's disease Maternal Uncle    Alzheimer's disease Paternal Aunt        in their late 86's   Alzheimer's disease Paternal Uncle  in their late 20's    Social History   Occupational History   Occupation: fireman    Comment: Neurosurgeon: [employer]  Tobacco Use   Smoking status: Former    Current packs/day: 0.00    Average packs/day: 0.3 packs/day for 5.0 years (1.3 ttl pk-yrs)    Types: Cigarettes    Start date: 03/02/1985    Quit date: 03/02/1990    Years since quitting: 33.1   Smokeless tobacco: Never  Vaping Use   Vaping status: Never Used  Substance and Sexual Activity   Alcohol use: Not Currently    Comment: occasionally   Drug use: No   Sexual activity: Not on file     Physical Exam: Blood pressure 134/87, pulse 96, resp. rate 16, height 5\' 10"  (1.778 m), weight 169 lb 12.8 oz (77 kg), SpO2 96%.   Gen: fatigued, no distress Resp: ctab no wheeze CV: RRR no  edema   Data Reviewed: Imaging: I have personally reviewed the chest xray Dec 2023 shows hazy interstitial opacities.   PFTs: PFTs reviewed - normal pulmonary function 2024    Latest Ref Rng & Units 08/26/2022    2:00 PM 10/15/2014    9:55 AM  PFT Results  FVC-Pre L  3.83   FVC-Predicted Pre % 70  76   FVC-Post L 3.37  3.88   FVC-Predicted Post % 68  77   Pre FEV1/FVC % % 85  82   Post FEV1/FCV % % 86  88   FEV1-Pre L 2.93  3.12   FEV1-Predicted Pre % 78  80   FEV1-Post L 2.91  3.40   DLCO uncorrected ml/min/mmHg 29.25  26.46   DLCO UNC% % 103  83   DLCO corrected ml/min/mmHg 29.25    DLCO COR %Predicted % 103    DLVA Predicted % 117  124   TLC L 5.11  5.01   TLC % Predicted % 73  73   RV % Predicted % 101  72     Labs: Lab Results  Component Value Date   NA 139 12/09/2022   K 4.1 12/09/2022   CO2 29 12/09/2022   GLUCOSE 86 12/09/2022   BUN 14 12/09/2022   CREATININE 1.01 12/09/2022   CALCIUM 9.9 12/09/2022   GFRNONAA >60 12/09/2022   Lab Results  Component Value Date   WBC 5.1 01/11/2023   HGB 12.9 (L) 01/11/2023   HCT 37.5 (L) 01/11/2023   MCV 89.9 01/11/2023   PLT 170.0 01/11/2023    Immunization status: Immunization History  Administered Date(s) Administered   Influenza Inj Mdck Quad Pf 12/30/2016, 11/30/2017   Influenza Split 11/30/2013   Influenza, Seasonal, Injecte, Preservative Fre 11/12/2022   Influenza-Unspecified 12/01/2018, 10/31/2021   Tdap 03/24/2014    External Records Personally Reviewed: family medicine, ED  Assessment:  Severe persistent asthma not well controlled Allergic rhinitis controlled GERD, on PPI, controlled  Plan/Recommendations:  Continue advair at high dose 1 puff twice daily Continue spiriva  inhaler 2 puffs once daily.  Continue albuterol  either inhaler or nebulized as needed up to 6 times/day.   Contniue acid reflux medication Resume astelin, cetirizine , montelukast  for allergies and asthma  Continue  dupixent  injections.   I do not think that all of his symptoms are related to asthma - should not be taking albuterol  6-12 times/day for asthma symptoms. Sounds like has difficulty taking a deep breath in rather than exhaling. Normal pfts. Normal feno. Would  explore co-morbid EILO. Will refer to Laryngology/ENT.  I spent 30 minutes in the care of this patient today including pre-charting, chart review, review of results, face-to-face care, coordination of care and communication with consultants etc.).   Return to Care: Return in about 3 months (around 07/10/2023).   Louie Rover, MD Pulmonary and Critical Care Medicine [facility] Office:[phone]

## 2023-04-13 ENCOUNTER — Ambulatory Visit: Payer: Medicaid Other | Admitting: Occupational Therapy

## 2023-04-15 ENCOUNTER — Ambulatory Visit: Payer: Medicaid Other | Admitting: Occupational Therapy

## 2023-04-15 DIAGNOSIS — M25521 Pain in right elbow: Secondary | ICD-10-CM

## 2023-04-15 DIAGNOSIS — M25631 Stiffness of right wrist, not elsewhere classified: Secondary | ICD-10-CM

## 2023-04-15 DIAGNOSIS — M6281 Muscle weakness (generalized): Secondary | ICD-10-CM

## 2023-04-15 DIAGNOSIS — M25531 Pain in right wrist: Secondary | ICD-10-CM

## 2023-04-15 NOTE — Therapy (Signed)
OUTPATIENT OCCUPATIONAL THERAPY ORTHO TREATMENT  Patient Name: Julian Oneal MRN: [id number] DOB:07-15-1965, 58 y.o., male Today's Date: 04/15/2023  PCP: Dr Hendricks Limes PROVIDER: Dr Freida Busman  END OF SESSION:  OT End of Session - 04/15/23 0734     Visit Number 3    Number of Visits 12    Date for OT Re-Evaluation 05/31/23    OT Start Time 0734    OT Stop Time 0820    OT Time Calculation (min) 46 min    Activity Tolerance Patient tolerated treatment well    Behavior During Therapy [facility] for tasks assessed/performed             Past Medical History:  Diagnosis Date   Bell's palsy 2008   Dr Anne Hahn   Dyspnea 08/28/2014   Followed in Pulmonary clinic/ [facility]/ [facility]  - 08/28/2014  Walked RA x 3 laps @ 185 ft each stopped due to end of study, fast pace, no doe or desats    - trial off acei 08/28/2014 >> improved 10/15/2014  - PFT's  10/21/2014  FEV1 3.40 (87 % ) ratio 88  p 6 % improvement from saba with DLCO  83 % corrects to 124 % for alv volume      Excessive daytime sleepiness 04/04/2015   Headache(784.0)    Headaches, cluster 06/20/2013   High cholesterol    Hypertension    Insomnia    Obstructive sleep apnea 07/05/2012   Patient diagnosed with OSA and using CPAP at 6 cm water , with residual AHI of 0.8 on 07-04-12 . [facility] is his  DME .     Persistent headaches 07/05/2012    [facility] Dr. Neale Burly in 2013 -     Pneumonia, viral 06/20/2013   Taste absent    Past Surgical History:  Procedure Laterality Date   CHOLECYSTECTOMY  2003   FOOT SURGERY  2008   HERNIA REPAIR  [id number]   MOUTH SURGERY     Patient Active Problem List   Diagnosis Date Noted   Chronic maxillary sinusitis 05/04/2022   Rhinitis medicamentosa 05/04/2022   Hypertrophy of both inferior nasal turbinates 04/10/2022   Nasal congestion 04/10/2022   Deviated nasal septum 03/25/2022   Recurrent sinusitis 03/25/2022   Right elbow pain 05/20/2020   Pain in finger of left  hand 06/27/2019   Excessive daytime sleepiness 04/04/2015   OSA on CPAP 04/04/2015   Essential hypertension 08/29/2014   Dyspnea 08/28/2014   Pneumonia, viral 06/20/2013   Headaches, cluster 06/20/2013   Sleep apnea with use of continuous positive airway pressure (CPAP) 08/02/2012   Insomnia, persistent 07/05/2012   Obstructive sleep apnea 07/05/2012   Persistent headaches 07/05/2012    ONSET DATE: 6 months  REFERRING DIAG: R wrist ECU tendinitis , elbow pain and stiffness   THERAPY DIAG:  Pain in right wrist  Pain in right elbow  Stiffness of right wrist, not elsewhere classified  Muscle weakness (generalized)  Rationale for Evaluation and Treatment: Rehabilitation  SUBJECTIVE:   SUBJECTIVE STATEMENT: I did buy a strap for my elbow but I found my old one.  Like the old one better.  They brought it in for you to look at.  Pain is a little better. Pt accompanied by: self  PERTINENT HISTORY: Patient seen Dr. Freida Busman 03/25/2023 with increased right wrist and elbow pain.  Has been increased since using a cane for the right knee.  Increased popping and stiffness in the right knee as  well as wrist.  Also caretaker for his mom does have to help with transfers.  Referred to OT.  Patient was fitted with a soft brace for nighttime for the wrist and can wear a sleeve on the elbow.  PRECAUTIONS: None     WEIGHT BEARING RESTRICTIONS: No  PAIN:  Are you having pain?  Pain at rest 0/10 increases to a 4-6/10  at lateral epicondyle reaching picking up objects  FALLS: Has patient fallen in last 6 months?  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  UPPER EXTREMITY ROM:     Active ROM Right eval Left eval R  04/08/23 R 04/15/23  Shoulder flexion      Shoulder abduction      Shoulder adduction      Shoulder extension      Shoulder internal rotation      Shoulder external rotation      Elbow flexion      Elbow extension      Wrist flexion 75 pull at elbow   75 pull at elbow   Wrist extension 40 pain elbow 68 45 elbow  58 end of session  Wrist ulnar deviation 25 pain wrist 35 35 elbow   Wrist radial deviation 25 25 25    Wrist pronation 90 pain elbow  90   Wrist supination 90  90 pain elbow   (Blank rows = not tested)  Active ROM Right eval Left eval  Thumb MCP (0-60)    Thumb IP (0-80)    Thumb Radial abd/add (0-55)     Thumb Palmar abd/add (0-45)     Thumb Opposition to Small Finger     Index MCP (0-90)     Index PIP (0-100)     Index DIP (0-70)      Long MCP (0-90)      Long PIP (0-100)      Long DIP (0-70)      Ring MCP (0-90)      Ring PIP (0-100)      Ring DIP (0-70)      Little MCP (0-90)      Little PIP (0-100)      Little DIP (0-70)      Fisting and opposition WNL - no pain -and thumb AROM WNL - no pain   UPPER EXTREMITY MMT:   Pain at left upper condyle with resistance to wrist extension and third digit extension Tenderness over lateral epicondyle  HAND FUNCTION: Grip strength: Right: 66 pain elbow ( 72 ext arm) lbs; Left: 87 and 90 ext arm  lbs, Lateral pinch: Right: 15 lbs, Left: 21 lbs, and 3 point pinch: Right: 9 lbs, Left: 12 lbs  COORDINATION: WNL  SENSATION: Denies any sensory issues  EDEMA: None noticed  COGNITION: Overall cognitive status: Within functional limits for tasks assessed      TREATMENT DATE: 04/15/23  Modalities: Heat  to elbow   Time: 8 min  Location: Right elbow Prior soft tissue and  forearm stretches ito decrease pain and stiffness  Done Grasston on volar forearm/ extensors  with sweeping #2 - pt to do some soft tissue massage to flexors at home with L hand -and cross friction to lat epiconydyle- prior to stretches- show increase motion  Joint mobs to wrist with light traction by OT  COnt stretches to elbow at side with loose fist in neutral and pronation 5 reps hold 5 seconds each Add extended arm gentle forearm  extensor stretch - open hand - 5 reps hold 5 sec - pronation and neutral - pain free  Cont with gentle elbow to side flexor stretches using left hand at palm for a gentle stretch on volar wrist.  5 x 5 reps STOP prior to feeling any symptoms at lateral epicondyle  Patient to do at home heat followed by cross friction massage/ soft tissue  and light to stretches hold 5 seconds 5 reps with elbow to side  Review with and application and  wearing counterforce strap correctly during the day with activities to decrease pain Pain had less pain with counter force strap on - when did pre and post lifting activity extende arm  Reinforced again  with patient modifications on grasping to decrease pain at lateral epicondyle.  Especially with him having some farm animals  Done at the end iontophoresis with dexamethasone on the medium patch at  R lateral epicondyle on the right-2.0 current  19 min  Patient tolerating very well patient can keep patch on for hour       PATIENT EDUCATION: Education details: findings of eval and HEP  Person educated: Patient Education method: Explanation, Demonstration, Tactile cues, Verbal cues, and Handouts Education comprehension: verbalized understanding, returned demonstration, verbal cues required, and needs further education    GOALS: Goals reviewed with patient?  Yes  SHORT TERM GOALS: Target date: 2 wks   Patient to be independent in a home program to wear corrective braces, and modify activities to decrease pain in right elbow and wrist Baseline: Pain at right wrist 1-10/10 with use and right elbow pain can increase to 6-7/10.  Patient not wearing anything on the elbow and sometimes at the wrist Goal status: INITIAL  LONG TERM GOALS: Target date: 8 wks  Right wrist active range of motion increased to within normal limits symptom-free. Baseline: Pain with wrist flexion and extension as well as ulnar deviation and pronation.  Wrist extension 40 flexion 75.  With ulnar wrist pain as well as lateral condyle pain Goal status: INITIAL  2.  Pain in right elbow decreased to less than 2/10 with active range of motion within normal limits to initiate strengthening. Baseline: Pain in the lateral condyle well as tenderness 6-7/10 Goal status: INITIAL  3.  Patient can tolerate strengthening to right upper arm for 30 minutes without increase symptoms Baseline: Pain with active range of motion wrist extension and pronation and ulnar deviation, tenderness 6-7/10 Goal status: INITIAL  4.  Patient verbalize and implement 3 modifications to work and home activities to decrease pain in right elbow and wrist to less than 2/10 Baseline: Has no knowledge pain can increase at the elbow 6-7/10 and the wrist 1-10/10 Goal status: INITIAL   ASSESSMENT:  CLINICAL IMPRESSION: Patient seen today for occupational therapy evaluation for right wrist and elbow pain.  Patient is right-hand dominant.  Patient had a fall to 2 years ago that  injured right side of body.  But the last 6 months right wrist and elbow pain increased after using a cane as well as being a caretaker for his mom helping with transfers.  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  Pain at the wrist can be from a 0-10/10 on the ulnar side.  Elbow pain patient present  with tenderness over the left upper condyle with a positive test for resistance to wrist extension and third digit extension.  Pain increases to 6-7/10 with gripping, twisting and wrist extension and pronation.  Patient with decreased grip and prehension strength compared to the left hand.  NOW patient report pain little better.  Was able to get counterforce strap to use.  Reviewed application and wearing.  Patient showing progress in R wrist flexion extension range of motion. Patient limited in functional use of right dominant hand in ADLs and IADLs by increased pain decreased range of motion and strength in right dominant hand.  Patient can benefit from skilled OT services to decrease pain at right elbow and wrist, increase motion and increase strength to return to prior level of function.  PERFORMANCE DEFICITS: in functional skills including ADLs, IADLs, ROM, strength, pain, flexibility, decreased knowledge of use of DME, and UE functional use,   and psychosocial skills including environmental adaptation and routines and behaviors.   IMPAIRMENTS: are limiting patient from ADLs, IADLs, rest and sleep, play, leisure, and social participation.   COMORBIDITIES: has no other co-morbidities that affects occupational performance. Patient will benefit from skilled OT to address above impairments and improve overall function.  MODIFICATION OR ASSISTANCE TO COMPLETE EVALUATION: No modification of tasks or assist necessary to complete an evaluation.  OT OCCUPATIONAL PROFILE AND HISTORY: Problem focused assessment: Including review of records relating to presenting problem.  CLINICAL DECISION MAKING: LOW - limited treatment options, no task modification necessary  REHAB POTENTIAL: Good for goals  EVALUATION COMPLEXITY: Low    PLAN:  OT FREQUENCY: 1-2x/week depending on progres  OT DURATION: 8 wks  PLANNED INTERVENTIONS: 97168 OT Re-evaluation, 97535 self care/ADL training, 57846 therapeutic  exercise, 97530 therapeutic activity, 97140 manual therapy, 97035 ultrasound, 97018 paraffin, 96295 fluidotherapy, 97034 contrast bath, 97033 iontophoresis, 97760 Orthotics management and training, passive range of motion, patient/family education, and DME and/or AE instructions    CONSULTED AND AGREED WITH PLAN OF CARE: Patient     Oletta Cohn, OTR/L,CLT 04/15/2023, 8:12 AM

## 2023-04-19 ENCOUNTER — Ambulatory Visit: Payer: Medicaid Other | Admitting: Occupational Therapy

## 2023-04-19 DIAGNOSIS — M25631 Stiffness of right wrist, not elsewhere classified: Secondary | ICD-10-CM

## 2023-04-19 DIAGNOSIS — M25531 Pain in right wrist: Secondary | ICD-10-CM

## 2023-04-19 DIAGNOSIS — M25521 Pain in right elbow: Secondary | ICD-10-CM

## 2023-04-19 DIAGNOSIS — M6281 Muscle weakness (generalized): Secondary | ICD-10-CM

## 2023-04-19 NOTE — Therapy (Signed)
OUTPATIENT OCCUPATIONAL THERAPY ORTHO TREATMENT  Patient Name: Julian Oneal MRN: [id number] DOB:30-Aug-1965, 58 y.o., male Today's Date: 04/19/2023  PCP: Dr Hendricks Limes PROVIDER: Dr Freida Busman  END OF SESSION:  OT End of Session - 04/19/23 0832     Visit Number 4    Number of Visits 12    Date for OT Re-Evaluation 05/31/23    OT Start Time 0826    OT Stop Time 0904    OT Time Calculation (min) 38 min    Activity Tolerance Patient tolerated treatment well    Behavior During Therapy [facility] for tasks assessed/performed             Past Medical History:  Diagnosis Date   Bell's palsy 2008   Dr Anne Hahn   Dyspnea 08/28/2014   Followed in Pulmonary clinic/ [facility]/ [facility]  - 08/28/2014  Walked RA x 3 laps @ 185 ft each stopped due to end of study, fast pace, no doe or desats    - trial off acei 08/28/2014 >> improved 10/15/2014  - PFT's  10/21/2014  FEV1 3.40 (87 % ) ratio 88  p 6 % improvement from saba with DLCO  83 % corrects to 124 % for alv volume      Excessive daytime sleepiness 04/04/2015   Headache(784.0)    Headaches, cluster 06/20/2013   High cholesterol    Hypertension    Insomnia    Obstructive sleep apnea 07/05/2012   Patient diagnosed with OSA and using CPAP at 6 cm water , with residual AHI of 0.8 on 07-04-12 . [facility] is his  DME .     Persistent headaches 07/05/2012    Referred [facility] Dr. Neale Burly in 2013 -     Pneumonia, viral 06/20/2013   Taste absent    Past Surgical History:  Procedure Laterality Date   CHOLECYSTECTOMY  2003   FOOT SURGERY  2008   HERNIA REPAIR  [phone]   MOUTH SURGERY     Patient Active Problem List   Diagnosis Date Noted   Chronic maxillary sinusitis 05/04/2022   Rhinitis medicamentosa 05/04/2022   Hypertrophy of both inferior nasal turbinates 04/10/2022   Nasal congestion 04/10/2022   Deviated nasal septum 03/25/2022   Recurrent sinusitis 03/25/2022   Right elbow pain 05/20/2020   Pain in finger of left  hand 06/27/2019   Excessive daytime sleepiness 04/04/2015   OSA on CPAP 04/04/2015   Essential hypertension 08/29/2014   Dyspnea 08/28/2014   Pneumonia, viral 06/20/2013   Headaches, cluster 06/20/2013   Sleep apnea with use of continuous positive airway pressure (CPAP) 08/02/2012   Insomnia, persistent 07/05/2012   Obstructive sleep apnea 07/05/2012   Persistent headaches 07/05/2012    ONSET DATE: 6 months  REFERRING DIAG: R wrist ECU tendinitis , elbow pain and stiffness   THERAPY DIAG:  Pain in right wrist  Pain in right elbow  Stiffness of right wrist, not elsewhere classified  Muscle weakness (generalized)  Rationale for Evaluation and Treatment: Rehabilitation  SUBJECTIVE:   SUBJECTIVE STATEMENT: I could feel it the other day - had to use the screwdriver a lot - but other wise doing okay - little better  Pt accompanied by: self  PERTINENT HISTORY: Patient seen Dr. Freida Busman 03/25/2023 with increased right wrist and elbow pain.  Has been increased since using a cane for the right knee.  Increased popping and stiffness in the right knee as well as wrist.  Also caretaker for his mom does have  to help with transfers.  Referred to OT.  Patient was fitted with a soft brace for nighttime for the wrist and can wear a sleeve on the elbow.  PRECAUTIONS: None     WEIGHT BEARING RESTRICTIONS: No  PAIN:  Are you having pain?  Pain at rest 0/10 increases in session to 4/10 at work 6/10 FALLS: Has patient fallen in last 6 months?  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  UPPER EXTREMITY ROM:     Active ROM Right eval Left eval R  04/08/23 R 04/15/23 R 04/19/23  Shoulder flexion       Shoulder abduction       Shoulder adduction       Shoulder extension       Shoulder internal rotation       Shoulder external rotation       Elbow flexion       Elbow extension       Wrist flexion 75 pull at elbow   75 pull at elbow    Wrist extension 40 pain elbow 68 45 elbow  58 end of session 56 coming in /in session 64  Wrist ulnar deviation 25 pain wrist 35 35 elbow    Wrist radial deviation 25 25 25     Wrist pronation 90 pain elbow  90    Wrist supination 90  90 pain elbow  Less pain today   (Blank rows = not tested)  Active ROM Right eval Left eval  Thumb MCP (0-60)    Thumb IP (0-80)    Thumb Radial abd/add (0-55)     Thumb Palmar abd/add (0-45)     Thumb Opposition to Small Finger     Index MCP (0-90)     Index PIP (0-100)     Index DIP (0-70)      Long MCP (0-90)      Long PIP (0-100)      Long DIP (0-70)      Ring MCP (0-90)      Ring PIP (0-100)      Ring DIP (0-70)      Little MCP (0-90)      Little PIP (0-100)      Little DIP (0-70)      Fisting and opposition WNL - no pain -and thumb AROM WNL - no pain   UPPER EXTREMITY MMT:   Pain at left upper condyle with resistance to wrist extension and third digit extension Tenderness over lateral epicondyle  HAND FUNCTION: Grip strength: Right: 66 pain elbow ( 72 ext arm) lbs; Left: 87 and 90 ext arm  lbs, Lateral pinch: Right: 15 lbs, Left: 21 lbs, and 3 point pinch: Right: 9 lbs, Left: 12 lbs  COORDINATION: WNL  SENSATION: Denies any sensory issues  EDEMA: None noticed  COGNITION: Overall cognitive status: Within functional limits for tasks assessed      TREATMENT DATE: 04/15/23  Modalities: Heat  to elbow   Time: 6 min  Location: Right elbow Prior soft tissue and  forearm stretches ito decrease pain and stiffness  Done [place]asston on volar forearm/ extensors  with sweeping #2 - pt to do some soft tissue massage to flexors at home with L hand -and cross friction to lat epiconydyle- prior to stretches- show increase motion  Joint mobs to wrist with light traction by OT  COnt stretches to elbow at side with loose fist in neutral and pronation 5 reps hold 5 seconds each Extended arm gentle forearm  extensor stretch - open hand - 5 reps hold 5 sec - pronation and neutral - pain free  Cont with gentle elbow to side flexor stretches using left hand at palm for a gentle stretch on volar wrist.  5 x 5 reps STOP prior to feeling any symptoms at lateral epicondyle Attempted AAROM over edge of table and table slides -but had pain at lat epicondyle   Patient to do at home heat followed by cross friction massage/ soft tissue  and light to stretches hold 5 seconds 5 reps with elbow to side  Cont  wearing counterforce strap correctly during the day with activities to decrease pain Pain had less pain with counter force strap on - when did pre and post lifting activity extende arm  Reinforced again  with patient modifications on grasping to decrease pain at lateral epicondyle.  Especially with him having some farm animals  Done at the end iontophoresis with dexamethasone on the medium patch at  R lateral epicondyle on the right-2.0 current  19 min  2nd time  Patient tolerating very well patient can keep patch on for hour       PATIENT EDUCATION: Education details: findings of eval and HEP  Person educated: Patient Education method: Explanation, Demonstration, Tactile cues, Verbal cues, and Handouts Education comprehension: verbalized understanding, returned demonstration, verbal cues required,  and needs further education    GOALS: Goals reviewed with patient? Yes  SHORT TERM GOALS: Target date: 2 wks   Patient to be independent in a home program to wear corrective braces, and modify activities to decrease pain in right elbow and wrist Baseline: Pain at right wrist 1-10/10 with use and right elbow pain can increase to 6-7/10.  Patient not wearing anything on the elbow and sometimes at the wrist Goal status: INITIAL  LONG TERM GOALS: Target date: 8 wks  Right wrist active range of motion increased to within normal limits symptom-free. Baseline: Pain with wrist flexion and extension as well as ulnar deviation and pronation.  Wrist extension 40 flexion 75.  With ulnar wrist pain as well as lateral condyle pain Goal status: INITIAL  2.  Pain in right elbow decreased to less than 2/10 with active range of motion within normal limits to initiate strengthening. Baseline: Pain in the lateral condyle well as tenderness 6-7/10 Goal status: INITIAL  3.  Patient can tolerate strengthening to right upper arm for 30 minutes without increase symptoms Baseline: Pain with active range of motion wrist extension and pronation and ulnar deviation, tenderness 6-7/10 Goal status: INITIAL  4.  Patient verbalize and implement 3 modifications to work and home activities to decrease pain in right elbow and wrist to less than 2/10 Baseline: Has no knowledge pain can increase at the elbow 6-7/10 and the wrist 1-10/10 Goal status: INITIAL   ASSESSMENT:  CLINICAL IMPRESSION: Patient seen today for occupational therapy evaluation for right wrist and elbow pain.  Patient is right-hand dominant.  Patient had a fall to 2 years ago that injured right side of body.  But the last 6 months right wrist and elbow pain increased after using a cane as well as being a caretaker for his mom helping with transfers.  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  Pain at the  wrist can be from a 0-10/10 on the ulnar side.  Elbow pain patient present with tenderness over the left upper condyle with a positive test for resistance to wrist extension and third digit extension.  Pain increases to 6-7/10 with gripping, twisting and wrist extension and pronation.  Patient with decreased grip and prehension strength compared to the left hand.  NOW patient report pain little better.  Cont with counter force strap  Reviewed application and wearing last time.  Patient showing progress in R wrist extension range of motion. Pt to cont to modify using flexors and forearm for pick up and carry . Patient limited in functional use of right dominant hand in ADLs and IADLs by increased pain decreased range of motion and strength in right dominant hand.  Patient can benefit from skilled OT services to decrease pain at right elbow and wrist, increase motion and increase strength to return to prior level of function.  PERFORMANCE DEFICITS: in functional skills including ADLs, IADLs, ROM, strength, pain, flexibility, decreased knowledge of use of DME, and UE functional use,   and psychosocial skills including environmental adaptation and routines and behaviors.   IMPAIRMENTS: are limiting patient from ADLs, IADLs, rest and sleep, play, leisure, and social participation.   COMORBIDITIES: has no other co-morbidities that affects occupational performance. Patient will benefit from skilled OT to address above impairments and improve overall function.  MODIFICATION OR ASSISTANCE TO COMPLETE EVALUATION: No modification of tasks or assist necessary to complete an evaluation.  OT OCCUPATIONAL PROFILE AND HISTORY: Problem focused assessment: Including review of records relating to presenting problem.  CLINICAL DECISION MAKING: LOW - limited treatment options, no task modification necessary  REHAB POTENTIAL: Good for goals  EVALUATION COMPLEXITY: Low    PLAN:  OT FREQUENCY: 1-2x/week depending on  progres  OT DURATION: 8 wks  PLANNED INTERVENTIONS: 97168 OT Re-evaluation, 97535 self care/ADL training, 13086 therapeutic exercise, 97530 therapeutic activity, 97140 manual therapy, 97035 ultrasound, 97018 paraffin, 57846 fluidotherapy, 97034 contrast bath, 97033 iontophoresis, 97760 Orthotics management and training, passive range of motion, patient/family education, and DME and/or AE instructions    CONSULTED AND AGREED WITH PLAN OF CARE: Patient     Oletta Cohn, OTR/L,CLT 04/19/2023, 9:01 AM

## 2023-04-21 ENCOUNTER — Other Ambulatory Visit: Payer: Self-pay

## 2023-04-21 ENCOUNTER — Emergency Department
Admission: EM | Admit: 2023-04-21 | Discharge: 2023-04-21 | Disposition: A | Discharge status: Home / Self Care | Payer: Medicaid Other | Attending: Emergency Medicine | Admitting: Emergency Medicine

## 2023-04-21 ENCOUNTER — Emergency Department: Payer: Medicaid Other

## 2023-04-21 ENCOUNTER — Encounter: Payer: Self-pay | Admitting: Emergency Medicine

## 2023-04-21 DIAGNOSIS — W108XXA Fall (on) (from) other stairs and steps, initial encounter: Secondary | ICD-10-CM | POA: Insufficient documentation

## 2023-04-21 DIAGNOSIS — S0081XA Abrasion of other part of head, initial encounter: Secondary | ICD-10-CM | POA: Insufficient documentation

## 2023-04-21 DIAGNOSIS — I10 Essential (primary) hypertension: Secondary | ICD-10-CM | POA: Insufficient documentation

## 2023-04-21 DIAGNOSIS — W19XXXA Unspecified fall, initial encounter: Secondary | ICD-10-CM

## 2023-04-21 DIAGNOSIS — Z79899 Other long term (current) drug therapy: Secondary | ICD-10-CM | POA: Insufficient documentation

## 2023-04-21 DIAGNOSIS — M25521 Pain in right elbow: Secondary | ICD-10-CM | POA: Insufficient documentation

## 2023-04-21 DIAGNOSIS — S060X1A Concussion with loss of consciousness of 30 minutes or less, initial encounter: Secondary | ICD-10-CM | POA: Diagnosis not present

## 2023-04-21 DIAGNOSIS — M25561 Pain in right knee: Secondary | ICD-10-CM | POA: Insufficient documentation

## 2023-04-21 DIAGNOSIS — S0990XA Unspecified injury of head, initial encounter: Secondary | ICD-10-CM | POA: Diagnosis present

## 2023-04-21 LAB — COMPREHENSIVE METABOLIC PANEL
ALT: 21 U/L (ref 0–44)
AST: 25 U/L (ref 15–41)
Albumin: 4.1 g/dL (ref 3.5–5.0)
Alkaline Phosphatase: 33 U/L — ABNORMAL LOW (ref 38–126)
Anion gap: 8 (ref 5–15)
BUN: 18 mg/dL (ref 6–20)
CO2: 25 mmol/L (ref 22–32)
Calcium: 9.2 mg/dL (ref 8.9–10.3)
Chloride: 103 mmol/L (ref 98–111)
Creatinine, Ser: 0.96 mg/dL (ref 0.61–1.24)
GFR, Estimated: >60 mL/min (ref >60)
Glucose, Bld: 110 mg/dL — ABNORMAL HIGH (ref 70–99)
Potassium: 3.4 mmol/L — ABNORMAL LOW (ref 3.5–5.1)
Sodium: 136 mmol/L (ref 135–145)
Total Bilirubin: 1 mg/dL (ref 0.0–1.2)
Total Protein: 6.2 g/dL — ABNORMAL LOW (ref 6.5–8.1)

## 2023-04-21 LAB — URINALYSIS, ROUTINE W REFLEX MICROSCOPIC
Bilirubin Urine: NEGATIVE
Glucose, UA: NEGATIVE mg/dL
Hgb urine dipstick: NEGATIVE
Ketones, ur: NEGATIVE mg/dL
Leukocytes,Ua: NEGATIVE
Nitrite: NEGATIVE
Protein, ur: NEGATIVE mg/dL
Specific Gravity, Urine: 1.013 (ref 1.005–1.030)
pH: 7 (ref 5.0–8.0)

## 2023-04-21 LAB — URINE DRUG SCREEN, QUALITATIVE (ARMC ONLY)
Amphetamines, Ur Screen: NOT DETECTED
Barbiturates, Ur Screen: NOT DETECTED
Benzodiazepine, Ur Scrn: POSITIVE — AB
Cannabinoid 50 Ng, Ur ~~LOC~~: NOT DETECTED
Cocaine Metabolite,Ur ~~LOC~~: NOT DETECTED
MDMA (Ecstasy)Ur Screen: POSITIVE — AB
Methadone Scn, Ur: NOT DETECTED
Opiate, Ur Screen: NOT DETECTED
Phencyclidine (PCP) Ur S: NOT DETECTED
Tricyclic, Ur Screen: NOT DETECTED

## 2023-04-21 LAB — CBC WITH DIFFERENTIAL/PLATELET
Abs Immature Granulocytes: 0.01 10*3/uL (ref 0.00–0.07)
Basophils Absolute: 0 10*3/uL (ref 0.0–0.1)
Basophils Relative: 1 %
Eosinophils Absolute: 0 10*3/uL (ref 0.0–0.5)
Eosinophils Relative: 1 %
HCT: 38.3 % — ABNORMAL LOW (ref 39.0–52.0)
Hemoglobin: 13.4 g/dL (ref 13.0–17.0)
Immature Granulocytes: 0 %
Lymphocytes Relative: 30 %
Lymphs Abs: 1.8 10*3/uL (ref 0.7–4.0)
MCH: 30.7 pg (ref 26.0–34.0)
MCHC: 35 g/dL (ref 30.0–36.0)
MCV: 87.6 fL (ref 80.0–100.0)
Monocytes Absolute: 0.7 10*3/uL (ref 0.1–1.0)
Monocytes Relative: 11 %
Neutro Abs: 3.5 10*3/uL (ref 1.7–7.7)
Neutrophils Relative %: 57 %
Platelets: 170 10*3/uL (ref 150–400)
RBC: 4.37 MIL/uL (ref 4.22–5.81)
RDW: 12.1 % (ref 11.5–15.5)
WBC: 6 10*3/uL (ref 4.0–10.5)
nRBC: 0 % (ref 0.0–0.2)

## 2023-04-21 LAB — TROPONIN I (HIGH SENSITIVITY): Troponin I (High Sensitivity): 5 ng/L (ref <18)

## 2023-04-21 LAB — ETHANOL: Alcohol, Ethyl (B): <10 mg/dL (ref <10)

## 2023-04-21 NOTE — ED Provider Notes (Signed)
[facility] Provider Note    Event Date/Time   First MD Initiated Contact with Patient 04/21/23 0411     (approximate)   History   Chief Complaint Loss of Consciousness   HPI  Julian Oneal is a 58 y.o. male with past medical history of hypertension and hyperlipidemia who presents to the ED complaining of loss of consciousness.  Patient reports that approximately 24 hours ago he was having difficulty sleeping, believes he was wandering around the house and may have been sleepwalking.  He believes he fell down the steps and hit his head, subsequently lost consciousness.  He does not think he lost consciousness prior to the fall and does not recall any chest pain or shortness of breath.  He does state that he has had an abrasion to his right cheek with some headache and right facial pain since the fall.  He describes some dizziness and nausea, is concerned he may have a concussion.  He additionally complains of pain at his right knee and elbow, but has been ambulatory since the fall.  He does not take any blood thinners, does report concerned that he may have accidentally taken an extra dose of his Xanax last night.     Physical Exam   Triage Vital Signs: ED Triage Vitals  Encounter Vitals Group     BP 04/21/23 0211 (!) 148/99     Systolic BP Percentile --      Diastolic BP Percentile --      Pulse Rate 04/21/23 0211 84     Resp 04/21/23 0211 20     Temp 04/21/23 0211 98.2 F (36.8 C)     Temp Source 04/21/23 0211 Oral     SpO2 04/21/23 0211 98 %     Weight 04/21/23 0213 160 lb (72.6 kg)     Height 04/21/23 0213 5\' 10"  (1.778 m)     Head Circumference --      Peak Flow --      Pain Score 04/21/23 0213 10     Pain Loc --      Pain Education --      Exclude from Growth Chart --     Most recent vital signs: Vitals:   04/21/23 0211  BP: (!) 148/99  Pulse: 84  Resp: 20  Temp: 98.2 F (36.8 C)  SpO2: 98%    Constitutional: Alert and  oriented. Eyes: Conjunctivae are normal. Head: Abrasion to right cheek with right maxillary tenderness. Nose: No congestion/rhinnorhea. Mouth/Throat: Mucous membranes are moist.  Neck: No midline cervical spine tenderness to palpation. Cardiovascular: Normal rate, regular rhythm. Grossly normal heart sounds.  2+ radial pulses bilaterally. Respiratory: Normal respiratory effort.  No retractions. Lungs CTAB. Gastrointestinal: Soft and nontender. No distention. Musculoskeletal: Diffuse tenderness to palpation to right elbow and right knee with no obvious deformity. Neurologic:  Normal speech and language. No gross focal neurologic deficits are appreciated.    ED Results / Procedures / Treatments   Labs (all labs ordered are listed, but only abnormal results are displayed) Labs Reviewed  URINALYSIS, ROUTINE W REFLEX MICROSCOPIC - Abnormal; Notable for the following components:      Result Value   Color, Urine YELLOW (*)    APPearance CLEAR (*)    All other components within normal limits  URINE DRUG SCREEN, QUALITATIVE ([facility] ONLY) - Abnormal; Notable for the following components:   MDMA (Ecstasy)Ur Screen POSITIVE (*)    Benzodiazepine, Ur Scrn POSITIVE (*)  All other components within normal limits  CBC WITH DIFFERENTIAL/PLATELET - Abnormal; Notable for the following components:   HCT 38.3 (*)    All other components within normal limits  COMPREHENSIVE METABOLIC PANEL - Abnormal; Notable for the following components:   Potassium 3.4 (*)    Glucose, Bld 110 (*)    Total Protein 6.2 (*)    Alkaline Phosphatase 33 (*)    All other components within normal limits  ETHANOL  TROPONIN I (HIGH SENSITIVITY)     EKG  ED ECG REPORT I, Chesley Noon, the attending physician, personally viewed and interpreted this ECG.   Date: 04/21/2023  EKG Time: 2:38  Rate: 67  Rhythm: normal sinus rhythm  Axis: Normal  Intervals:none  ST&T Change: None  RADIOLOGY CT head reviewed and  interpreted by me with no hemorrhage or midline shift.  PROCEDURES:  Critical Care performed: No  Procedures   MEDICATIONS ORDERED IN ED: Medications - No data to display   IMPRESSION / MDM / ASSESSMENT AND PLAN / ED COURSE  I reviewed the triage vital signs and the nursing notes.                              58 y.o. male with past medical history of hypertension and hyperlipidemia who presents to the ED complaining of headache and facial pain after he had a fall about 24 hours ago.  Patient's presentation is most consistent with acute presentation with potential threat to life or bodily function.  Differential diagnosis includes, but is not limited to, intracranial injury, cervical spine injury, maxillofacial injury, syncope, arrhythmia, anemia, electrolyte abnormality, AKI, concussion.  Patient nontoxic-appearing and in no acute distress, vital signs are unremarkable.  EKG shows no evidence of arrhythmia or ischemia, patient believes that he lost consciousness following the fall rather than prior to the fall.  It does seem that Xanax and trazodone likely played a role in his fall, CT head, maxillofacial, and cervical spine are negative for acute process.  He does have symptoms consistent with concussion, labs unremarkable with no significant anemia, leukocytosis, electrolyte abnormality, or AKI.  Troponin within normal limits and I doubt cardiac etiology for his fall.  UDS did come back positive for MDMA in addition to benzodiazepines, however patient denies any MDMA use.  X-ray imaging of his right elbow and knee are also unremarkable, patient appropriate for discharge home with outpatient follow-up.  He was counseled to return to the ED for new or worsening symptoms.  Patient agrees with plan.      FINAL CLINICAL IMPRESSION(S) / ED DIAGNOSES   Final diagnoses:  Fall, initial encounter  Concussion with loss of consciousness of 30 minutes or less, initial encounter     Rx / DC  Orders   ED Discharge Orders     None        Note:  This document was prepared using Dragon voice recognition software and may include unintentional dictation errors.   Chesley Noon, MD 04/21/23 [phone]

## 2023-04-21 NOTE — ED Triage Notes (Signed)
Patient ambulatory to triage with steady gait, without difficulty or distress noted; pt reports syncopal episode 5am yesterday; st does not remember what happened, only that he was awake, couldn't sleep and awoke on the ground; abrasion to rt cheek; c/o pain to rt knee, rt elbow, rt side head and "eye socket"; pt st "I could have doubled up on my trazodone and xanax"

## 2023-04-22 ENCOUNTER — Ambulatory Visit: Payer: Medicaid Other | Admitting: Occupational Therapy

## 2023-04-23 ENCOUNTER — Other Ambulatory Visit: Payer: Self-pay

## 2023-04-26 ENCOUNTER — Other Ambulatory Visit: Payer: Self-pay

## 2023-05-03 ENCOUNTER — Other Ambulatory Visit (HOSPITAL_COMMUNITY): Payer: Self-pay

## 2023-05-03 ENCOUNTER — Other Ambulatory Visit: Payer: Self-pay

## 2023-05-03 NOTE — Progress Notes (Signed)
 Specialty Pharmacy Refill Coordination Note  Julian Oneal is a 58 y.o. male contacted today regarding refills of specialty medication(s) Dupilumab (Dupixent)   Patient requested Delivery   Delivery date: 05/11/23   Verified address: [address]   [place] [address]   Medication will be filled on 05/10/23.

## 2023-05-07 ENCOUNTER — Other Ambulatory Visit: Payer: Self-pay

## 2023-05-25 ENCOUNTER — Encounter: Payer: Self-pay | Admitting: Neurology

## 2023-05-25 ENCOUNTER — Ambulatory Visit: Admitting: Neurology

## 2023-05-25 VITALS — BP 138/86 | HR 71 | Ht 70.0 in | Wt 158.0 lb

## 2023-05-25 DIAGNOSIS — R519 Headache, unspecified: Secondary | ICD-10-CM

## 2023-05-25 DIAGNOSIS — G4700 Insomnia, unspecified: Secondary | ICD-10-CM | POA: Diagnosis not present

## 2023-05-25 DIAGNOSIS — G4451 Hemicrania continua: Secondary | ICD-10-CM

## 2023-05-25 MED ORDER — GABAPENTIN 300 MG PO CAPS
300.0000 mg | ORAL_CAPSULE | Freq: Three times a day (TID) | ORAL | 11 refills | Status: AC
Start: 2023-05-25 — End: ?

## 2023-05-25 MED ORDER — DICLOFENAC SODIUM 50 MG PO TBEC: 50.0000 mg | DELAYED_RELEASE_TABLET | Freq: Two times a day (BID) | ORAL | 0 refills | Status: DC

## 2023-05-25 MED ORDER — NURTEC 75 MG PO TBDP: 75.0000 mg | ORAL_TABLET | Freq: Two times a day (BID) | ORAL | Status: DC

## 2023-05-25 NOTE — Progress Notes (Signed)
 [facility]  Provider:  Dr Julian Oneal Referring Provider: Hurman Horn, MD Primary Care Physician:  Julian Raider, MD  Chief Complaint  Patient presents with   New Patient (Initial Visit)    Patient in room #2 and alone. Patient states he been having headaches everyday and his days are worse then his night.    HPI:  Julian Oneal is a 58 y.o. male and seen here upon referral from Dr.Caroline  Freida Oneal for a Consultation/ Evaluation of posttraumatic headaches,    I have known Mr Julian Oneal for over 17 years, he was initially seen for migraines, was a IT sales professional at the time. It was finally dry needling that cured him of his headaches, and was applied to the neck , paraspinal, nuchal.   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 No children.  He then fell - and passed out. Loss of Consciousness.     HPI 04-21-2023   Julian Oneal is a 58 y.o. male with past medical history of hypertension and hyperlipidemia who presents to the ED complaining of loss of consciousness.  Patient reports that approximately 24 hours ago he was having difficulty sleeping, believes he was wandering around the house and may have been sleepwalking.  He believes he fell down the steps and hit his head, subsequently lost consciousness.  He does not think he lost consciousness prior to the fall and does not recall any chest pain or shortness of breath.  He does state that he has had an abrasion to his right cheek with some headache and right facial pain since the fall.  He describes some dizziness and nausea, is concerned he may have a concussion.  He additionally complains of pain at his right knee and elbow, but has been ambulatory since the fall.  He does not take any blood  thinners, does report concerned that he may have accidentally taken an extra dose of his Xanax last night.   Following the fall  04-21-2023 , the facial abrasions have healed but the headaches were excruciating.   A pressure from behind the right eye,  no vision changes but severe phono- and photophobia. Tearing  in the right eye, right face warmer , more flushed.  Nausea initially with concussion, but not now.    CT reviewed, maxillo -facial, Ct Head and CT neck spine.   All  right  sided headaches, not SUNC, hemicrania.      Review of Systems: Out of a complete 14 system review, the patient complains of only the following symptoms, and all other reviewed systems are negative.   Social History   Socioeconomic History   Marital status: Legally Separated    Spouse name: Not on file   Number of children: 0   Years of education: College   Highest education level: Not on file  Occupational History   Occupation: fireman    Comment: Neurosurgeon: [employer]  Tobacco Use   Smoking status: Former    Current packs/day: 0.00    Average packs/day: 0.3 packs/day for 5.0 years (1.3 ttl pk-yrs)    Types: Cigarettes    Start date: 03/02/1985    Quit date: 03/02/1990    Years  since quitting: 33.2   Smokeless tobacco: Never  Vaping Use   Vaping status: Never Used  Substance and Sexual Activity   Alcohol use: Not Currently    Comment: occasionally   Drug use: No   Sexual activity: Not on file  Other Topics Concern   Not on file  Social History Narrative   Caucasian male, married, right handed, 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 pt denies any illegal drugs, tobacco use(quit in 1992), consumes alcohol occasionally and consumes coffee.   Social Drivers of Corporate investment banker Strain: Not on file  Food Insecurity: Not on file  Transportation Needs: Not on file   Physical Activity: Not on file  Stress: Not on file  Social Connections: Not on file  Intimate Partner Violence: Not on file    Family History  Problem Relation Age of Onset   Prostate cancer Father    Cancer - Prostate Father    Allergies Sister    Heart disease Maternal Grandmother    Cancer - Prostate Maternal Grandfather    Prostate cancer Maternal Grandfather    COPD Maternal Grandfather    Heart disease Paternal Grandmother    Parkinson's disease Maternal Uncle    Alzheimer's disease Paternal Aunt        in their late 52's   Alzheimer's disease Paternal Uncle        in their late 56's    Past Medical History:  Diagnosis Date   Bell's palsy 2008   Dr Julian Oneal   Dyspnea 08/28/2014   Followed in Pulmonary clinic/ [facility]/ [facility]  - 08/28/2014  Walked RA x 3 laps @ 185 ft each stopped due to end of study, fast pace, no doe or desats    - trial off acei 08/28/2014 >> improved 10/15/2014  - PFT's  10/21/2014  FEV1 3.40 (87 % ) ratio 88  p 6 % improvement from saba with DLCO  83 % corrects to 124 % for alv volume      Excessive daytime sleepiness 04/04/2015   Headache(784.0)    Headaches, cluster 06/20/2013   High cholesterol    Hypertension    Insomnia    Obstructive sleep apnea 07/05/2012   Patient diagnosed with OSA and using CPAP at 6 cm water , with residual AHI of 0.8 on 07-04-12 . [employer] is his  DME .     Persistent headaches 07/05/2012    [facility] Dr. Neale Burly in 2013 -     Pneumonia, viral 06/20/2013   Taste absent     Past Surgical History:  Procedure Laterality Date   CHOLECYSTECTOMY  2003   FOOT SURGERY  2008   HERNIA REPAIR  [id number]   MOUTH SURGERY      Current Outpatient Medications  Medication Sig Dispense Refill   albuterol (PROVENTIL) (2.5 MG/3ML) 0.083% nebulizer solution Take 3 mLs (2.5 mg total) by nebulization every 6 (six) hours as needed for wheezing or shortness of breath. 75 mL 12   albuterol (VENTOLIN HFA)  108 (90 Base) MCG/ACT inhaler Inhale 2 puffs into the lungs every 4 (four) hours as needed. 1 each 5   alprazolam (XANAX) 2 MG tablet Take 2 mg by mouth at bedtime.     azelastine (ASTELIN) 0.1 % nasal spray Place 2 sprays into both nostrils 2 (two) times daily. Use in each nostril as directed     cetirizine (ZYRTEC) 10 MG  tablet Take 10 mg by mouth daily as needed.      Dupilumab (DUPIXENT) 300 MG/2ML SOAJ Inject 300 mg into the skin every 14 (fourteen) days. **loading dose completed in clinic on 01/11/23** 12 mL 1   fenofibrate 160 MG tablet Take 160 mg by mouth daily.     fluticasone-salmeterol (ADVAIR) 500-50 MCG/ACT AEPB Inhale 1 puff into the lungs in the morning and at bedtime. 1 each 5   linaclotide (LINZESS) 72 MCG capsule Take 72 mcg by mouth daily before breakfast.     montelukast (SINGULAIR) 10 MG tablet Take 1 tablet (10 mg total) by mouth at bedtime. 30 tablet 11   omeprazole (PRILOSEC) 20 MG capsule Take 20 mg by mouth in the morning and at bedtime.     tadalafil (CIALIS) 10 MG tablet Take 10 mg by mouth daily.  11   Tiotropium Bromide Monohydrate (SPIRIVA RESPIMAT) 2.5 MCG/ACT AERS Inhale 2 puffs into the lungs daily. 1 each 11   traZODone (DESYREL) 150 MG tablet TAKE 1 TABLET BY MOUTH EVERYDAY AT BEDTIME (Patient taking differently: Take 300 mg by mouth at bedtime. TAKE 1 TABLET BY MOUTH EVERYDAY AT BEDTIME) 30 tablet 0   valsartan (DIOVAN) 160 MG tablet TAKE 1 TABLET BY MOUTH EVERY DAY (Patient taking differently: Take 320 mg by mouth daily. TAKE 1 TABLET BY MOUTH EVERY DAY) 90 tablet 3   venlafaxine XR (EFFEXOR-XR) 75 MG 24 hr capsule Take 75 mg by mouth daily with breakfast.     predniSONE (DELTASONE) 10 MG tablet 4 tabs for 4 days, then 3 tabs for 4 days, 2 tabs for 4 days, then 1 tab for 4 days, then stop (Patient not taking: Reported on 05/25/2023) 40 tablet 0   promethazine-dextromethorphan (PROMETHAZINE-DM) 6.25-15 MG/5ML syrup Take 5 mLs by mouth 4 (four) times daily as  needed for cough. (Patient not taking: Reported on 05/25/2023) 180 mL 0   No current facility-administered medications for this visit.    Allergies as of 05/25/2023 - Review Complete 05/25/2023  Allergen Reaction Noted   Amitriptyline  07/04/2012   Silenor [doxepin hcl]  07/04/2012   Tribenzor [olmesartan-amlodipine-hctz]  07/04/2012   Bupropion  05/26/2022   Dexmethylphenidate  05/26/2022   Rimegepant sulfate  05/26/2022   Topiramate  04/28/2013    Vitals: BP 138/86 (BP Location: Left Arm, Patient Position: Sitting, Cuff Size: Normal)   Pulse 71   Ht 5\' 10"  (1.778 m)   Wt 158 lb (71.7 kg)   BMI 22.67 kg/m  Last Weight:  Wt Readings from Last 1 Encounters:  05/25/23 158 lb (71.7 kg)   Last Height:   Ht Readings from Last 1 Encounters:  05/25/23 5\' 10"  (1.778 m)   Last BMI: @LASTBMI  Physical exam:  General: The patient is awake, alert and appears not in acute distress.  The patient is well groomed. Head: Normocephalic, atraumatic.  Neck is supple.   Neck circumference:16 Cardiovascular:  Regular rate and palpable peripheral pulse:  Respiratory: clear to auscultation.  Mallampati 2, Skin:  Without evidence of edema, or rash Trunk: BMI is 22.67  and patient  has normal posture.   Neurologic exam : The patient is awake and alert, oriented to place and time.   Memory subjective  described as intact.  There is a normal attention span & concentration ability.  Speech is fluent without  dysarthria, dysphonia or aphasia.  Mood and affect are appropriate.  Cranial nerves: Pupils are equal and briskly reactive to light. Funduscopic exam without  evidence  of pallor or edema. Extraocular movements  in vertical and horizontal planes intact and without nystagmus. Visual fields by finger perimetry are intact. Hearing to finger rub intact.  Facial sensation intact to fine touch. Facial motor strength is symmetric and tongue and uvula move midline.  Motor exam:   Normal tone  and normal muscle bulk and symmetric normal strength in all extremities. Grip Strength equal  Proximal strength of shoulder muscles and hip flexors was intact .  Sensory:  Fine touch and vibration were tested . Proprioception was tested in the upper extremities only and was  normal.   Assessment: Total time for face to face interview and examination, for review of  images and laboratory testing, neurophysiology testing and pre-existing records, including out-of -network , was 35 minutes. Assessment is as follows here:  1)  Posttraumatic headaches - half of the head.  HEMICRANIA CONTINUA ?   Right - face, eye tears and face is flushed, mild ptosis.     Plan:  Treatment plan and additional workup planned after today includes:   1)  gabapentin  300 mg tid po.   Voltaren diclofenac (  with food )  ordered for pain relief.  2)  keep trazodone for now  3) MRI brain,  4) acute pain intervention with Nurtec 75 mg , gave samples.   NTERPRETING PHYSICIAN:   Melvyn Novas, MD  [facility] and [facility] Sleep Board certified by The [employer] of Sleep Medicine and Diplomate of the [employer] of Sleep Medicine. Board certified In Neurology through the ABPN, Fellow of the [employer] of Neu[employer].

## 2023-05-31 ENCOUNTER — Telehealth: Payer: Self-pay | Admitting: Neurology

## 2023-05-31 NOTE — Telephone Encounter (Signed)
 [employer] medicaid Berkley Harvey: [id number] exp. 05/31/23-07/15/23 sent to GI [phone]

## 2023-06-02 ENCOUNTER — Other Ambulatory Visit: Payer: Self-pay

## 2023-06-02 ENCOUNTER — Other Ambulatory Visit (HOSPITAL_COMMUNITY): Payer: Self-pay

## 2023-06-02 NOTE — Progress Notes (Signed)
 Specialty Pharmacy Refill Coordination Note  Julian Oneal is a 58 y.o. male contacted today regarding refills of specialty medication(s) Dupilumab (Dupixent)   Patient requested Delivery   Delivery date: 06/11/23   Verified address: [address]   [place] [address]   Medication will be filled on 06/10/23.

## 2023-06-03 ENCOUNTER — Encounter (INDEPENDENT_AMBULATORY_CARE_PROVIDER_SITE_OTHER): Payer: Self-pay | Admitting: Otolaryngology

## 2023-06-03 ENCOUNTER — Ambulatory Visit (INDEPENDENT_AMBULATORY_CARE_PROVIDER_SITE_OTHER): Payer: Medicaid Other | Admitting: Otolaryngology

## 2023-06-03 ENCOUNTER — Telehealth: Payer: Self-pay

## 2023-06-03 ENCOUNTER — Other Ambulatory Visit: Payer: Self-pay

## 2023-06-03 ENCOUNTER — Other Ambulatory Visit: Payer: Self-pay | Admitting: *Deleted

## 2023-06-03 ENCOUNTER — Other Ambulatory Visit (HOSPITAL_COMMUNITY): Payer: Self-pay

## 2023-06-03 VITALS — BP 149/85 | HR 93 | Ht 70.0 in | Wt 145.0 lb

## 2023-06-03 DIAGNOSIS — J383 Other diseases of vocal cords: Secondary | ICD-10-CM | POA: Diagnosis not present

## 2023-06-03 DIAGNOSIS — R0600 Dyspnea, unspecified: Secondary | ICD-10-CM

## 2023-06-03 DIAGNOSIS — R0981 Nasal congestion: Secondary | ICD-10-CM

## 2023-06-03 DIAGNOSIS — J45909 Unspecified asthma, uncomplicated: Secondary | ICD-10-CM | POA: Diagnosis not present

## 2023-06-03 DIAGNOSIS — J343 Hypertrophy of nasal turbinates: Secondary | ICD-10-CM

## 2023-06-03 DIAGNOSIS — K219 Gastro-esophageal reflux disease without esophagitis: Secondary | ICD-10-CM | POA: Diagnosis not present

## 2023-06-03 DIAGNOSIS — R0602 Shortness of breath: Secondary | ICD-10-CM

## 2023-06-03 DIAGNOSIS — R490 Dysphonia: Secondary | ICD-10-CM | POA: Diagnosis not present

## 2023-06-03 DIAGNOSIS — R0982 Postnasal drip: Secondary | ICD-10-CM

## 2023-06-03 DIAGNOSIS — J3089 Other allergic rhinitis: Secondary | ICD-10-CM

## 2023-06-03 MED ORDER — CETIRIZINE HCL 10 MG PO TABS: 10.0000 mg | ORAL_TABLET | Freq: Every day | ORAL | 11 refills | Status: DC

## 2023-06-03 MED ORDER — FLUTICASONE PROPIONATE 50 MCG/ACT NA SUSP: 2.0000 | Freq: Two times a day (BID) | NASAL | 6 refills | Status: AC

## 2023-06-03 MED ORDER — DICLOFENAC SODIUM 50 MG PO TBEC: 50.0000 mg | DELAYED_RELEASE_TABLET | Freq: Two times a day (BID) | ORAL | 0 refills | Status: DC

## 2023-06-03 NOTE — Progress Notes (Signed)
 Clinical Intervention Note  Clinical Intervention Notes: Patient reports starting gabapentin. No DDIs identified with Dupixent.   Clinical Intervention Outcomes: Prevention of an adverse drug event   Otto Herb Specialty Pharmacist

## 2023-06-03 NOTE — Telephone Encounter (Signed)
 Aundra Millet, can you review since you have also seen pt? Dr. Vickey Huger pt

## 2023-06-03 NOTE — Progress Notes (Signed)
 ENT CONSULT:  Reason for Consult: dysphonia, dyspnea concern for VCD   HPI: Discussed the use of AI scribe software for clinical note transcription with the patient, who gave verbal consent to proceed.  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 He was referred by a pulmonary doctor for evaluation of hoarseness and possible vocal cord dysfunction.  He has a history of severe persistent asthma, exacerbated by his previous occupation as a IT sales professional, leading to exposure to smoke and chemicals. He experiences frequent shortness of breath, particularly with minimal exertion, and exposure to smoke, such as from wildfires, immediately triggers hoarseness and requires the use of his rescue inhaler. Despite using albuterol five to six times a day, he finds only minimal relief. He also uses Dupixent, Spiriva, and Advair, which have provided some improvement.  He describes episodes of hoarseness that occur almost every night and have persisted for several years. He has not been allergy tested recently but previously took Zyrtec, Singulair, and Astelin. He has a history of being intubated for surgeries, including arm and foot surgeries, and a colonoscopy. No history of heart problems or smoking as an adult.  He experiences reflux symptoms, for which he takes medication twice daily (PPI). His breathing difficulties are sometimes triggered in the middle of the night, and he has adjusted his diet to avoid spicy foods, which previously exacerbated his reflux. He describes episodes of shortness of breath that occur with exertion, such as climbing stairs, and occasionally at rest, accompanied by chest tightness but no wheezing or noisy breathing unless inhaling deeply.  He has a history of concussions, with a recent CT scan showing no sinusitis. He is scheduled for an MRI next month due to a past  concussion. He has been using a nasal spray for postnasal drainage, which was not refilled by the pharmacy. He currently takes omeprazole for reflux.    Records Reviewed:  Pulm office visit DR Celine Mans 04/12/23 [name] is a 58 y.o. 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 Asthma Symptoms started around 06/01/2001. Started Dupixent November 2024   Interval Updates: Here for follow up. Had Sick visit with Rhunette Croft. Tested positive for Covid. Given prednisone and then called back for a repeat taper. He was taking albuterol 12 times a day and is now down to 5-6 times/day.    He feels like he has trouble getting a deep breath in rather than exhaling. With minimal exertion such as walking through the parking lot.    Current Regimen: 500 advair 1 puff twice daily and spiriva once daily, dupixent  Asthma Triggers: URI, seasonal environmental allergies, exertion.  Exacerbations in the last year: 6-7 times/last 12 months. Last in October 2024 History of hospitalization or intubation: never intubated Allergy Testing: never had GERD: yes on PPI, controlled Allergic Rhinitis: zyrtec, singulair, astelin (not currently taking.)  Continue advair at high dose 1 puff twice daily Continue spiriva inhaler 2 puffs once daily.  Continue albuterol either inhaler or nebulized as needed up to 6 times/day.    Contniue acid reflux medication Resume astelin, cetirizine, montelukast for allergies and asthma   Continue dupixent injections.    I do not think that all of his symptoms are related to asthma - should not be taking albuterol 6-12 times/day for asthma symptoms. Sounds like has difficulty taking a deep breath  in rather than exhaling. Normal pfts. Normal feno. Would explore co-morbid EILO. Will refer to Laryngology/ENT.     Past Medical History:  Diagnosis Date   Bell's palsy 2008   Dr Anne Hahn   Dyspnea 08/28/2014   Followed in Pulmonary  clinic/ [facility]/ [place]  - 08/28/2014  Walked RA x 3 laps @ 185 ft each stopped due to end of study, fast pace, no doe or desats    - trial off acei 08/28/2014 >> improved 10/15/2014  - PFT's  10/21/2014  FEV1 3.40 (87 % ) ratio 88  p 6 % improvement from saba with DLCO  83 % corrects to 124 % for alv volume      Excessive daytime sleepiness 04/04/2015   Headache(784.0)    Headaches, cluster 06/20/2013   High cholesterol    Hypertension    Insomnia    Obstructive sleep apnea 07/05/2012   Patient diagnosed with OSA and using CPAP at 6 cm water , with residual AHI of 0.8 on 07-04-12 . [employer] is his  DME .     Persistent headaches 07/05/2012    [facility] Dr. Neale Burly in 2013 -     Pneumonia, viral 06/20/2013   Taste absent     Past Surgical History:  Procedure Laterality Date   CHOLECYSTECTOMY  2003   FOOT SURGERY  2008   HERNIA REPAIR  [id number]   MOUTH SURGERY      Family History  Problem Relation Age of Onset   Prostate cancer Father    Cancer - Prostate Father    Allergies Sister    Heart disease Maternal Grandmother    Cancer - Prostate Maternal Grandfather    Prostate cancer Maternal Grandfather    COPD Maternal Grandfather    Heart disease Paternal Grandmother    Parkinson's disease Maternal Uncle    Alzheimer's disease Paternal Aunt        in their late 21's   Alzheimer's disease Paternal Uncle        in their late 6's    Social History:  reports that he quit smoking about 33 years ago. His smoking use included cigarettes. He started smoking about 38 years ago. He has a 1.3 pack-year smoking history. He has never used smokeless tobacco. He reports that he does not currently use alcohol. He reports that he does not use drugs.  Allergies:  Allergies  Allergen Reactions   Amitriptyline     headaches   Silenor [Doxepin Hcl]     headaches   Tribenzor [Olmesartan-Amlodipine-Hctz]     headaches   Bupropion     Other Reaction(s): tremors    Dexmethylphenidate     Other Reaction(s): headaches   Rimegepant Sulfate     Other Reaction(s): ineffective   Topiramate     Severe headaches, memory loss    Medications: I have reviewed the patient's current medications.  The PMH, PSH, Medications, Allergies, and SH were reviewed and updated.  ROS: Constitutional: Negative for fever, weight loss and weight gain. Cardiovascular: Negative for chest pain and dyspnea on exertion. Respiratory: Is not experiencing shortness of breath at rest. Gastrointestinal: Negative for nausea and vomiting. Neurological: Negative for headaches. Psychiatric: The patient is not nervous/anxious  Blood pressure (!) 149/85, pulse 93, height 5\' 10"  (1.778 m), weight 145 lb (65.8 kg), SpO2 97%. Body mass index is 20.81 kg/m.  PHYSICAL EXAM:  Exam: General: Well-developed, well-nourished Communication and Voice: raspy Respiratory Respiratory effort: Equal inspiration and expiration without  stridor Cardiovascular Peripheral Vascular: Warm extremities with equal color/perfusion Eyes: No nystagmus with equal extraocular motion bilaterally Neuro/Psych/Balance: Patient oriented to person, place, and time; Appropriate mood and affect; Gait is intact with no imbalance; Cranial nerves I-XII are intact Head and Face Inspection: Normocephalic and atraumatic without mass or lesion Palpation: Facial skeleton intact without bony stepoffs Salivary Glands: No mass or tenderness Facial Strength: Facial motility symmetric and full bilaterally ENT Pinna: External ear intact and fully developed External canal: Canal is patent with intact skin Tympanic Membrane: Clear and mobile External Nose: No scar or anatomic deformity Internal Nose: Septum is deviated to the left. No polyp, or purulence. Mucosal edema and erythema present.  Bilateral inferior turbinate hypertrophy.  Lips, Teeth, and gums: Mucosa and teeth intact and viable TMJ: No pain to palpation with full  mobility Oral cavity/oropharynx: No erythema or exudate, no lesions present Nasopharynx: No mass or lesion with intact mucosa Hypopharynx: Intact mucosa without pooling of secretions PND Larynx Glottic: Full true vocal cord mobility without lesion or mass VF atrophy  Supraglottic: Normal appearing epiglottis and AE folds Interarytenoid Space: Moderate pachydermia&edema Subglottic Space: Patent without lesion or edema Neck Neck and Trachea: Midline trachea without mass or lesion Thyroid: No mass or nodularity Lymphatics: No lymphadenopathy  Procedure: Preoperative diagnosis: dysphonia dyspnea throat tightness concern for VCD  Postoperative diagnosis:   Same + GERD LPR and post-nasal drainage  + VF atrophy and glottic insufficiency  Procedure: Flexible fiberoptic laryngoscopy  Surgeon: Ashok Croon, MD  Anesthesia: Topical lidocaine and Afrin Complications: None Condition is stable throughout exam  Indications and consent:  The patient presents to the clinic with above symptoms. Indirect laryngoscopy view was incomplete. Thus it was recommended that they undergo a flexible fiberoptic laryngoscopy. All of the risks, benefits, and potential complications were reviewed with the patient preoperatively and verbal informed consent was obtained.  Procedure: The patient was seated upright in the clinic. Topical lidocaine and Afrin were applied to the nasal cavity. After adequate anesthesia had occurred, I then proceeded to pass the flexible telescope into the nasal cavity. The nasal cavity was patent without rhinorrhea or polyp. The nasopharynx was also patent without mass or lesion. The base of tongue was visualized and was normal. There were no signs of pooling of secretions in the piriform sinuses. The true vocal folds were mobile bilaterally. There were no signs of glottic or supraglottic mucosal lesion or mass. There was moderate interarytenoid pachydermia and post cricoid edema. The  telescope was then slowly withdrawn and the patient tolerated the procedure throughout.      PROCEDURE NOTE: nasal endoscopy  Preoperative diagnosis: chronic nasal congestion symptoms  Postoperative diagnosis: same  Procedure: Diagnostic nasal endoscopy (47829)  Surgeon: Ashok Croon, M.D.  Anesthesia: Topical lidocaine and Afrin  H&P REVIEW: The patient's history and physical were reviewed today prior to procedure. All medications were reviewed and updated as well. Complications: None Condition is stable throughout exam Indications and consent: The patient presents with symptoms of chronic sinusitis not responding to previous therapies. All the risks, benefits, and potential complications were reviewed with the patient preoperatively and informed consent was obtained. The time out was completed with confirmation of the correct procedure.   Procedure: The patient was seated upright in the clinic. Topical lidocaine and Afrin were applied to the nasal cavity. After adequate anesthesia had occurred, the rigid nasal endoscope was passed into the nasal cavity. The nasal mucosa, turbinates, septum, and sinus drainage pathways were visualized bilaterally. This revealed no  purulence or significant secretions that might be cultured. There were no polyps or sites of significant inflammation. The mucosa was intact and there was no crusting present. The scope was then slowly withdrawn and the patient tolerated the procedure well. There were no complications or blood loss.   Studies Reviewed: CT max/face 04/21/23 CLINICAL DATA:  Head and facial trauma with neck pain. Abrasion to right cheek, right-sided head. Patient does not recall the trauma but believes the permanently fell down.  Sinuses: There is mild membrane thickening in the maxillary and ethmoid sinuses and middle locule of the frontal sinus. The sphenoid sinus, mastoid air cells and middle ears are clear.   Nasal septum deviates  slightly to the left with mild membrane disease in the nasal passages and intact turbinates. Both ostiomeatal complexes are patent.   Soft tissues: There is slight swelling in the right cheek. No other focal soft tissue abnormality. There is fatty replacement of both parotid glands.    Assessment/Plan: Encounter Diagnoses  Name Primary?   Hoarseness Yes   Shortness of breath    Chronic GERD    Environmental and seasonal allergies    Post-nasal drip    Chronic nasal congestion    Hypertrophy of both inferior nasal turbinates    Vocal fold dysfunction    Age-related vocal fold atrophy    Glottic insufficiency    Dysphonia     Assessment and Plan Assessment & Plan Episodes of dyspnea with exertion Vocal cord dysfunction suspected  Suspected paradoxical vocal fold movement disorder 2/2 poorly controlled asthma and no relief with albuterol when episodes occur. He is on daily inhalers as well, and on Dupixent. Flexible scope exam without adduction on inspiration even with provoking maneuvers, no lesions, and clear subglottis proximal trachea. He did have post-nasal drainage and findings c/w GERD LPR. He also had VF atrophy, likely age-related. We discussed that VCD is difficult to diagnose, and it often coexists with asthma. We also discussed that his sx could be related to asthma.  - Refer to speech therapy for breathing techniques to manage episodes of potential VCD and possible voice therapy 2/2 VF atrophy.  Hoarseness/Dysphonia Chronic hoarseness likely due to age-related vocal fold atrophy seen on scope exam today, postnasal drainage, and reflux. No vocal cord lesions. Voice therapy may improve vocal quality.  - Refer to speech therapy for voice therapy to improve vocal quality.  GERD LPR Suspected based on hx of GERD and exam findings. Nocturnal reflux symptoms present per patient report. - Continue omeprazole twice daily. - Recommend Reflux Gourmet supplement. - Provide  dietary and lifestyle advice for reflux management.  Chronic nasal congestion and post-nasal drainage Evidence of post-nasal drainage during nasal scope exam today, could be contributing to her sx - trial of Zyrtec 10 mg daily and Flonase 2 puffs b/l nares BID - consider nasal saline rinses   Asthma Severe persistent asthma with exertional dyspnea. Limited relief from inhalers. Dupixent has provided some improvement.  - Continue current asthma management with inhalers and Dupixent. - Follow up with pulmonologist for ongoing management.  Follow-up - Follow up with pulmonologist, Dr. Celine Mans. - Return to otolaryngology as needed for voice concerns. - speech therapy for VCD   Thank you for allowing me to participate in the care of this patient. Please do not hesitate to contact me with any questions or concerns.   Ashok Croon, MD Otolaryngology [facility] Phone: [phone] Fax: [phone]    06/03/2023, 3:39 PM

## 2023-06-03 NOTE — Patient Instructions (Signed)

## 2023-06-03 NOTE — Telephone Encounter (Addendum)
   SO CMM was making it seem like Brand was preferred, however per their new formulary it and the generic are non preferred. Looks like they prefer Celecoxib capsule, Ibuprofen, Indomethacin capsule, ketorolac tablet, meloxicam tablet, naproxen EC/DR tablet, Naproxen tablet, Naproxen sodium tablet. Apparently after some investigation BRAND Voltaren is not available. Medicaid prefers PT to try the preferred meds first. Please advise-

## 2023-06-03 NOTE — Telephone Encounter (Signed)
   Looks like Medicaid prefers the [place] and not generic. Please advise-

## 2023-06-03 NOTE — Telephone Encounter (Signed)
 Thank you, sent in new rx for brand name instead.

## 2023-06-03 NOTE — Telephone Encounter (Signed)
 Called pt. He was agreeable to using goodrx coupon to fill rx instead to help w/ cost.  I emailed him coupon at: [email or web address].

## 2023-06-10 ENCOUNTER — Other Ambulatory Visit: Payer: Self-pay

## 2023-06-21 ENCOUNTER — Ambulatory Visit: Attending: Otolaryngology

## 2023-06-21 ENCOUNTER — Telehealth: Payer: Self-pay | Admitting: Speech Pathology

## 2023-06-21 ENCOUNTER — Other Ambulatory Visit: Payer: Self-pay

## 2023-06-21 DIAGNOSIS — R41841 Cognitive communication deficit: Secondary | ICD-10-CM | POA: Insufficient documentation

## 2023-06-21 DIAGNOSIS — R490 Dysphonia: Secondary | ICD-10-CM | POA: Diagnosis present

## 2023-06-21 DIAGNOSIS — J383 Other diseases of vocal cords: Secondary | ICD-10-CM | POA: Diagnosis not present
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
## 2023-06-21 NOTE — Patient Instructions (Signed)
   Have Dr. Leighton Punches send a prescription for post concussion syndrome to OT at [facility].  I will contact the supervisor at [facility] for Speech therapy for you to work on voice therapy over there as well. They will contact you to schedule your therapy over there.

## 2023-06-21 NOTE — Telephone Encounter (Signed)
 Called and LVM for patietnt to call back and schedule followup appointments with Happi at [facility]. Left our phone number on his VM.

## 2023-06-29 NOTE — Progress Notes (Unsigned)
 Subjective:   I, Carol Chroman, PhD, LAT, ATC acting as a scribe for Garlan Juniper, MD.  Chief Complaint: Julian Oneal,  is a 58 y.o. male who presents for persistent HA. ???***initial injury was on 04/21/23 after suffering a fall while wondering around his house in the middle of the night. Pt was seen at the ED the following day. +LOC. Pt c/o cont'd  Pt was previously seen by Neurology on 05/25/23 and was prescribed oral diclofenac , gabapentin , and nurtec.  Injury date: *** Visit #: 1  History of Present Illness:   Concussion Self-Reported Symptom Score Symptoms rated on a scale 1-6, in last 24 hours  Headache: ***   Pressure in head: *** Neck pain: *** Nausea or vomiting: *** Dizziness: ***  Blurred vision: ***  Balance problems: *** Sensitivity to light:  *** Sensitivity to noise: *** Feeling slowed down: *** Feeling like "in a fog": *** "Don't feel right": *** Difficulty concentrating: *** Difficulty remembering: *** Fatigue or low energy: *** Confusion: *** Drowsiness: *** More emotional: *** Irritability: *** Sadness: *** Nervous or anxious: *** Trouble falling asleep: ***   Total # of Symptoms: ***/22 Total Symptom Score: ***/132  Tinnitus: Yes/No***  Review of Systems:  ***    Review of History: ***  Objective:    Physical Examination There were no vitals filed for this visit. MSK:  *** Neuro: *** Psych: ***     Imaging:  ***  Assessment and Plan   58 y.o. male with ***    ***    Action/Discussion: Reviewed diagnosis, management options, expected outcomes, and the reasons for scheduled and emergent follow-up. Questions were adequately answered. Patient expressed verbal understanding and agreement with the following plan.     Patient Education: Reviewed with patient the risks (i.e, a repeat concussion, post-concussion syndrome, second-impact syndrome) of returning to play prior to complete resolution, and thoroughly reviewed the signs  and symptoms of concussion.Reviewed need for complete resolution of all symptoms, with rest AND exertion, prior to return to play. Reviewed red flags for urgent medical evaluation: worsening symptoms, nausea/vomiting, intractable headache, musculoskeletal changes, focal neurological deficits. [facility] Concussion Care Plan, which clearly outlines the plans stated above, was given to patient.   Level of service: ***     After Visit Summary printed out and provided to patient as appropriate.  The above documentation has been reviewed and is accurate and complete Mena Stai

## 2023-06-30 ENCOUNTER — Encounter: Payer: Self-pay | Admitting: Family Medicine

## 2023-06-30 ENCOUNTER — Ambulatory Visit (INDEPENDENT_AMBULATORY_CARE_PROVIDER_SITE_OTHER): Admitting: Family Medicine

## 2023-06-30 VITALS — BP 142/80 | HR 86 | Ht 70.0 in | Wt 156.0 lb

## 2023-06-30 DIAGNOSIS — R413 Other amnesia: Secondary | ICD-10-CM | POA: Diagnosis not present

## 2023-06-30 DIAGNOSIS — R519 Headache, unspecified: Secondary | ICD-10-CM | POA: Diagnosis not present

## 2023-06-30 DIAGNOSIS — F0781 Postconcussional syndrome: Secondary | ICD-10-CM | POA: Insufficient documentation

## 2023-06-30 DIAGNOSIS — G44301 Post-traumatic headache, unspecified, intractable: Secondary | ICD-10-CM

## 2023-06-30 MED ORDER — INDOMETHACIN 25 MG PO CAPS: ORAL_CAPSULE | ORAL | 0 refills | Status: AC

## 2023-06-30 NOTE — Patient Instructions (Addendum)
 Thank you for coming in today.   Try indomethacin  for a few days as I wrote it.   STOP diclofenac  while taking this medicine.   Recheck in 3 weeks.   Let me know when you get that MRI results back.   I am considering ADHD medicines like Adderall or Ritalin type meds.

## 2023-07-01 HISTORY — PX: HAND SURGERY: SHX662

## 2023-07-04 ENCOUNTER — Ambulatory Visit
Admission: RE | Admit: 2023-07-04 | Discharge: 2023-07-04 | Disposition: A | Discharge status: Home / Self Care | Source: Ambulatory Visit | Attending: Neurology | Admitting: Neurology

## 2023-07-04 DIAGNOSIS — G4700 Insomnia, unspecified: Secondary | ICD-10-CM

## 2023-07-04 DIAGNOSIS — R519 Headache, unspecified: Secondary | ICD-10-CM | POA: Diagnosis not present

## 2023-07-04 DIAGNOSIS — G4451 Hemicrania continua: Secondary | ICD-10-CM

## 2023-07-04 MED ORDER — GADOPICLENOL 0.5 MMOL/ML IV SOLN
7.0000 mL | Freq: Once | INTRAVENOUS | Status: AC | PRN
  Administered 2023-07-04: 7 mL via INTRAVENOUS

## 2023-07-05 ENCOUNTER — Encounter: Payer: Self-pay | Admitting: Neurology

## 2023-07-07 ENCOUNTER — Ambulatory Visit: Payer: Medicaid Other | Admitting: Internal Medicine

## 2023-07-07 ENCOUNTER — Encounter: Payer: Self-pay | Admitting: Internal Medicine

## 2023-07-07 VITALS — BP 148/80 | HR 83 | Ht 70.0 in | Wt 159.0 lb

## 2023-07-07 DIAGNOSIS — J383 Other diseases of vocal cords: Secondary | ICD-10-CM | POA: Diagnosis not present

## 2023-07-07 DIAGNOSIS — Z87891 Personal history of nicotine dependence: Secondary | ICD-10-CM | POA: Diagnosis not present

## 2023-07-07 DIAGNOSIS — R490 Dysphonia: Secondary | ICD-10-CM | POA: Diagnosis not present

## 2023-07-07 DIAGNOSIS — X088XXD Exposure to other specified smoke, fire and flames, subsequent encounter: Secondary | ICD-10-CM

## 2023-07-07 DIAGNOSIS — J4550 Severe persistent asthma, uncomplicated: Secondary | ICD-10-CM

## 2023-07-07 NOTE — Patient Instructions (Signed)
 It was a pleasure to see you today!  Please schedule follow up with myself in 6 months.  If my schedule is not open yet, we will contact you with a reminder closer to that time. Please call [phone] if you haven't heard from us  a month before, and always call us  sooner if issues or concerns arise. You can also send us  a message through MyChart, but but aware that this is not to be used for urgent issues and it may take up to 5-7 days to receive a reply. Please be aware that you will likely be able to view your results before I have a chance to respond to them. Please give us  5 business days to respond to any non-urgent results.    Continue advair at high dose 1 puff twice daily Continue spiriva  inhaler 2 puffs once daily.  Continue albuterol  either inhaler or nebulized as needed up to 4 times/day.   Continue acid reflux medication Take astelin, cetirizine , montelukast  for allergies and asthma  Continue dupixent  injections. Sounds like this is helping because he has not needed any prednisone  since starting this.   Unfortunately air supra not covered by medicaid. When you are feeling up for it I think going back to speech therapy would be helpful in getting control of your breathing.

## 2023-07-07 NOTE — Progress Notes (Signed)
 Julian Oneal    [id number]    07-12-1965  Primary Care Physician:Shaw, Jerlean Mood, MD Date of Appointment: 07/07/2023 Established Patient Visit  Chief complaint:   Chief Complaint  Patient presents with   Follow-up    Breathing has been progressively worse since the last visit. He has some chest tightness, wheezing- using albuterol  inhaler 4-5 x per day.     HPI: Julian Oneal is a 58 y.o. 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 Asthma Symptoms started around 06/01/2001. Started Dupixent  November 2024  Interval Updates: Here for follow up after seeing ENT and speech therapy. He doesn't remember much about these appointments because He had a fall in February and now has a concussion. He was wandering around the house in the middle of the night after taking too much medication. He is focusing on his concussion right now.  He continues to have chest tightness not consistently relieved by albuterol .  His breathing gets worse as the day goes on. Has dyspnea with minimal exertion not. He says at night he can barely talk. He denies gasping for air. Hard to get a deep breath in.   He is thinking about applying for disability.  He is able to sit but he is not able to stand due to leg pain from fall. He is not able to consistently talk because of his shortness of breath and voice issues.   No interval prednisone  exacerbations since Nov 2024 since starting dupixent . Taking albuterol  5 times a day.    Current Regimen: 500 advair 1 puff twice daily and spiriva  once daily, dupixent   Asthma Triggers: URI, seasonal environmental allergies, exertion.  Exacerbations in the last year: 6-7 times/last 12 months. Last in October 2024. No prednisone  since starting dupixent . History of hospitalization or intubation: never intubated Allergy  Testing: never had GERD: yes on PPI, controlled Allergic Rhinitis: zyrtec , singulair , astelin (not  currently taking.) ACT:  Asthma Control Test ACT Total Score  07/07/2023  8:38 AM 9  11/12/2022  8:38 AM 6  05/26/2022  2:20 PM 9   FeNO: 15 ppb  I have reviewed the patient's family social and past medical history and updated as appropriate.   Past Medical History:  Diagnosis Date   Bell's palsy 2008   Dr Tilda Fogo   Dyspnea 08/28/2014   Followed in Pulmonary clinic/ [facility]/ [place]  - 08/28/2014  Walked RA x 3 laps @ 185 ft each stopped due to end of study, fast pace, no doe or desats    - trial off acei 08/28/2014 >> improved 10/15/2014  - PFT's  10/21/2014  FEV1 3.40 (87 % ) ratio 88  p 6 % improvement from saba with DLCO  83 % corrects to 124 % for alv volume      Excessive daytime sleepiness 04/04/2015   Headache(784.0)    Headaches, cluster 06/20/2013   High cholesterol    Hypertension    Insomnia    Obstructive sleep apnea 07/05/2012   Patient diagnosed with OSA and using CPAP at 6 cm water , with residual AHI of 0.8 on 07-04-12 . [employer] is his  DME .     Persistent headaches 07/05/2012    [facility] Dr. Margie Sheller in 2013 -     Pneumonia, viral 06/20/2013   Taste absent     Past Surgical History:  Procedure Laterality Date   CHOLECYSTECTOMY  2003   FOOT SURGERY  2008   HERNIA REPAIR  [id number]   MOUTH SURGERY      Family History  Problem Relation Age of Onset   Prostate cancer Father    Cancer - Prostate Father    Allergies Sister    Heart disease Maternal Grandmother    Cancer - Prostate Maternal Grandfather    Prostate cancer Maternal Grandfather    COPD Maternal Grandfather    Heart disease Paternal Grandmother    Parkinson's disease Maternal Uncle    Alzheimer's disease Paternal Aunt        in their late 81's   Alzheimer's disease Paternal Uncle        in their late 8's    Social History   Occupational History   Occupation: fireman    Comment: Neurosurgeon: [employer]   Tobacco Use   Smoking status: Former    Current packs/day: 0.00    Average packs/day: 0.3 packs/day for 5.0 years (1.3 ttl pk-yrs)    Types: Cigarettes    Start date: 03/02/1985    Quit date: 03/02/1990    Years since quitting: 33.3   Smokeless tobacco: Never  Vaping Use   Vaping status: Never Used  Substance and Sexual Activity   Alcohol use: Not Currently    Comment: occasionally   Drug use: No   Sexual activity: Not on file     Physical Exam: Blood pressure (!) 148/80, pulse 83, height 5\' 10"  (1.778 m), weight 159 lb (72.1 kg), SpO2 97%.   Gen: fatigued, no distress Resp: ctab no wheeze, no respiratory distress CV: RRR no mrg Ext: no edema  Data Reviewed: Imaging: I have personally reviewed the CT Chest dec 2024. Negative for PE, no pulmonary disease  PFTs: PFTs reviewed - normal pulmonary function 2024    Latest Ref Rng & Units 08/26/2022    2:00 PM 10/15/2014    9:55 AM  PFT Results  FVC-Pre L  3.83   FVC-Predicted Pre % 70  76   FVC-Post L 3.37  3.88   FVC-Predicted Post % 68  77   Pre FEV1/FVC % % 85  82   Post FEV1/FCV % % 86  88   FEV1-Pre L 2.93  3.12   FEV1-Predicted Pre % 78  80   FEV1-Post L 2.91  3.40   DLCO uncorrected ml/min/mmHg 29.25  26.46   DLCO UNC% % 103  83   DLCO corrected ml/min/mmHg 29.25    DLCO COR %Predicted % 103    DLVA Predicted % 117  124   TLC L 5.11  5.01   TLC % Predicted % 73  73   RV % Predicted % 101  72     Labs: Lab Results  Component Value Date   NA 136 04/21/2023   K 3.4 (L) 04/21/2023   CO2 25 04/21/2023   GLUCOSE 110 (H) 04/21/2023   BUN 18 04/21/2023   CREATININE 0.96 04/21/2023   CALCIUM 9.2 04/21/2023   GFRNONAA >60 04/21/2023   Lab Results  Component Value Date   WBC 6.0 04/21/2023   HGB 13.4 04/21/2023   HCT 38.3 (L) 04/21/2023   MCV 87.6 04/21/2023   PLT 170 04/21/2023    Immunization status: Immunization History  Administered Date(s) Administered   Influenza Inj Mdck Quad Pf 12/30/2016,  11/30/2017   Influenza Split 11/30/2013   Influenza, Seasonal, Injecte, Preservative Fre 11/12/2022   Influenza-Unspecified 12/01/2018, 10/31/2021  Tdap 03/24/2014    External Records Personally Reviewed: family medicine, ED, ENT, SLP, neurology  Assessment:  Severe persistent asthma not well controlled Allergic rhinitis controlled GERD, on PPI, controlled  Plan/Recommendations:  Continue advair at high dose 1 puff twice daily Continue spiriva  inhaler 2 puffs once daily.  Continue albuterol  either inhaler or nebulized as needed up to 4 times/day.   Continue acid reflux medication Take astelin, cetirizine , montelukast  for allergies and asthma  Continue dupixent  injections. Sounds like this is helping because he has not needed any prednisone  since starting this.   Unfortunately air supra not covered by medicaid. When you are feeling up for it I think going back to speech therapy would be helpful in getting control of your breathing.  He is having difficulty holding employment due to his severe asthma as well as issues related to fall resulting in concussion, chronic pain limiting ability to stand, talk, push, pull and carry things.   I spent 40 minutes in the care of this patient today including pre-charting, chart review, review of results, face-to-face care, coordination of care and communication with consultants etc.).  Return to Care: Return in about 6 months (around 01/07/2024).   Louie Rover, MD Pulmonary and Critical Care Medicine [facility] [phone]

## 2023-07-12 ENCOUNTER — Emergency Department

## 2023-07-12 ENCOUNTER — Emergency Department
Admission: EM | Admit: 2023-07-12 | Discharge: 2023-07-12 | Disposition: A | Discharge status: Home / Self Care | Attending: Emergency Medicine | Admitting: Emergency Medicine

## 2023-07-12 ENCOUNTER — Encounter: Payer: Self-pay | Admitting: Emergency Medicine

## 2023-07-12 ENCOUNTER — Other Ambulatory Visit: Payer: Self-pay

## 2023-07-12 DIAGNOSIS — I10 Essential (primary) hypertension: Secondary | ICD-10-CM | POA: Insufficient documentation

## 2023-07-12 DIAGNOSIS — M79644 Pain in right finger(s): Secondary | ICD-10-CM | POA: Diagnosis present

## 2023-07-12 DIAGNOSIS — S6992XA Unspecified injury of left wrist, hand and finger(s), initial encounter: Secondary | ICD-10-CM | POA: Insufficient documentation

## 2023-07-12 DIAGNOSIS — S62640A Nondisplaced fracture of proximal phalanx of right index finger, initial encounter for closed fracture: Secondary | ICD-10-CM | POA: Diagnosis not present

## 2023-07-12 DIAGNOSIS — W109XXA Fall (on) (from) unspecified stairs and steps, initial encounter: Secondary | ICD-10-CM | POA: Insufficient documentation

## 2023-07-12 DIAGNOSIS — W19XXXA Unspecified fall, initial encounter: Secondary | ICD-10-CM

## 2023-07-12 DIAGNOSIS — R519 Headache, unspecified: Secondary | ICD-10-CM | POA: Insufficient documentation

## 2023-07-12 MED ORDER — KETOROLAC TROMETHAMINE 30 MG/ML IJ SOLN
30.0000 mg | Freq: Once | INTRAMUSCULAR | Status: AC
  Administered 2023-07-12: 30 mg via INTRAMUSCULAR
  Filled 2023-07-12: qty 1

## 2023-07-12 MED ORDER — OXYCODONE-ACETAMINOPHEN 5-325 MG PO TABS
1.0000 | ORAL_TABLET | Freq: Once | ORAL | Status: AC
  Administered 2023-07-12: 1 via ORAL
  Filled 2023-07-12: qty 1

## 2023-07-12 MED ORDER — OXYCODONE-ACETAMINOPHEN 5-325 MG PO TABS: 1.0000 | ORAL_TABLET | ORAL | 0 refills | Status: DC | PRN

## 2023-07-12 NOTE — ED Provider Notes (Signed)
 [facility] Provider Note    Event Date/Time   First MD Initiated Contact with Patient 07/12/23 [phone]     (approximate)   History   Chief Complaint Fall   HPI  NICKALOUS BARBA is a 58 y.o. male with past medical history of hypertension, hyperlipidemia, and anxiety who presents to the ED complaining of fall.  Patient reports that he fell last night around 2 AM after he tried to get up to go to the bathroom.  He states that he opened the door to the stairs when he meant to open the door to the bathroom.  He believes he fell down about 12 steps and struck his head, is unsure whether he lost consciousness.  He has had significant headache, neck pain, right hand pain, left shoulder pain, and right foot pain since the fall.  He does not take any blood thinners, denies any chest pain or abdominal pain.  He has been ambulatory since the fall.  He does state that he takes Xanax, trazodone , and gabapentin  before bed, which tends to make him "woozy" in the middle of the night.     Physical Exam   Triage Vital Signs: ED Triage Vitals  Encounter Vitals Group     BP 07/12/23 0853 (!) 157/97     Systolic BP Percentile --      Diastolic BP Percentile --      Pulse Rate 07/12/23 0853 75     Resp 07/12/23 0853 18     Temp 07/12/23 0853 97.7 F (36.5 C)     Temp Source 07/12/23 0853 Oral     SpO2 07/12/23 0853 100 %     Weight 07/12/23 0854 145 lb (65.8 kg)     Height 07/12/23 0854 5\' 10"  (1.778 m)     Head Circumference --      Peak Flow --      Pain Score 07/12/23 0853 10     Pain Loc --      Pain Education --      Exclude from Growth Chart --     Most recent vital signs: Vitals:   07/12/23 0853  BP: (!) 157/97  Pulse: 75  Resp: 18  Temp: 97.7 F (36.5 C)  SpO2: 100%    Constitutional: Alert and oriented. Eyes: Conjunctivae are normal. Head: Atraumatic. Nose: No congestion/rhinnorhea. Mouth/Throat: Mucous membranes are moist.  Neck: Midline cervical  spine tenderness to palpation noted. Cardiovascular: Normal rate, regular rhythm. Grossly normal heart sounds.  2+ radial and DP pulses bilaterally. Respiratory: Normal respiratory effort.  No retractions. Lungs CTAB.  No chest wall tenderness to palpation. Gastrointestinal: Soft and nontender. No distention. Musculoskeletal: Diffuse tenderness to palpation of right hand and wrist with associated edema and ecchymosis.  Tenderness to palpation noted throughout left upper extremity with no obvious deformity.  Tenderness to palpation noted at right foot with no tenderness over the ankle.  Tenderness to palpation diffusely at right knee with no obvious deformity.  Pain upon range of motion of left hip with no obvious deformity. Neurologic:  Normal speech and language. No gross focal neurologic deficits are appreciated.    ED Results / Procedures / Treatments   Labs (all labs ordered are listed, but only abnormal results are displayed) Labs Reviewed - No data to display   RADIOLOGY CT head reviewed and interpreted by me with no hemorrhage or midline shift.  PROCEDURES:  Critical Care performed: No  Procedures   MEDICATIONS ORDERED IN ED:  Medications  oxyCODONE -acetaminophen  (PERCOCET/ROXICET) 5-325 MG per tablet 1 tablet (1 tablet Oral Given 07/12/23 0920)  ketorolac  (TORADOL ) 30 MG/ML injection 30 mg (30 mg Intramuscular Given 07/12/23 1016)  oxyCODONE -acetaminophen  (PERCOCET/ROXICET) 5-325 MG per tablet 1 tablet (1 tablet Oral Given 07/12/23 1231)     IMPRESSION / MDM / ASSESSMENT AND PLAN / ED COURSE  I reviewed the triage vital signs and the nursing notes.                              58 y.o. male with past medical history of hypertension, hyperlipidemia, and anxiety who presents to the ED complaining of fall last night around 2 AM down about 12 steps.  Patient's presentation is most consistent with acute presentation with potential threat to life or bodily  function.  Differential diagnosis includes, but is not limited to, intracranial injury, cervical spine injury, fracture, dislocation, contusion.  Patient uncomfortable but nontoxic-appearing and in no acute distress, vital signs are unremarkable.  We will check CT head and cervical spine, also check x-rays of multiple areas of his extremities given numerous areas of tenderness, however no obvious deformities noted.  We will treat symptomatically with Percocet and reassess following imaging.  CT head and cervical spine are negative for acute process, x-ray imaging remarkable for proximal phalanx fracture of the right index finger as well as possible triquetral fracture of the left wrist.  Pain improved following IM Toradol  and Percocet, patient placed in left wrist brace and right index finger splint.  He is appropriate for discharge home with outpatient orthopedic follow-up, referral provided to hand specialist.  He was counseled to return to the ED for new or worsening symptoms, patient agrees with plan.      FINAL CLINICAL IMPRESSION(S) / ED DIAGNOSES   Final diagnoses:  Fall, initial encounter  Closed nondisplaced fracture of proximal phalanx of right index finger, initial encounter  Injury of left wrist, initial encounter     Rx / DC Orders   ED Discharge Orders          Ordered    oxyCODONE -acetaminophen  (PERCOCET) 5-325 MG tablet  Every 4 hours PRN        07/12/23 1400             Note:  This document was prepared using Dragon voice recognition software and may include unintentional dictation errors.   Twilla Galea, MD 07/12/23 [phone]

## 2023-07-12 NOTE — ED Notes (Signed)
 Patient transported to X-ray

## 2023-07-12 NOTE — ED Notes (Signed)
 Patient transported to CT

## 2023-07-12 NOTE — ED Triage Notes (Signed)
 Pt to ED via POV for a fall. Pt states that he got up in the middle of the night to use the bathroom and fell down the steps. Per pt he fell down 10-12 steps. Pt having pain all over. Pt has swelling in his right hand. Pt states that he has the most pain in the right hand, left shoulder, and right knee and foot. Pt is not on blood thinners. Pts states that he did not have LOC but thinks he did hit his head.

## 2023-07-13 ENCOUNTER — Other Ambulatory Visit: Payer: Self-pay

## 2023-07-13 NOTE — Progress Notes (Signed)
 Specialty Pharmacy Refill Coordination Note  Julian Oneal is a 58 y.o. male contacted today regarding refills of specialty medication(s) Dupilumab  (Dupixent )   Patient requested Delivery   Delivery date: 07/21/23   Verified address: [address]   [place] [address]   Medication will be filled on 07/20/23.

## 2023-07-13 NOTE — Progress Notes (Signed)
 Specialty Pharmacy Ongoing Clinical Assessment Note  ONECIMO PURCHASE is a 58 y.o. male who is being followed by the specialty pharmacy service for RxSp Asthma/COPD   Patient's specialty medication(s) reviewed today: Dupilumab  (Dupixent )   Missed doses in the last 4 weeks: 0   Patient/Caregiver did not have any additional questions or concerns.   Therapeutic benefit summary: Patient is achieving benefit   Adverse events/side effects summary: No adverse events/side effects   Patient's therapy is appropriate to: Continue    Goals Addressed             This Visit's Progress    Maintain optimal adherence to therapy   On track    Patient is on track. Patient will maintain adherence and adhere to provider and/or lab appointments      Minimize recurrence of flares   On track    Patient is on track. Patient will avoid flare triggers         Follow up: 6 months  [facility] Specialty Pharmacist

## 2023-07-20 ENCOUNTER — Other Ambulatory Visit (HOSPITAL_COMMUNITY): Payer: Self-pay

## 2023-07-20 ENCOUNTER — Other Ambulatory Visit: Payer: Self-pay

## 2023-07-20 NOTE — Progress Notes (Deleted)
 Subjective:   I, Carol Chroman, PhD, LAT, ATC acting as a scribe for Garlan Juniper, MD.  Chief Complaint: Julian Oneal,  is a 58 y.o. male who presents for f/u post-concussion syndrome and a new head injury on 5/12. Pt was last seen by Dr. Alease Hunter on 06/30/23 and was advised to d/c diclofenac  and prescribed indomethacin .   Today, pt reports he suffered another fall on 5/12 when trying to use the restroom in the middle of the night, falling down 10-12 stairs. He was seen later at the ED. Pt c/o cont'd ***  Injury date: 04/21/23 & 07/12/23 Visit #: 2  History of Present Illness:   Concussion Self-Reported Symptom Score Symptoms rated on a scale 1-6, in last 24 hours  Headache: ***   Pressure in head: *** Neck pain: *** Nausea or vomiting: *** Dizziness: ***  Blurred vision: ***  Balance problems: *** Sensitivity to light:  *** Sensitivity to noise: *** Feeling slowed down: *** Feeling like "in a fog": *** "Don't feel right": *** Difficulty concentrating: *** Difficulty remembering: *** Fatigue or low energy: *** Confusion: *** Drowsiness: *** More emotional: *** Irritability: *** Sadness: *** Nervous or anxious: *** Trouble falling asleep: ***   Total # of Symptoms: ***/22 Total Symptom Score: ***/132  Previous Total # of Symptoms: 20/22 Previous Symptom Score: 87/132  Tinnitus: Yes/No***  Review of Systems:  ***    Review of History: ***  Objective:    Physical Examination There were no vitals filed for this visit. MSK:  *** Neuro: *** Psych: ***     Imaging:  ***  Assessment and Plan   58 y.o. male with ***    ***    Action/Discussion: Reviewed diagnosis, management options, expected outcomes, and the reasons for scheduled and emergent follow-up. Questions were adequately answered. Patient expressed verbal understanding and agreement with the following plan.     Patient Education: Reviewed with patient the risks (i.e, a repeat concussion,  post-concussion syndrome, second-impact syndrome) of returning to play prior to complete resolution, and thoroughly reviewed the signs and symptoms of concussion.Reviewed need for complete resolution of all symptoms, with rest AND exertion, prior to return to play. Reviewed red flags for urgent medical evaluation: worsening symptoms, nausea/vomiting, intractable headache, musculoskeletal changes, focal neurological deficits. [facility]'s Concussion Care Plan, which clearly outlines the plans stated above, was given to patient.   Level of service: ***     After Visit Summary printed out and provided to patient as appropriate.  The above documentation has been reviewed and is accurate and complete Mena Stai

## 2023-07-21 ENCOUNTER — Encounter: Admitting: Family Medicine

## 2023-07-30 ENCOUNTER — Encounter: Admitting: Family Medicine

## 2023-08-13 ENCOUNTER — Other Ambulatory Visit: Payer: Self-pay

## 2023-08-13 ENCOUNTER — Other Ambulatory Visit: Payer: Self-pay | Admitting: Internal Medicine

## 2023-08-13 DIAGNOSIS — J4550 Severe persistent asthma, uncomplicated: Secondary | ICD-10-CM

## 2023-08-13 NOTE — Progress Notes (Signed)
 Specialty Pharmacy Refill Coordination Note  Julian Oneal is a 58 y.o. male contacted today regarding refills of specialty medication(s) Dupilumab  (Dupixent )   Patient requested Delivery   Delivery date: 08/24/23   Verified address: [address]   [place] [address]   Medication will be filled on 06.23.25.   This fill date is pending response to refill request from provider. Patient is aware and if they have not received fill by intended date they must follow up with pharmacy.

## 2023-08-16 ENCOUNTER — Other Ambulatory Visit: Payer: Self-pay

## 2023-08-16 MED ORDER — DUPIXENT 300 MG/2ML ~~LOC~~ SOAJ
300.0000 mg | SUBCUTANEOUS | 1 refills | Status: DC
  Filled 2023-08-16: qty 4, 28d supply, fill #0
  Filled 2023-09-16: qty 4, 28d supply, fill #1
  Filled 2023-10-18: qty 4, 28d supply, fill #2
  Filled 2023-11-17: qty 4, 28d supply, fill #3
  Filled 2023-12-24: qty 4, 28d supply, fill #4
  Filled 2024-01-17: qty 4, 28d supply, fill #5

## 2023-08-16 NOTE — Telephone Encounter (Signed)
 Refill sent for DUPIXENT  to [facility]: [phone]   Dose: 300mg  subcut every 14 days  Last OV: 07/07/2023 Provider: Dr. Dione Franks  Next OV: due Nov 2025 (not yet scheduled)  Julian Oneal, PharmD, MPH, BCPS Clinical Pharmacist (Rheumatology and Pulmonology)

## 2023-08-23 ENCOUNTER — Telehealth: Payer: Self-pay | Admitting: Pharmacist

## 2023-08-23 ENCOUNTER — Other Ambulatory Visit: Payer: Self-pay

## 2023-08-23 NOTE — Progress Notes (Signed)
 Submitted an URGENT Prior Authorization RENEWAL request to [facility] MEDICAID for DUPIXENT  via CoverMyMeds. Will update once we receive a response.  Key: [id number]

## 2023-08-23 NOTE — Telephone Encounter (Signed)
 Received notification from [facility] MEDICAID regarding a prior authorization for DUPIXENT . Authorization has been APPROVED from 08/23/2023 to 08/22/2024. Approval letter sent to scan center.  Authorization # [id number]  Sherry Pennant, PharmD, MPH, BCPS, CPP Clinical Pharmacist (Rheumatology and Pulmonology)

## 2023-08-23 NOTE — Progress Notes (Signed)
PA for Dupixent approved

## 2023-08-23 NOTE — Telephone Encounter (Signed)
 Submitted an URGENT Prior Authorization RENEWAL request to [facility] MEDICAID for DUPIXENT  via CoverMyMeds. Will update once we receive a response.  Key: [id number]

## 2023-08-24 ENCOUNTER — Other Ambulatory Visit: Payer: Self-pay

## 2023-09-08 ENCOUNTER — Encounter: Payer: Self-pay | Admitting: Neurology

## 2023-09-08 ENCOUNTER — Ambulatory Visit: Admitting: Neurology

## 2023-09-08 VITALS — BP 126/80 | HR 74 | Ht 70.0 in | Wt 159.0 lb

## 2023-09-08 DIAGNOSIS — F0781 Postconcussional syndrome: Secondary | ICD-10-CM

## 2023-09-08 DIAGNOSIS — J320 Chronic maxillary sinusitis: Secondary | ICD-10-CM

## 2023-09-08 DIAGNOSIS — G44021 Chronic cluster headache, intractable: Secondary | ICD-10-CM

## 2023-09-08 DIAGNOSIS — G4451 Hemicrania continua: Secondary | ICD-10-CM | POA: Diagnosis not present

## 2023-09-08 DIAGNOSIS — R519 Headache, unspecified: Secondary | ICD-10-CM

## 2023-09-08 MED ORDER — DICLOFENAC SODIUM 50 MG PO TBEC: 50.0000 mg | DELAYED_RELEASE_TABLET | Freq: Every day | ORAL | 0 refills | Status: DC | PRN

## 2023-09-08 NOTE — Patient Instructions (Signed)
 Indomethacin -Responsive Headache, Adult An indomethacin -responsive headache is a headache that gets better when you take indomethacin . Indomethacin  is a kind of NSAID (nonsteroidal anti-inflammatory drug). Indomethacin  can quickly stop the pain from some kinds of headaches, such as: Paroxysmal hemicrania. This is a series of short, severe headaches, usually on just one side of the head. Hemicrania continua. Pain is nonstop and on one side of the face. Primary exertional headache. Exercise sets off these headaches. Primary cough headache. Pain may come from pressure in the brain when coughing or straining. What are the causes? The exact cause of this condition is not known. Certain things may start (trigger) a headache. They include: Moving the head in certain ways. Stress. Pressure on sensitive areas of the neck. Drinking alcohol. Exercise. Coughing and sneezing. What increases the risk? The following factors may make you more likely to develop this condition: Being 4 years of age or older. Having a serious head injury. Having migraine headaches. Having a family history of this condition. What are the signs or symptoms? Symptoms of this condition depend on the kind of headache you have. Paroxysmal hemicrania: Having about 10 headaches a day. Each may last from a few minutes to 2 hours. Severe, pounding pain. Pain usually on just one side of the head. It often centers around the eye or in the forehead. A watery eye, which may become red or swollen. A droopy or swollen eyelid. Sweating and having a red or pinkish face. A stuffy, runny nose. Hemicrania continua: All-day headache. This may occur daily for at least 3 months. Then there may be no headaches for weeks or months. Pain that gets worse several times during the day. Pain in the face, on one side only. It almost always occurs on the same side. A watery eye. It may also become droopy, red, and swollen. A stuffy, runny  nose. Pain that gets worse with sound or light. Primary exertional headache: Pain during physical activity. Pounding or throbbing pain. Pain that lasts from 5 minutes to 48 hours, or sometimes longer. Primary cough headache: Pain that starts after coughing, sneezing, or straining. Sharp, stabbing pain. Pain on both sides of the head. It is often worse in the back of the head. Pain that is severe for a few minutes and then dull for several hours. How is this diagnosed? This condition may be diagnosed based on: Symptoms and medical history. Your health care provider will ask you questions about your headaches. Physical exam. Tests. These may include: Blood and urine tests. Spinal tap (lumbar puncture). This tests a sample of fluid from your spine. The test checks for infection, bleeding in your brain (brain hemorrhage), or extra pressure inside your skull. Ultrasound, MRI, CT scan, or other imaging tests. If no medical condition is causing your headaches, you will be given indomethacin . If yours is an indomethacin -responsive headache, your symptoms should go away quickly. How is this treated? Treatment may include: Taking indomethacin . Using other medicines: To prevent or treat stomach ulcers. To relieve stomachache or heartburn (antacids). For nausea. Follow these instructions at home: Lifestyle Rest in a dark, quiet room. Put a cool, damp washcloth on your head or face. Get plenty of sleep. Most adults should get at least 7-9 hours of sleep each night. Eat on a regular schedule. Do not skip meals. Do not drink alcohol. Do not use any products that contain nicotine or tobacco. These products include cigarettes, chewing tobacco, and vaping devices, such as e-cigarettes. If you need help quitting, ask your  health care provider. Headache diary Keep a headache diary. This will help you and your health care provider determine what is triggering your headaches. Each time you have a  headache, write down: When it started and stopped. Include the day and time. How it felt. Any triggers, such as noise, stress, or foods. Any medicines you took.  General instructions Take over-the-counter and prescription medicines only as told by your health care provider. Do not take other NSAIDs, such as ibuprofen, with indomethacin . Tell your health care provider about all medicines you are taking, including vitamins, herbs, eye drops, creams, and over-the-counter medicines. Keep all follow-up visits. This is important. Contact a health care provider if: Your pain continues even with treatment. You have nausea. You have a fever. Get help right away if: You have bad stomach pain or vomiting. You vomit blood. You have blood in your stool. You have chest pain. You have any symptoms of a stroke. BE FAST is an easy way to remember the main warning signs of a stroke: B - Balance. Signs are dizziness, sudden trouble walking, or loss of balance. E - Eyes. Signs are trouble seeing or a sudden change in vision. F - Face. Signs are sudden weakness or numbness of the face, or the face or eyelid drooping on one side. A - Arms. Signs are weakness or numbness in an arm. This happens suddenly and usually on one side of the body. S - Speech. Signs are sudden trouble speaking, slurred speech, or trouble understanding what people say. T - Time. Time to call emergency services. Write down what time symptoms started. You have other signs of a stroke, such as: A sudden, severe headache with no known cause. Nausea or vomiting. Seizure. These symptoms may represent a serious problem that is an emergency. Do not wait to see if the symptoms will go away. Get medical help right away. Call your local emergency services (911 in the [place] Do not drive yourself to the hospital. Summary An indomethacin -responsive headache is a headache that gets better when you take indomethacin , a kind of NSAID. The exact  cause of this condition is not known, but there are certain things that may start (trigger) a headache. Keep a headache diary to help your health care provider determine your triggers. Treatment of this condition includes indomethacin , but it may include other medicines to relieve other symptoms. This information is not intended to replace advice given to you by your health care provider. Make sure you discuss any questions you have with your health care provider. Document Revised: 11/13/2022 Document Reviewed: 11/13/2022 Elsevier Patient Education  2024 [employer].

## 2023-09-08 NOTE — Progress Notes (Signed)
 Provider:  Dedra Gores, MD  Primary Care Physician:  Loreli Kins, MD [address] [place] [address]     Referring Provider: Loreli Kins, Md [address] [place],  [place] [address]          Chief Complaint according to patient   Patient presents with:                HISTORY OF PRESENT ILLNESS:  Julian Oneal is a 58 y.o. male patient who is here for revisit 09/08/2023 for continuous headaches. He is having persistent daily headaches. Does occasionally wake up with them. He also fell and fractured both wrist.  In a fall at night inside his home.   He related that he fell during the first 2-3 hours of sleep , medication induced impairment of  awareness. His previous fall had let to a concussion and he fell up the stairs  or onto the stairs.   Chief concern according to patient :  need headache treatment    He is through with his contentious divorce.     HPI 04-21-2023   Julian Oneal is a 58 y.o. male with past medical history of hypertension and hyperlipidemia who presents to the ED complaining of loss of consciousness.  Patient reports that approximately 24 hours ago he was having difficulty sleeping, believes he was wandering around the house and may have been sleepwalking.  He believes he fell down the steps and hit his head, subsequently lost consciousness.  He does not think he lost consciousness prior to the fall and does not recall any chest pain or shortness of breath.  He does state that he has had an abrasion to his right cheek with some headache and right facial pain since the fall.  He describes some dizziness and nausea, is concerned he may have a concussion.  He additionally complains of pain at his right knee and elbow, but has been ambulatory since the fall.  He does not take any blood thinners, does report concerned that he may have accidentally taken an extra dose of his Xanax last night.   Following the fall   04-21-2023 , the facial abrasions have healed but the headaches were excruciating.   A pressure from behind the right eye,  no vision changes but severe phono- and photophobia. Tearing  in the right eye, right face warmer , more flushed.  Nausea initially with concussion, but not now.      CT reviewed, maxillo -facial, Ct Head and CT neck spine.    All  right  sided headaches, not SUNC, hemicrania.       Review of Systems: Out of a complete 14 system review, the patient complains of only the following symptoms, and all other reviewed systems are negative.:   How likely are you to doze in the following situations: 0 = not likely, 1 = slight chance, 2 = moderate chance, 3 = high chance  Sitting and Reading? Watching Television? Sitting inactive in a public place (theater or meeting)? Lying down in the afternoon when circumstances permit? Sitting and talking to someone? Sitting quietly after lunch without alcohol? In a car, while stopped for a few minutes in traffic? As a passenger in a car for an hour without a break?  Total = 9 / 24  FSS:  30 / 63       Social History   Socioeconomic History  Marital status: Legally Separated    Spouse name: Not on file   Number of children: 0   Years of education: College   Highest education level: Not on file  Occupational History   Occupation: fireman    Comment: Neurosurgeon: [employer]  Tobacco Use   Smoking status: Former    Current packs/day: 0.00    Average packs/day: 0.3 packs/day for 5.0 years (1.3 ttl pk-yrs)    Types: Cigarettes    Start date: 03/02/1985    Quit date: 03/02/1990    Years since quitting: 33.5   Smokeless tobacco: Never  Vaping Use   Vaping status: Never Used  Substance and Sexual Activity   Alcohol use: Not Currently    Comment: occasionally   Drug use: No   Sexual activity: Not on file  Other Topics Concern   Not on file  Social History  Narrative   Caucasian male, married, right handed, 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 pt denies any illegal drugs, tobacco use(quit in 1992), consumes alcohol occasionally and consumes coffee.   Social Drivers of Corporate investment banker Strain: Not on file  Food Insecurity: Not on file  Transportation Needs: Not on file  Physical Activity: Not on file  Stress: Not on file  Social Connections: Not on file    Family History  Problem Relation Age of Onset   Prostate cancer Father    Cancer - Prostate Father    Allergies Sister    Heart disease Maternal Grandmother    Cancer - Prostate Maternal Grandfather    Prostate cancer Maternal Grandfather    COPD Maternal Grandfather    Heart disease Paternal Grandmother    Parkinson's disease Maternal Uncle    Alzheimer's disease Paternal Aunt        in their late 77's   Alzheimer's disease Paternal Uncle        in their late 18's    Past Medical History:  Diagnosis Date   Bell's palsy 2008   Dr Jenel   Dyspnea 08/28/2014   Followed in Pulmonary clinic/ [facility]/ [facility]  - 08/28/2014  Walked RA x 3 laps @ 185 ft each stopped due to end of study, fast pace, no doe or desats    - trial off acei 08/28/2014 >> improved 10/15/2014  - PFT's  10/21/2014  FEV1 3.40 (87 % ) ratio 88  p 6 % improvement from saba with DLCO  83 % corrects to 124 % for alv volume      Excessive daytime sleepiness 04/04/2015   Headache(784.0)    Headaches, cluster 06/20/2013   High cholesterol    Hypertension    Insomnia    Obstructive sleep apnea 07/05/2012   Patient diagnosed with OSA and using CPAP at 6 cm water , with residual AHI of 0.8 on 07-04-12 . [facility] is his  DME .     Persistent headaches 07/05/2012    [facility] Dr. Oneita in 2013 -     Pneumonia, viral 06/20/2013   Taste absent     Past Surgical History:  Procedure Laterality Date    CHOLECYSTECTOMY  03/02/2001   FOOT SURGERY  03/02/2006   HAND SURGERY  07/2023   HERNIA REPAIR  [id number]   MOUTH SURGERY       Current Outpatient Medications on File Prior to Visit  Medication  Sig Dispense Refill   albuterol  (PROVENTIL ) (2.5 MG/3ML) 0.083% nebulizer solution Take 3 mLs (2.5 mg total) by nebulization every 6 (six) hours as needed for wheezing or shortness of breath. 75 mL 12   albuterol  (VENTOLIN  HFA) 108 (90 Base) MCG/ACT inhaler Inhale 2 puffs into the lungs every 4 (four) hours as needed. 1 each 5   alprazolam (XANAX) 2 MG tablet Take 2 mg by mouth at bedtime.     azelastine (ASTELIN) 0.1 % nasal spray Place 2 sprays into both nostrils 2 (two) times daily. Use in each nostril as directed     cetirizine  (ZYRTEC ) 10 MG tablet Take 10 mg by mouth daily as needed.      diclofenac  (VOLTAREN ) 50 MG EC tablet Take 1 tablet (50 mg total) by mouth 2 (two) times daily. 60 tablet 0   Dupilumab  (DUPIXENT ) 300 MG/2ML SOAJ Inject 300 mg into the skin every 14 (fourteen) days. 12 mL 1   fenofibrate 160 MG tablet Take 160 mg by mouth daily.     fluticasone  (FLONASE ) 50 MCG/ACT nasal spray Place 2 sprays into both nostrils 2 (two) times daily. 16 g 6   fluticasone -salmeterol (ADVAIR) 500-50 MCG/ACT AEPB Inhale 1 puff into the lungs in the morning and at bedtime. 1 each 5   gabapentin  (NEURONTIN ) 300 MG capsule Take 1 capsule (300 mg total) by mouth 3 (three) times daily. 90 capsule 11   linaclotide (LINZESS) 72 MCG capsule Take 72 mcg by mouth daily before breakfast.     montelukast  (SINGULAIR ) 10 MG tablet Take 1 tablet (10 mg total) by mouth at bedtime. 30 tablet 11   omeprazole (PRILOSEC) 20 MG capsule Take 20 mg by mouth in the morning and at bedtime.     oxyCODONE -acetaminophen  (PERCOCET) 5-325 MG tablet Take 1 tablet by mouth every 4 (four) hours as needed. 12 tablet 0   tadalafil (CIALIS) 10 MG tablet Take 10 mg by mouth daily.  11   Tiotropium Bromide Monohydrate  (SPIRIVA   RESPIMAT) 2.5 MCG/ACT AERS Inhale 2 puffs into the lungs daily. 1 each 11   traZODone  (DESYREL ) 150 MG tablet TAKE 1 TABLET BY MOUTH EVERYDAY AT BEDTIME (Patient taking differently: Take 300 mg by mouth at bedtime. TAKE 1 TABLET BY MOUTH EVERYDAY AT BEDTIME) 30 tablet 0   valsartan  (DIOVAN ) 160 MG tablet TAKE 1 TABLET BY MOUTH EVERY DAY (Patient taking differently: Take 320 mg by mouth daily. TAKE 1 TABLET BY MOUTH EVERY DAY) 90 tablet 3   venlafaxine XR (EFFEXOR-XR) 75 MG 24 hr capsule Take 75 mg by mouth daily with breakfast.     Rimegepant Sulfate (NURTEC) 75 MG TBDP Take 1 tablet (75 mg total) by mouth 2 (two) times daily. (Patient not taking: Reported on 09/08/2023)     No current facility-administered medications on file prior to visit.    Allergies  Allergen Reactions   Amitriptyline     headaches   Silenor [Doxepin Hcl]     headaches   Tribenzor [Olmesartan-Amlodipine-Hctz]     headaches   Bupropion     Other Reaction(s): tremors   Dexmethylphenidate     Other Reaction(s): headaches   Rimegepant Sulfate     Other Reaction(s): ineffective   Topiramate     Severe headaches, memory loss     DIAGNOSTIC DATA (LABS, IMAGING, TESTING) - I reviewed patient records, labs, notes, testing and imaging myself where available.  Lab Results  Component Value Date   WBC 6.0 04/21/2023   HGB 13.4 04/21/2023  HCT 38.3 (L) 04/21/2023   MCV 87.6 04/21/2023   PLT 170 04/21/2023      Component Value Date/Time   NA 136 04/21/2023 0208   K 3.4 (L) 04/21/2023 0208   CL 103 04/21/2023 0208   CO2 25 04/21/2023 0208   GLUCOSE 110 (H) 04/21/2023 0208   BUN 18 04/21/2023 0208   CREATININE 0.96 04/21/2023 0208   CALCIUM 9.2 04/21/2023 0208   PROT 6.2 (L) 04/21/2023 0208   ALBUMIN 4.1 04/21/2023 0208   AST 25 04/21/2023 0208   ALT 21 04/21/2023 0208   ALKPHOS 33 (L) 04/21/2023 0208   BILITOT 1.0 04/21/2023 0208   GFRNONAA >60 04/21/2023 0208   GFRAA  02/06/2008 1045    >60        The  eGFR has been calculated using the MDRD equation. This calculation has not been validated in all clinical   No results found for: CHOL, HDL, LDLCALC, LDLDIRECT, TRIG, CHOLHDL No results found for: YHAJ8R No results found for: VITAMINB12 No results found for: TSH  IMPRESSION: This MRI of the brain with and without contrast shows the following: No acute findings.  Normal enhancement pattern. Couple punctate T2/FLAIR hyperintense foci in the subcortical and deep white matter of the cerebral hemispheres and in the pons consistent with minimal chronic microvascular ischemic change that would be normal for age.  None of the foci enhance or appear to be acute.    PHYSICAL EXAM:  Vitals:   09/08/23 0942  BP: 126/80  Pulse: 74   No data found. Body mass index is 22.81 kg/m.   Wt Readings from Last 3 Encounters:  09/08/23 159 lb (72.1 kg)  07/12/23 145 lb (65.8 kg)  07/07/23 159 lb (72.1 kg)     Ht Readings from Last 3 Encounters:  09/08/23 5' 10 (1.778 m)  07/12/23 5' 10 (1.778 m)  07/07/23 5' 10 (1.778 m)      GGeneral: The patient is awake, alert and appears not in acute distress.  The patient is well groomed. Head: Normocephalic, atraumatic.  Neck is supple.   Neck circumference:16 Cardiovascular:  Regular rate and palpable peripheral pulse:  Respiratory: clear to auscultation.  Mallampati 2, Skin:  Without evidence of edema, or rash Trunk: BMI is 22.67  and patient  has normal posture.     Neurologic exam : The patient is awake and alert, oriented to place and time.   Memory subjective  described as intact.  There is a normal attention span & concentration ability.  Speech is fluent without  dysarthria, dysphonia or aphasia.  Mood and affect are appropriate.   Cranial nerves: Pupils are equal and briskly reactive to light.  The left eye shows a ptosis.  Funduscopic exam without  evidence of pallor or edema. Extraocular movements  in vertical  and horizontal planes intact and without nystagmus. Visual fields by finger perimetry are intact. Hearing to finger rub intact.  Facial sensation intact to fine touch. Facial motor strength is symmetric and tongue and uvula move midline.   Motor exam:   Normal tone and normal muscle bulk and symmetric normal strength in all extremities. Grip Strength equal  Proximal strength of shoulder muscles and hip flexors was intact .   Sensory:  Fine touch and vibration were tested.  Proprioception was tested in the upper extremities only and was  normal.    ASSESSMENT AND PLAN :   58 y.o. year old male  here with:    1) Hemicrania  continua.  Right upper quadrat of the face and eye socket.   Left sided ptosis due to Bell's palsy.    2) recent falls.  Reduction in gabapentin  may help.    3) Nurtec  cuts the edge off-   his insurance would not cover  this medication.   Will give samples of other -gepant medications. He will let us  know if one of them helps more than the other kind.   Gabapentin   has may be reduced the pain level-  300  mg tid.  Reduce trazodone  to reduce sleep drunkenness.  Avoid xanax-  and alcohol.  Diclofenac  for acute pain, prn , only after food.     I would like to thank Loreli Kins, MD and Loreli Kins, Md [address] [place],  [place] [address] for allowing me to meet with and to take care of this pleasant patient.   The patient's condition requires frequent monitoring and adjustments in the treatment plan, reflecting the ongoing complexity of care.  This provider is the continuing focal point for all needed services for this condition.  After spending a total time of 35  minutes face to face and time for  history taking, physical and neurologic examination, review of laboratory studies,  personal review of imaging studies, reports and results of other testing and review of referral information / records as far as provided in visit,    Electronically signed by: Dedra Gores, MD 09/08/2023 10:04 AM  [facility] Board certified by The [employer] of Sleep Medicine and Diplomate of the [employer] of Sleep Medicine. Board certified In Neurology through the ABPN, Fellow of the [employer] of Neu[employer].

## 2023-09-09 ENCOUNTER — Ambulatory Visit: Payer: Self-pay | Admitting: Neurology

## 2023-09-13 LAB — MULTIPLE MYELOMA PANEL, SERUM
Albumin SerPl Elph-Mcnc: 3.8 g/dL (ref 2.9–4.4)
Albumin/Glob SerPl: 1.5 (ref 0.7–1.7)
Alpha 1: 0.2 g/dL (ref 0.0–0.4)
Alpha2 Glob SerPl Elph-Mcnc: 0.6 g/dL (ref 0.4–1.0)
B-Globulin SerPl Elph-Mcnc: 0.9 g/dL (ref 0.7–1.3)
Gamma Glob SerPl Elph-Mcnc: 0.8 g/dL (ref 0.4–1.8)
Globulin, Total: 2.6 g/dL (ref 2.2–3.9)
IgA/Immunoglobulin A, Serum: 82 mg/dL — ABNORMAL LOW (ref 90–386)
IgG (Immunoglobin G), Serum: 753 mg/dL (ref 603–1613)
IgM (Immunoglobulin M), Srm: 67 mg/dL (ref 20–172)
Total Protein: 6.4 g/dL (ref 6.0–8.5)

## 2023-09-13 LAB — SEDIMENTATION RATE: Sed Rate: 2 mm/h (ref 0–30)

## 2023-09-13 LAB — ANA W/REFLEX IF POSITIVE: Anti Nuclear Antibody (ANA): NEGATIVE

## 2023-09-14 ENCOUNTER — Other Ambulatory Visit (HOSPITAL_COMMUNITY): Payer: Self-pay

## 2023-09-16 ENCOUNTER — Other Ambulatory Visit: Payer: Self-pay

## 2023-09-16 ENCOUNTER — Other Ambulatory Visit: Payer: Self-pay | Admitting: Pharmacy Technician

## 2023-09-16 NOTE — Progress Notes (Signed)
 Specialty Pharmacy Refill Coordination Note  Julian Oneal is a 58 y.o. male contacted today regarding refills of specialty medication(s) Dupilumab  (Dupixent )   Patient requested Delivery   Delivery date: 09/28/23   Verified address: [address]   [place] [address]   Medication will be filled on 09/27/23.   Co-pay $4

## 2023-09-21 ENCOUNTER — Ambulatory Visit: Admitting: Pulmonary Disease

## 2023-09-21 ENCOUNTER — Encounter: Payer: Self-pay | Admitting: Pulmonary Disease

## 2023-09-21 VITALS — BP 151/91 | HR 75 | Ht 70.0 in | Wt 161.0 lb

## 2023-09-21 DIAGNOSIS — I059 Rheumatic mitral valve disease, unspecified: Secondary | ICD-10-CM

## 2023-09-21 DIAGNOSIS — J4551 Severe persistent asthma with (acute) exacerbation: Secondary | ICD-10-CM | POA: Diagnosis not present

## 2023-09-21 DIAGNOSIS — R0609 Other forms of dyspnea: Secondary | ICD-10-CM

## 2023-09-21 MED ORDER — PREDNISONE 20 MG PO TABS: ORAL_TABLET | ORAL | 0 refills | Status: AC

## 2023-09-21 NOTE — Patient Instructions (Signed)
 Nice to meet you  Let see if prednisone  helps with the tightness and shortness of breath is worsened  I am going to send a referral to the cardiologist to reevaluate given there was an abnormality on your echocardiogram in the past  If things are not getting better in the next few days with the prednisone , send us  a message and we should consider a CT scan to make sure no blood clots occurred after your fall, with inflammation and fracture sometimes blood clots can occur.  Return to clinic in 4 to 6 weeks with Dr. Meade

## 2023-09-21 NOTE — Progress Notes (Signed)
 @Patient  ID: Julian Oneal, male    DOB: 1965/03/13, 58 y.o.   MRN: [id number]  Chief Complaint  Patient presents with   Acute Visit    PT statesd X 1 month  SOB, INH more often     Referring provider: Loreli Kins, MD  HPI:   58 y.o. man with presumed history of severe persistent asthma on biologic therapy via Dupixent  and triple inhaled therapy presenting for acute visit with worsening shortness of breath.  Multiple prior pulmonary notes reviewed.  Most recent PCP note reviewed.  ED note 07/2023 after fall reviewed.  Patient notes worsening dyspnea over the last month or so.  May be starting after fall.  Has had multiple falls.  Being worked up by neurologist.  Per prior notes in his report was using albuterol  4-5 times a day at baseline.  Now up to 8 times a day.  Minimal relief.  Does not think inhalers are helping.  Not sure Dupixent  is helping either.  Baseline dyspnea is severe but certainly worse over the last month or 2.  His lungs are clear.  We discussed possible PE, would need to evaluate with CTA PE protocol as it is possible after fall and fracture with increased inflammatory state VTE occurred which could have traveled to lungs.  He declines this today.  We discussed prednisone  therapy to see if it helps, if asthma exacerbation it should help.  If discouraging increase in the inhaler use has not helped.  Also discussed in the past with abnormal echocardiogram particular with MVR moderate 2019.  Really lost to follow-up.  He says cardiology is retired.  He is accepting of a referral to cardiology today.  No sign of volume overload or florid heart failure on exam.  Questionaires / Pulmonary Flowsheets:   ACT:  Asthma Control Test ACT Total Score  07/07/2023  8:38 AM 9  11/12/2022  8:38 AM 6  05/26/2022  2:20 PM 9    MMRC:     No data to display          Epworth:      No data to display          Tests:   FENO:  No results found for: NITRICOXIDE  PFT:     Latest Ref Rng & Units 08/26/2022    2:00 PM 10/15/2014    9:55 AM  PFT Results  FVC-Pre L  3.83   FVC-Predicted Pre % 70  76   FVC-Post L 3.37  3.88   FVC-Predicted Post % 68  77   Pre FEV1/FVC % % 85  82   Post FEV1/FCV % % 86  88   FEV1-Pre L 2.93  3.12   FEV1-Predicted Pre % 78  80   FEV1-Post L 2.91  3.40   DLCO uncorrected ml/min/mmHg 29.25  26.46   DLCO UNC% % 103  83   DLCO corrected ml/min/mmHg 29.25    DLCO COR %Predicted % 103    DLVA Predicted % 117  124   TLC L 5.11  5.01   TLC % Predicted % 73  73   RV % Predicted % 101  72   Percent reviewed, 2024 PFTs interpreted as no fixed obstruction, no significant bronchodilator response, lung volumes consistent with air trapping  WALK:     08/28/2014   10:25 AM  SIX MIN WALK  Supplimental Oxygen during Test? (L/min) No  Tech Comments: Pt's lowest ambulatory sat was 94 on RA. Pt walked at  a fast pace with no breaks.    Imaging: Personally reviewed and as per EMR in discussion of this note No results found.  Lab Results: Personally reviewed CBC    Component Value Date/Time   WBC 6.0 04/21/2023 0208   RBC 4.37 04/21/2023 0208   HGB 13.4 04/21/2023 0208   HCT 38.3 (L) 04/21/2023 0208   PLT 170 04/21/2023 0208   MCV 87.6 04/21/2023 0208   MCH 30.7 04/21/2023 0208   MCHC 35.0 04/21/2023 0208   RDW 12.1 04/21/2023 0208   LYMPHSABS 1.8 04/21/2023 0208   MONOABS 0.7 04/21/2023 0208   EOSABS 0.0 04/21/2023 0208   BASOSABS 0.0 04/21/2023 0208    BMET    Component Value Date/Time   NA 136 04/21/2023 0208   K 3.4 (L) 04/21/2023 0208   CL 103 04/21/2023 0208   CO2 25 04/21/2023 0208   GLUCOSE 110 (H) 04/21/2023 0208   BUN 18 04/21/2023 0208   CREATININE 0.96 04/21/2023 0208   CALCIUM 9.2 04/21/2023 0208   GFRNONAA >60 04/21/2023 0208   GFRAA  02/06/2008 1045    >60        The eGFR has been calculated using the MDRD equation. This calculation has not been validated in all clinical    BNP No results  found for: BNP  ProBNP No results found for: PROBNP  Specialty Problems       Pulmonary Problems   Obstructive sleep apnea   Patient diagnosed with OSA and using CPAP at 6 cm water , with residual AHI of 0.8 on 07-04-12 . [facility] is his  DME .       Sleep apnea with use of continuous positive airway pressure (CPAP)   Patient continues on CPAP for the treatment of obstructive sleep apnea, percent compliance. Epworth sleepiness score and Doris on 08-02-12 at 15 points. Patient has continued chief complaint of insomnia and severe left-sided headaches. Sleep study 08-4011 CPAP titration after AHI of 28 and supine AHI of 45. Titrated to 6 cm water.      Pneumonia, viral   Dyspnea   Followed in Pulmonary clinic/ [facility]/ [place]  - 08/28/2014  Walked RA x 3 laps @ 185 ft each stopped due to end of study, fast pace, no doe or desats    - trial off acei 08/28/2014 >> improved 10/15/2014  - PFT's  10/21/2014  FEV1 3.40 (87 % ) ratio 88  p 6 % improvement from saba with DLCO  83 % corrects to 124 % for alv volume        OSA on CPAP   Deviated nasal septum   Recurrent sinusitis   Hypertrophy of both inferior nasal turbinates   Nasal congestion   Chronic maxillary sinusitis   Rhinitis medicamentosa    Allergies  Allergen Reactions   Amitriptyline     headaches   Silenor [Doxepin Hcl]     headaches   Tribenzor [Olmesartan-Amlodipine-Hctz]     headaches   Bupropion     Other Reaction(s): tremors   Dexmethylphenidate     Other Reaction(s): headaches   Rimegepant Sulfate     Other Reaction(s): ineffective   Topiramate     Severe headaches, memory loss    Immunization History  Administered Date(s) Administered   Influenza Inj Mdck Quad Pf 12/30/2016, 11/30/2017   Influenza Split 11/30/2013   Influenza, Seasonal, Injecte, Preservative Fre 11/12/2022   Influenza-Unspecified 12/01/2018, 10/31/2021   Tdap 03/24/2014    Past Medical History:  Diagnosis Date  Bell's  palsy 2008   Dr Jenel   Dyspnea 08/28/2014   Followed in Pulmonary clinic/ [facility]/ [place]  - 08/28/2014  Walked RA x 3 laps @ 185 ft each stopped due to end of study, fast pace, no doe or desats    - trial off acei 08/28/2014 >> improved 10/15/2014  - PFT's  10/21/2014  FEV1 3.40 (87 % ) ratio 88  p 6 % improvement from saba with DLCO  83 % corrects to 124 % for alv volume      Excessive daytime sleepiness 04/04/2015   Headache(784.0)    Headaches, cluster 06/20/2013   High cholesterol    Hypertension    Insomnia    Obstructive sleep apnea 07/05/2012   Patient diagnosed with OSA and using CPAP at 6 cm water , with residual AHI of 0.8 on 07-04-12 . [facility] is his  DME .     Persistent headaches 07/05/2012    Referred [facility] Dr. Oneita in 2013 -     Pneumonia, viral 06/20/2013   Taste absent     Tobacco History: Social History   Tobacco Use  Smoking Status Former   Current packs/day: 0.00   Average packs/day: 0.3 packs/day for 5.0 years (1.3 ttl pk-yrs)   Types: Cigarettes   Start date: 03/02/1985   Quit date: 03/02/1990   Years since quitting: 33.5  Smokeless Tobacco Never   Counseling given: Not Answered   Continue to not smoke  Outpatient Encounter Medications as of 09/21/2023  Medication Sig   predniSONE  (DELTASONE ) 20 MG tablet Take 2 tablets (40 mg total) by mouth daily with breakfast for 5 days, THEN 1 tablet (20 mg total) daily with breakfast for 5 days.   albuterol  (PROVENTIL ) (2.5 MG/3ML) 0.083% nebulizer solution Take 3 mLs (2.5 mg total) by nebulization every 6 (six) hours as needed for wheezing or shortness of breath.   albuterol  (VENTOLIN  HFA) 108 (90 Base) MCG/ACT inhaler Inhale 2 puffs into the lungs every 4 (four) hours as needed.   alprazolam (XANAX) 2 MG tablet Take 2 mg by mouth at bedtime.   azelastine (ASTELIN) 0.1 % nasal spray Place 2 sprays into both nostrils 2 (two) times daily. Use in each nostril as directed   cetirizine   (ZYRTEC ) 10 MG tablet Take 10 mg by mouth daily as needed.    diclofenac  (VOLTAREN ) 50 MG EC tablet Take 1 tablet (50 mg total) by mouth daily as needed.   Dupilumab  (DUPIXENT ) 300 MG/2ML SOAJ Inject 300 mg into the skin every 14 (fourteen) days.   fenofibrate 160 MG tablet Take 160 mg by mouth daily.   fluticasone  (FLONASE ) 50 MCG/ACT nasal spray Place 2 sprays into both nostrils 2 (two) times daily.   fluticasone -salmeterol (ADVAIR) 500-50 MCG/ACT AEPB Inhale 1 puff into the lungs in the morning and at bedtime.   gabapentin  (NEURONTIN ) 300 MG capsule Take 1 capsule (300 mg total) by mouth 3 (three) times daily.   linaclotide (LINZESS) 72 MCG capsule Take 72 mcg by mouth daily before breakfast.   montelukast  (SINGULAIR ) 10 MG tablet Take 1 tablet (10 mg total) by mouth at bedtime.   omeprazole (PRILOSEC) 20 MG capsule Take 20 mg by mouth in the morning and at bedtime.   tadalafil (CIALIS) 10 MG tablet Take 10 mg by mouth daily.   Tiotropium Bromide Monohydrate  (SPIRIVA  RESPIMAT) 2.5 MCG/ACT AERS Inhale 2 puffs into the lungs daily.   traZODone  (DESYREL ) 150 MG tablet TAKE 1 TABLET BY MOUTH  EVERYDAY AT BEDTIME (Patient taking differently: Take 300 mg by mouth at bedtime. TAKE 1 TABLET BY MOUTH EVERYDAY AT BEDTIME)   valsartan  (DIOVAN ) 160 MG tablet TAKE 1 TABLET BY MOUTH EVERY DAY (Patient taking differently: Take 320 mg by mouth daily. TAKE 1 TABLET BY MOUTH EVERY DAY)   venlafaxine XR (EFFEXOR-XR) 75 MG 24 hr capsule Take 75 mg by mouth daily with breakfast.   No facility-administered encounter medications on file as of 09/21/2023.     Review of Systems  Review of Systems  No chest pain exertion.  No orthopnea or PND.  Comprehensive review of systems otherwise negative. Physical Exam  BP (!) 151/91 (BP Location: Left Arm, Patient Position: Sitting, Cuff Size: Normal)   Pulse 75   Ht 5' 10 (1.778 m)   Wt 161 lb (73 kg)   SpO2 97%   BMI 23.10 kg/m   Wt Readings from Last 5  Encounters:  09/21/23 161 lb (73 kg)  09/08/23 159 lb (72.1 kg)  07/12/23 145 lb (65.8 kg)  07/07/23 159 lb (72.1 kg)  06/30/23 156 lb (70.8 kg)    BMI Readings from Last 5 Encounters:  09/21/23 23.10 kg/m  09/08/23 22.81 kg/m  07/12/23 20.81 kg/m  07/07/23 22.81 kg/m  06/30/23 22.38 kg/m     Physical Exam General: Thin, sitting up in chair Eyes: EOMI, no icterus Neck: Supple, no JVP Pulmonary: Clear, no crackles no wheeze, normal work of breathing Cardiovascular: Warm, no edema Abdomen: Distended MSK: No synovitis, no joint effusion Neuro: Normal gait, no weakness Psych: Normal mood, full affect   Assessment & Plan:   Severe persistent asthma with acute exacerbation: Chest tightness, increased dyspnea.  Increase use albuterol  to up to 8 times a day from baseline 4-5.  Lung exam is clear.  No wheeze or crackle or concerning finding.  Encouraged to continue all inhaled medications and Dupixent .  On maximal therapy.  Prednisone  taper.  Dyspnea on exertion: Chronic issue really not significantly relieved with maximal inhaler therapy and biologic for asthma given baseline use of albuterol  4-5 times a day.  In the past he had abnormal echocardiogram with significant MVR in 2019.  He has been lost to follow-up from cardiology perspective.  I sent a referral to cardiology today for further evaluation to see if there is structural abnormality contribute to his dyspnea which I suspect is.  Fortunately, there is no evidence of florid heart failure, lungs are clear, no lower extremity edema etc.  Given worsening of dyspnea on exertion after fall and fractures, we discussed possible pulmonary embolism.  I offered CTA PE protocol to evaluate this, he declined this today.  I encouraged him to prednisone  is not helping him, days to contact our office and we should proceed with CTA PE protocol, may be low yield but worth ruling out given worsening dyspnea not responding to maximal asthma  therapy.   Return in about 4 weeks (around 10/19/2023) for f/u Dr. Meade.   Donnice JONELLE Beals, MD 09/21/2023

## 2023-09-27 ENCOUNTER — Other Ambulatory Visit: Payer: Self-pay

## 2023-10-14 ENCOUNTER — Other Ambulatory Visit: Payer: Self-pay

## 2023-10-18 ENCOUNTER — Other Ambulatory Visit: Payer: Self-pay

## 2023-10-18 ENCOUNTER — Other Ambulatory Visit: Payer: Self-pay | Admitting: Pharmacy Technician

## 2023-10-18 NOTE — Progress Notes (Signed)
 Specialty Pharmacy Refill Coordination Note  Julian Oneal is a 58 y.o. male contacted today regarding refills of specialty medication(s) Dupilumab  (Dupixent )   Patient requested Delivery   Delivery date: 10/26/23   Verified address: [address]   [place] [address]   Medication will be filled on 10/25/23. Injection dates: 8/1/ & 9/1.

## 2023-10-25 ENCOUNTER — Other Ambulatory Visit: Payer: Self-pay

## 2023-11-08 ENCOUNTER — Other Ambulatory Visit: Payer: Self-pay | Admitting: Internal Medicine

## 2023-11-10 ENCOUNTER — Ambulatory Visit: Admitting: Internal Medicine

## 2023-11-10 ENCOUNTER — Encounter: Payer: Self-pay | Admitting: Internal Medicine

## 2023-11-10 VITALS — BP 128/84 | HR 90 | Temp 98.4°F | Ht 70.0 in | Wt 167.4 lb

## 2023-11-10 DIAGNOSIS — J4550 Severe persistent asthma, uncomplicated: Secondary | ICD-10-CM

## 2023-11-10 DIAGNOSIS — R0602 Shortness of breath: Secondary | ICD-10-CM

## 2023-11-10 DIAGNOSIS — J309 Allergic rhinitis, unspecified: Secondary | ICD-10-CM | POA: Diagnosis not present

## 2023-11-10 DIAGNOSIS — K219 Gastro-esophageal reflux disease without esophagitis: Secondary | ICD-10-CM | POA: Diagnosis not present

## 2023-11-10 DIAGNOSIS — Z87891 Personal history of nicotine dependence: Secondary | ICD-10-CM | POA: Diagnosis not present

## 2023-11-10 DIAGNOSIS — J4551 Severe persistent asthma with (acute) exacerbation: Secondary | ICD-10-CM

## 2023-11-10 MED ORDER — AIRSUPRA 90-80 MCG/ACT IN AERO: 2.0000 | INHALATION_SPRAY | RESPIRATORY_TRACT | 11 refills | Status: DC | PRN

## 2023-11-10 MED ORDER — BUDESONIDE 0.5 MG/2ML IN SUSP: 0.5000 mg | Freq: Every day | RESPIRATORY_TRACT | 12 refills | Status: AC

## 2023-11-10 NOTE — Progress Notes (Signed)
 Julian Oneal    [id number]    06/29/1965  Primary Care Physician:Shaw, Joen, MD Date of Appointment: 11/10/2023 Established Patient Visit  Chief complaint:   Chief Complaint  Patient presents with   Asthma    SOB and dyspnea persistent. Dupixent  is helping asthma.  Got flu and pneumonia vaccine yesterday    HPI: Julian Oneal is a 58 y.o. 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 Asthma Symptoms started around 06/01/2001. Started Dupixent  November 2024  History of sleep apnea which was resolved on home sleep study 2020.   Interval Updates:  Discussed the use of AI scribe software for clinical note transcription with the patient, who gave verbal consent to proceed.  History of Present Illness Julian Oneal is a 58 year old male with asthma who presents with worsening shortness of breath.  He experiences worsening shortness of breath, particularly with inclines or bending over, and notes that his breathing is worse compared to a year ago. He has been on Dupixent  since November of last year, which he feels is helping in prednisone  reduction, but he still experiences daily shortness of breath especially when leaning forward or going uphill. Given prednisone  in July 2025 and feels like it helped.   He uses albuterol  as needed, but finds it provides only minimal relief for his shortness of breath. He also experiences occasional chest pain and palpitations. His allergies are controlled, and he does not have significant issues with postnasal drainage or chronic coughing, although he occasionally coughs with exertion.  He is currently on Spiriva  and Advair 500 once daily, in addition to albuterol  as needed. He has a nebulizer machine at home, which he inherited from his father, but he may need a new nebulizer set.  His sleep is generally okay. 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  Has cardiology follow up later this month.    Current Regimen: 500 advair 1 puff twice daily and spiriva  once daily, dupixent  started Nov 2024. Albuterol  as needed Asthma Triggers: URI, seasonal environmental allergies, exertion.  Exacerbations in the last year: once in July 2025. In 2024 needed 6-7 times.  History of hospitalization or intubation: never intubated Allergy  Testing: never had GERD: yes on PPI, controlled Allergic Rhinitis: zyrtec , singulair , astelin (not currently taking.) ACT:  Asthma Control Test ACT Total Score  11/10/2023  9:50 AM 7  07/07/2023  8:38 AM 9  11/12/2022  8:38 AM 6   FeNO: 15 ppb  I have reviewed the patient's family social and past medical history and updated as appropriate.   Past Medical History:  Diagnosis Date   Bell's palsy 2008   Dr Jenel   Dyspnea 08/28/2014   Followed in Pulmonary clinic/ [facility]/ [facility]  - 08/28/2014  Walked RA x 3 laps @ 185 ft each stopped due to end of study, fast pace, no doe or desats    - trial off acei 08/28/2014 >> improved 10/15/2014  - PFT's  10/21/2014  FEV1 3.40 (87 % ) ratio 88  p 6 % improvement from saba with DLCO  83 % corrects to 124 % for alv volume      Excessive daytime sleepiness 04/04/2015   Headache(784.0)    Headaches, cluster 06/20/2013   High cholesterol    Hypertension  Insomnia    Obstructive sleep apnea 07/05/2012   Patient diagnosed with OSA and using CPAP at 6 cm water , with residual AHI of 0.8 on 07-04-12 . [facility] is his  DME .     Persistent headaches 07/05/2012    Referred to [facility] Dr. Oneita in 2013 -     Pneumonia, viral 06/20/2013   Taste absent     Past Surgical History:  Procedure Laterality Date   CHOLECYSTECTOMY  03/02/2001   FOOT SURGERY  03/02/2006   HAND SURGERY  07/2023   HERNIA REPAIR  [id number]   MOUTH SURGERY      Family History  Problem Relation Age of Onset   Prostate  cancer Father    Cancer - Prostate Father    Allergies Sister    Heart disease Maternal Grandmother    Cancer - Prostate Maternal Grandfather    Prostate cancer Maternal Grandfather    COPD Maternal Grandfather    Heart disease Paternal Grandmother    Parkinson's disease Maternal Uncle    Alzheimer's disease Paternal Aunt        in their late 30's   Alzheimer's disease Paternal Uncle        in their late 71's    Social History   Occupational History   Occupation: fireman    Comment: Neurosurgeon: [employer]  Tobacco Use   Smoking status: Former    Current packs/day: 0.00    Average packs/day: 0.3 packs/day for 5.0 years (1.3 ttl pk-yrs)    Types: Cigarettes    Start date: 03/02/1985    Quit date: 03/02/1990    Years since quitting: 33.7   Smokeless tobacco: Never  Vaping Use   Vaping status: Never Used  Substance and Sexual Activity   Alcohol use: Not Currently    Comment: occasionally   Drug use: No   Sexual activity: Not on file     Physical Exam: Blood pressure 128/84, pulse 90, temperature 98.4 F (36.9 C), temperature source Oral, height 5' 10 (1.778 m), weight 167 lb 6.4 oz (75.9 kg), SpO2 98%.   Gen: no distress Resp: ctab no wheezes or crackles CV: RRR no mrg Ext: no edema  Data Reviewed: Imaging: I have personally reviewed the CT Chest dec 2024. Negative for PE, no pulmonary disease  PFTs: PFTs reviewed - normal pulmonary function 2024    Latest Ref Rng & Units 08/26/2022    2:00 PM 10/15/2014    9:55 AM  PFT Results  FVC-Pre L  3.83   FVC-Predicted Pre % 70  76   FVC-Post L 3.37  3.88   FVC-Predicted Post % 68  77   Pre FEV1/FVC % % 85  82   Post FEV1/FCV % % 86  88   FEV1-Pre L 2.93  3.12   FEV1-Predicted Pre % 78  80   FEV1-Post L 2.91  3.40   DLCO uncorrected ml/min/mmHg 29.25  26.46   DLCO UNC% % 103  83   DLCO corrected ml/min/mmHg 29.25    DLCO COR %Predicted % 103    DLVA Predicted  % 117  124   TLC L 5.11  5.01   TLC % Predicted % 73  73   RV % Predicted % 101  72     Labs: Lab Results  Component Value Date   NA 136 04/21/2023   K 3.4 (L) 04/21/2023   CO2 25 04/21/2023  GLUCOSE 110 (H) 04/21/2023   BUN 18 04/21/2023   CREATININE 0.96 04/21/2023   CALCIUM 9.2 04/21/2023   GFRNONAA >60 04/21/2023   Lab Results  Component Value Date   WBC 6.0 04/21/2023   HGB 13.4 04/21/2023   HCT 38.3 (L) 04/21/2023   MCV 87.6 04/21/2023   PLT 170 04/21/2023    Immunization status: Immunization History  Administered Date(s) Administered   Influenza Inj Mdck Quad Pf 12/30/2016, 11/30/2017   Influenza Split 11/30/2013   Influenza, Seasonal, Injecte, Preservative Fre 11/12/2022   Influenza-Unspecified 12/01/2018, 10/31/2021   Tdap 03/24/2014    External Records Personally Reviewed: family medicine, ED, ENT, SLP, neurology  Assessment:  Severe persistent asthma not well controlled Allergic rhinitis controlled GERD, on PPI, controlled  Plan/Recommendations:  Continue advair at high dose 1 puff twice daily Continue spiriva  inhaler 2 puffs once daily.  Continue albuterol  either inhaler or nebulized as needed up to 4 times/day.  Will add budesonide  nebulizer treatments once daily since the steroids were helpful in July. Can increase to twice a day when you are feeling sick.   Continue acid reflux medication Take astelin, cetirizine , montelukast  for allergies and asthma  Continue dupixent  injections. This is helping due to dramatic reduction in prednisone  use.  Unfortunately air supra not covered by medicaid. When you are feeling up for it I think going back to speech therapy would be helpful in getting control of your breathing.  Follow up with cardiology later this month to rule out heart disease.   He is having difficulty holding employment due to his severe asthma as well as issues related to fall resulting in concussion, chronic pain limiting ability to  stand, talk, push, pull and carry things.   Return to Care: Return in about 3 months (around 02/09/2024).   Verdon Gore, MD Pulmonary and Critical Care Medicine [facility] [phone]

## 2023-11-10 NOTE — Patient Instructions (Addendum)
 It was a pleasure to see you today!  Please schedule follow up with myself in 3 months.  If my schedule is not open yet, we will contact you with a reminder closer to that time. Please call [phone] if you haven't heard from us  a month before, and always call us  sooner if issues or concerns arise. You can also send us  a message through MyChart, but but aware that this is not to be used for urgent issues and it may take up to 5-7 days to receive a reply. Please be aware that you will likely be able to view your results before I have a chance to respond to them. Please give us  5 business days to respond to any non-urgent results.    Before your next visit I would like you to have: Full set of PFTs  Continue advair at high dose 1 puff twice daily Continue spiriva  inhaler 2 puffs once daily.  Continue albuterol  either inhaler or nebulized as needed up to 4 times/day.   Unfortunately air supra not covered by medicaid. Will add budesonide  nebulizer treatments once daily since the steroids were helpful in July. Can increase to twice a day when you are feeling sick.   Continue acid reflux medication Take astelin, cetirizine , montelukast  for allergies and asthma  Continue dupixent  injections. This is helping due to dramatic reduction in prednisone  use.  When you are feeling up for it I think going back to speech therapy would be helpful in getting control of your breathing.  Follow up with cardiology later this month to rule out heart disease.

## 2023-11-14 ENCOUNTER — Other Ambulatory Visit: Payer: Self-pay | Admitting: Internal Medicine

## 2023-11-14 DIAGNOSIS — J4550 Severe persistent asthma, uncomplicated: Secondary | ICD-10-CM

## 2023-11-15 ENCOUNTER — Other Ambulatory Visit: Payer: Self-pay

## 2023-11-15 ENCOUNTER — Other Ambulatory Visit (HOSPITAL_COMMUNITY): Payer: Self-pay

## 2023-11-16 NOTE — Progress Notes (Signed)
 Cardiology Office Note:    Date:  11/29/2023   ID:  Julian Oneal, DOB 1965/05/25, MRN [id number]  PCP:  Julian Kins, MD   [facility]  Cardiologist:  New to me  Electrophysiologist:  None       Referring MD: Julian Donnice SAUNDERS, MD   Chief Complaint:   Dyspnea     Patient Profile:    Julian Oneal is a 58 y.o. male with:  Hypertension Hyperlipidemia Family history of CAD Coronary CTA 7/17: Calcium score 0; no CAD CT cardiac scoring 10/2018: Calcium score 0 Echocardiogram 10/2018: EF 60-65 OSA on CPAP Chronic headaches  Prior CV studies: Echocardiogram 10/25/2018 EF 60-65, normal RVSF  CT Cardiac Scoring 10/03/2018 IMPRESSION: Coronary calcium score of 0.  Coronary CTA 09/19/15 FINDINGS: Non-cardiac: See separate report from [facility]. No significant findings on limited lung and soft tissue windows. Calcium Score:  0 Coronary Arteries: Right dominant with no anomalies LM: Normal LAD:  Normal D1: Normal Circumflex: Normal OM1: Normal OM2: Normal RCA:  Dominant and normal PDA: Normal PLA:  Normal IMPRESSION: 1) Calcium Score 0 2) Normal right dominant coronary arteries  Carotid US  01/13/2013 No hemodynamically significant ICA  stenosis bilaterally  History of Present Illness: 58 y.o. No documented cardiac issues  Has been seen by PA He is overall doing well without chest pain, syncope, orthopnea, leg edema.  He has chronic dyspnea on exertion with certain activities.  This has been chronic and unchanged since he had pneumonia years ago. Sees pulmonary for asthma and uses Dupixent . Also using Spiriva  and Advair. Remote light smoker. CTA 01/2023 no PE or parenchymal lung dx.  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 His stress level is better.  Has seen PT/OT for lower back pain   Father passed 2023 mom still living next door He is separated from his wife of 31 years no children [place]  [address] [place] and has a small farm with cows   Sees neuro for headaches MRI 07/04/23 no acute pathology  04/21/23 LOC ? Sleep walking fell and hit head lead to daily headaches Also fractured left wrist. Thought he was opening door to bathroom but fell down stairs.   Dyspnea is exertional No associated chest pain.   No chest pain ECG normal today     Past Medical History:  Diagnosis Date   Bell's palsy 2008   Dr Jenel   Dyspnea 08/28/2014   Followed in Pulmonary clinic/ [facility]/ [place]  - 08/28/2014  Walked RA x 3 laps @ 185 ft each stopped due to end of study, fast pace, no doe or desats    - trial off acei 08/28/2014 >> improved 10/15/2014  - PFT's  10/21/2014  FEV1 3.40 (87 % ) ratio 88  p 6 % improvement from saba with DLCO  83 % corrects to 124 % for alv volume      Excessive daytime sleepiness 04/04/2015   Headache(784.0)    Headaches, cluster 06/20/2013   High cholesterol    Hypertension    Insomnia    Obstructive sleep apnea 07/05/2012   Patient diagnosed with OSA and using CPAP at 6 cm water , with residual AHI of 0.8 on 07-04-12 . [employer] is his  DME .     Persistent headaches 07/05/2012    [facility] Dr. Oneita in 2013 -     Pneumonia, viral 06/20/2013   Taste  absent     Current Medications: Current Meds  Medication Sig   albuterol  (VENTOLIN  HFA) 108 (90 Base) MCG/ACT inhaler Inhale 2 puffs into the lungs every 4 (four) hours as needed.   alprazolam (XANAX) 2 MG tablet Take 2 mg by mouth at bedtime. (Patient taking differently: Take 1 mg by mouth at bedtime.)   azelastine (ASTELIN) 0.1 % nasal spray Place 2 sprays into both nostrils 2 (two) times daily. Use in each nostril as directed   budesonide  (PULMICORT ) 0.5 MG/2ML nebulizer solution Take 2 mLs (0.5 mg total) by nebulization daily.   cetirizine  (ZYRTEC ) 10 MG tablet Take 10 mg by mouth daily as needed.    Dupilumab  (DUPIXENT ) 300 MG/2ML SOAJ Inject 300 mg into the skin every 14  (fourteen) days.   fenofibrate 160 MG tablet Take 160 mg by mouth daily. (Patient taking differently: Take 145 mg by mouth daily.)   fluticasone  (FLONASE ) 50 MCG/ACT nasal spray Place 2 sprays into both nostrils 2 (two) times daily. (Patient taking differently: Place 2 sprays into both nostrils daily.)   fluticasone -salmeterol (ADVAIR) 500-50 MCG/ACT AEPB Inhale 1 puff into the lungs in the morning and at bedtime.   gabapentin  (NEURONTIN ) 300 MG capsule Take 1 capsule (300 mg total) by mouth 3 (three) times daily.   linaclotide (LINZESS) 72 MCG capsule Take 72 mcg by mouth daily before breakfast. (Patient taking differently: Take 72 mcg by mouth daily as needed.)   Magnesium 250 MG CAPS Take 1 capsule by mouth daily at 6 (six) AM.   meloxicam (MOBIC) 15 MG tablet Take 15 mg by mouth daily.   montelukast  (SINGULAIR ) 10 MG tablet TAKE 1 TABLET BY MOUTH AT BEDTIME   omeprazole (PRILOSEC) 20 MG capsule Take 20 mg by mouth in the morning and at bedtime. (Patient taking differently: Take 40 mg by mouth in the morning and at bedtime.)   tadalafil (CIALIS) 10 MG tablet Take 10 mg by mouth daily.   Tiotropium Bromide Monohydrate  (SPIRIVA  RESPIMAT) 2.5 MCG/ACT AERS INHALE 2 PUFFS BY MOUTH DAILY   traZODone  (DESYREL ) 150 MG tablet TAKE 1 TABLET BY MOUTH EVERYDAY AT BEDTIME (Patient taking differently: Take 300 mg by mouth at bedtime. TAKE 1 TABLET BY MOUTH EVERYDAY AT BEDTIME)   valsartan  (DIOVAN ) 160 MG tablet TAKE 1 TABLET BY MOUTH EVERY DAY (Patient taking differently: Take 320 mg by mouth daily. TAKE 1 TABLET BY MOUTH EVERY DAY)   venlafaxine XR (EFFEXOR-XR) 75 MG 24 hr capsule Take 75 mg by mouth daily with breakfast. (Patient taking differently: Take 150 mg by mouth daily with breakfast.)   Zinc 50 MG TABS Take 1 tablet by mouth daily at 6 (six) AM.     Allergies:   Amitriptyline, Silenor [doxepin hcl], Tribenzor [olmesartan-amlodipine-hctz], Bupropion, Dexmethylphenidate, Rimegepant sulfate, and  Topiramate   Social History   Tobacco Use   Smoking status: Former    Current packs/day: 0.00    Average packs/day: 0.3 packs/day for 5.0 years (1.3 ttl pk-yrs)    Types: Cigarettes    Start date: 03/02/1985    Quit date: 03/02/1990    Years since quitting: 33.7   Smokeless tobacco: Never  Vaping Use   Vaping status: Never Used  Substance Use Topics   Alcohol use: Not Currently    Comment: occasionally   Drug use: No     Family Hx: The patient's family history includes Allergies in his sister; Alzheimer's disease in his paternal aunt and paternal uncle; COPD in his maternal grandfather; Cancer - Prostate  in his father and maternal grandfather; Heart disease in his maternal grandmother and paternal grandmother; Parkinson's disease in his maternal uncle; Prostate cancer in his father and maternal grandfather.  ROS   EKGs/Labs/Other Test Reviewed:    EKG:  11/29/2023 NSR rate 75 normal   Recent Labs: 04/21/2023: ALT 21; BUN 18; Creatinine, Ser 0.96; Hemoglobin 13.4; Platelets 170; Potassium 3.4; Sodium 136   Recent Lipid Panel No results found for: CHOL, TRIG, HDL, CHOLHDL, LDLCALC, LDLDIRECT  Labs obtained through [facility] - personally reviewed and interpreted: 01/18/2020: TC 146, HDL 44, LDL 82, triglycerides 107, creatinine 0.91, potassium 4, ALT 15  Risk Assessment/Calculations:      Physical Exam:    VS:  BP 136/80 (BP Location: Left Arm, Patient Position: Sitting, Cuff Size: Normal)   Pulse 87   Ht 5' 10 (1.778 m)   Wt 168 lb 3.2 oz (76.3 kg)   SpO2 98%   BMI 24.13 kg/m     Wt Readings from Last 3 Encounters:  11/29/23 168 lb 3.2 oz (76.3 kg)  11/10/23 167 lb 6.4 oz (75.9 kg)  09/21/23 161 lb (73 kg)     Affect appropriate Healthy:  appears stated age HEENT: normal Neck supple with no adenopathy JVP normal no bruits no thyromegaly Lungs clear with no wheezing and good diaphragmatic motion Heart:  S1/S2 no murmur, no rub, gallop or click PMI  normal Abdomen: benighn, BS positve, no tenderness, no AAA no bruit.  No HSM or HJR post cholecystectomy and hernia repair  Distal pulses intact with no bruits No edema Neuro non-focal Skin warm and dry No muscular weakness        ASSESSMENT & PLAN:    1. Essential hypertension -Borderline control  - readings at home can be elevated - continue valsartan  - PRN norvasc 5 mg and f/u with primary   2. Mixed hyperlipidemia Most recent lipid panel looks optimal.  This is managed by primary care.  He currently takes fenofibrate.  3. Family history of early CAD CT in 2020 demonstrated calcium score of 0.  He is doing well without anginal symptoms.  Electrocardiogram is normal.  Update calcium score  4. Dyspnea:  sees pulmonary without PE/parenchymal lung dx. PFTls ok using inhalers ? Asthma. Update echo  and do cardiopulmonary stress test to see if functional, or limited by lungs/CO  5. Headaches:  f/u neuro no pathology on MRI 07/04/23     Calcium Score Echo for dyspnea  Cardiopulmonary stress test  Dispo:  F/U in a year  Medication Adjustments/Labs and Tests Ordered: Current medicines are reviewed at length with the patient today.  Concerns regarding medicines are outlined above.  Tests Ordered:  Calcium score   Medication Changes: No orders of the defined types were placed in this encounter.   Signed, Maude Emmer, MD  11/29/2023 8:19 AM    [facility] [address] [place], [place], [place]  [address] Phone: [phone]; Fax: [phone]

## 2023-11-17 ENCOUNTER — Other Ambulatory Visit: Payer: Self-pay

## 2023-11-17 NOTE — Progress Notes (Signed)
 Specialty Pharmacy Refill Coordination Note  Julian Oneal is a 58 y.o. male contacted today regarding refills of specialty medication(s) Dupilumab  (Dupixent )   Patient requested Delivery   Delivery date: 12/02/23   Verified address: [address]   [place] [address]   Medication will be filled on 10.01.25.

## 2023-11-29 ENCOUNTER — Ambulatory Visit: Attending: Cardiovascular Disease | Admitting: Cardiovascular Disease

## 2023-11-29 ENCOUNTER — Encounter: Payer: Self-pay | Admitting: Cardiovascular Disease

## 2023-11-29 VITALS — BP 136/80 | HR 87 | Ht 70.0 in | Wt 168.2 lb

## 2023-11-29 DIAGNOSIS — E782 Mixed hyperlipidemia: Secondary | ICD-10-CM | POA: Diagnosis present

## 2023-11-29 DIAGNOSIS — I10 Essential (primary) hypertension: Secondary | ICD-10-CM | POA: Diagnosis present

## 2023-11-29 DIAGNOSIS — R0609 Other forms of dyspnea: Secondary | ICD-10-CM | POA: Insufficient documentation

## 2023-11-29 MED ORDER — AMLODIPINE BESYLATE 5 MG PO TABS: 5.0000 mg | ORAL_TABLET | Freq: Every day | ORAL | 3 refills | Status: AC | PRN

## 2023-11-29 NOTE — Patient Instructions (Signed)
 Medication Instructions:  Please take Amlodipine 5 mg daily as needed for blood pressure greater than 165 systolic. Continue all other medications as listed.  *If you need a refill on your cardiac medications before your next appointment, please call your pharmacy*  Testing/Procedures: Your physician has requested that you have an echocardiogram. Echocardiography is a painless test that uses sound waves to create images of your heart. It provides your doctor with information about the size and shape of your heart and how well your heart's chambers and valves are working. This procedure takes approximately one hour. There are no restrictions for this procedure. Please do NOT wear cologne, perfume, aftershave, or lotions (deodorant is allowed). Please arrive 15 minutes prior to your appointment time.  Please note: We ask at that you not bring children with you during ultrasound (echo/ vascular) testing. Due to room size and safety concerns, children are not allowed in the ultrasound rooms during exams. Our front office staff cannot provide observation of children in our lobby area while testing is being conducted. An adult accompanying a patient to their appointment will only be allowed in the ultrasound room at the discretion of the ultrasound technician under special circumstances. We apologize for any inconvenience.  Your physician has requested that you have a Coronary Calcium score which is completed by CT. Cardiac computed tomography (CT) is a painless test that uses an x-ray machine to take clear, detailed pictures of your heart. There are no instructions for this testing.  You may eat/drink and take your normal medications this day.  The cost of the testing is $99 due at the time of your appointment.  Your physician has recommended that you have a cardiopulmonary stress test (CPX). CPX testing is a non-invasive measurement of heart and lung function. It replaces a traditional treadmill stress  test. This type of test provides a tremendous amount of information that relates not only to your present condition but also for future outcomes. This test combines measurements of you ventilation, respiratory gas exchange in the lungs, electrocardiogram (EKG), blood pressure and physical response before, during, and following an exercise protocol. This testing is completed at [facility].  Follow-Up: At [facility], you and your health needs are our priority.  As part of our continuing mission to provide you with exceptional heart care, our providers are all part of one team.  This team includes your primary Cardiologist (physician) and Advanced Practice Providers or APPs (Physician Assistants and Nurse Practitioners) who all work together to provide you with the care you need, when you need it.  Your next appointment:   1 year(s)  Provider:   Maude Emmer, MD    We recommend signing up for the patient portal called MyChart.  Sign up information is provided on this After Visit Summary.  MyChart is used to connect with patients for Virtual Visits (Telemedicine).  Patients are able to view lab/test results, encounter notes, upcoming appointments, etc.  Non-urgent messages can be sent to your provider as well.   To learn more about what you can do with MyChart, go to [email or web address].

## 2023-12-01 ENCOUNTER — Other Ambulatory Visit: Payer: Self-pay

## 2023-12-08 ENCOUNTER — Encounter (HOSPITAL_COMMUNITY): Payer: Self-pay

## 2023-12-08 ENCOUNTER — Ambulatory Visit (HOSPITAL_COMMUNITY): Attending: Cardiovascular Disease

## 2023-12-08 DIAGNOSIS — R0609 Other forms of dyspnea: Secondary | ICD-10-CM | POA: Diagnosis present

## 2023-12-13 DIAGNOSIS — R0609 Other forms of dyspnea: Secondary | ICD-10-CM | POA: Diagnosis not present

## 2023-12-16 ENCOUNTER — Other Ambulatory Visit (HOSPITAL_COMMUNITY): Payer: Self-pay

## 2023-12-20 ENCOUNTER — Ambulatory Visit (HOSPITAL_COMMUNITY)
Admission: RE | Admit: 2023-12-20 | Discharge: 2023-12-20 | Disposition: A | Discharge status: Home / Self Care | Payer: Self-pay | Source: Ambulatory Visit | Attending: Cardiovascular Disease | Admitting: Cardiovascular Disease

## 2023-12-20 ENCOUNTER — Ambulatory Visit (HOSPITAL_COMMUNITY)
Admission: RE | Admit: 2023-12-20 | Discharge: 2023-12-20 | Disposition: A | Discharge status: Home / Self Care | Source: Ambulatory Visit | Attending: Cardiovascular Disease | Admitting: Cardiovascular Disease

## 2023-12-20 ENCOUNTER — Telehealth: Payer: Self-pay

## 2023-12-20 ENCOUNTER — Ambulatory Visit: Payer: Self-pay | Admitting: Cardiovascular Disease

## 2023-12-20 DIAGNOSIS — R0609 Other forms of dyspnea: Secondary | ICD-10-CM | POA: Insufficient documentation

## 2023-12-20 DIAGNOSIS — I700 Atherosclerosis of aorta: Secondary | ICD-10-CM | POA: Insufficient documentation

## 2023-12-20 LAB — ECHOCARDIOGRAM COMPLETE
Area-P 1/2: 3.39 cm2
S' Lateral: 3 cm

## 2023-12-20 NOTE — Telephone Encounter (Signed)
*  Pulm  Pharmacy Patient Advocate Encounter   Received notification from CoverMyMeds that prior authorization for Airsupra  90-80MCG/ACT aerosol  is required/requested.   Insurance verification completed.   The patient is insured through [facility].   Nothing further needed. Medication discontinued.

## 2023-12-22 ENCOUNTER — Other Ambulatory Visit: Payer: Self-pay

## 2023-12-22 ENCOUNTER — Encounter: Payer: Self-pay | Admitting: Cardiovascular Disease

## 2023-12-23 ENCOUNTER — Encounter (INDEPENDENT_AMBULATORY_CARE_PROVIDER_SITE_OTHER): Payer: Self-pay

## 2023-12-24 ENCOUNTER — Other Ambulatory Visit: Payer: Self-pay

## 2023-12-24 NOTE — Progress Notes (Signed)
 Specialty Pharmacy Refill Coordination Note  Julian Oneal is a 58 y.o. male contacted today regarding refills of specialty medication(s) Dupilumab  (Dupixent )   Patient requested (Patient-Rptd) Delivery   Delivery date: 12/28/23   Verified address: (Patient-Rptd) [address] [place]   Medication will be filled on 12/27/23.

## 2023-12-27 ENCOUNTER — Other Ambulatory Visit: Payer: Self-pay

## 2024-01-17 ENCOUNTER — Encounter (INDEPENDENT_AMBULATORY_CARE_PROVIDER_SITE_OTHER): Payer: Self-pay

## 2024-01-17 ENCOUNTER — Other Ambulatory Visit: Payer: Self-pay

## 2024-01-17 NOTE — Progress Notes (Signed)
 Specialty Pharmacy Refill Coordination Note  Julian Oneal is a 58 y.o. male contacted today regarding refills of specialty medication(s) Dupilumab  (Dupixent )   Patient requested Delivery   Delivery date: 02/02/24   Verified address: [address] [place] [address]   Medication will be filled on: 02/01/24

## 2024-01-31 ENCOUNTER — Ambulatory Visit: Admitting: Pulmonary Disease

## 2024-01-31 ENCOUNTER — Encounter: Payer: Self-pay | Admitting: Pulmonary Disease

## 2024-01-31 VITALS — BP 134/86 | HR 71 | Temp 97.9°F | Ht 70.0 in | Wt 166.6 lb

## 2024-01-31 DIAGNOSIS — R0609 Other forms of dyspnea: Secondary | ICD-10-CM

## 2024-01-31 NOTE — Patient Instructions (Signed)
 Neck to see you again  Okay to stay off Advair to see if it makes any difference better or worse  No other changes to medicine  I ordered a CT scan to further evaluate the lungs and make sure not missing something else  We should consider a right heart catheterization to evaluate pulmonary hypertension in the future  Return to clinic in 1 month or sooner if needed with Dr. Annella

## 2024-01-31 NOTE — Progress Notes (Unsigned)
 @Patient  ID: Julian Oneal, male    DOB: 1966-01-08, 58 y.o.   MRN: [id number]  Chief Complaint  Patient presents with  . Asthma    Follow up stress test,with new provider-dr.desai patient    Referring provider: Loreli Kins, MD  HPI:   58 y.o. man with presumed history of severe persistent asthma on biologic therapy via Dupixent  and triple inhaled therapy presenting for evaluation of ongoing dyspnea on exertion.  Most recent pulmonary note, Dr. Meade, reviewed.  Presents to establish care with new pulmonologist.  History of asthma.  Worsening symptoms dyspnea for last several months.  I put on prednisone  for 3 days and this seemed to help.  Unclear if help with breathing or just energy.  He was seen in the interim by Dr. Meade.  Added budesonide  to his regimen of Advair high-dose Diskus and Spiriva .  Not sure is really helped much.  He mentions Advair he coughs a lot, not sure he is actually getting into his lungs.  Has been without for now for several days with no worsening.  He is continue Dupixent .  He does feel like it is more helpful now.  But he feels like it wears off on day 10 or 11.  2 or 3 days where he has worsening symptoms between injections and gets better.  In interim he had a repeat TTE that showed RV enlargement and mild MVR, compared to prior TTE, mild MVR is persistent.  Had a CPET in the interim that showed her demonstrated findings consistent with bronchospasm given development of fixed obstruction, severe fixed obstruction on postexercise spirometry.  HPI initial visit with me. Patient notes worsening dyspnea over the last month or so.  May be starting after fall.  Has had multiple falls.  Being worked up by neurologist.  Per prior notes in his report was using albuterol  4-5 times a day at baseline.  Now up to 8 times a day.  Minimal relief.  Does not think inhalers are helping.  Not sure Dupixent  is helping either.  Baseline dyspnea is severe but certainly worse over the  last month or 2.  His lungs are clear.  We discussed possible PE, would need to evaluate with CTA PE protocol as it is possible after fall and fracture with increased inflammatory state VTE occurred which could have traveled to lungs.  He declines this today.  We discussed prednisone  therapy to see if it helps, if asthma exacerbation it should help.  If discouraging increase in the inhaler use has not helped.  Also discussed in the past with abnormal echocardiogram particular with MVR moderate 2019.  Really lost to follow-up.  He says cardiology is retired.  He is accepting of a referral to cardiology today.  No sign of volume overload or florid heart failure on exam.  Questionaires / Pulmonary Flowsheets:   ACT:  Asthma Control Test ACT Total Score  11/10/2023  9:50 AM 7  07/07/2023  8:38 AM 9  11/12/2022  8:38 AM 6    MMRC:     No data to display          Epworth:      No data to display          Tests:   FENO:  No results found for: NITRICOXIDE  PFT:    Latest Ref Rng & Units 08/26/2022    2:00 PM 10/15/2014    9:55 AM  PFT Results  FVC-Pre L  3.83   FVC-Predicted Pre %  70  76   FVC-Post L 3.37  3.88   FVC-Predicted Post % 68  77   Pre FEV1/FVC % % 85  82   Post FEV1/FCV % % 86  88   FEV1-Pre L 2.93  3.12   FEV1-Predicted Pre % 78  80   FEV1-Post L 2.91  3.40   DLCO uncorrected ml/min/mmHg 29.25  26.46   DLCO UNC% % 103  83   DLCO corrected ml/min/mmHg 29.25    DLCO COR %Predicted % 103    DLVA Predicted % 117  124   TLC L 5.11  5.01   TLC % Predicted % 73  73   RV % Predicted % 101  72   Percent reviewed, 2024 PFTs interpreted as no fixed obstruction, no significant bronchodilator response, lung volumes consistent with air trapping  WALK:     08/28/2014   10:25 AM  SIX MIN WALK  Supplimental Oxygen during Test? (L/min) No  Tech Comments: Pt's lowest ambulatory sat was 94 on RA. Pt walked at a fast pace with no breaks.    Imaging: Personally  reviewed and as per EMR in discussion of this note No results found.  Lab Results: Personally reviewed CBC    Component Value Date/Time   WBC 6.0 04/21/2023 0208   RBC 4.37 04/21/2023 0208   HGB 13.4 04/21/2023 0208   HCT 38.3 (L) 04/21/2023 0208   PLT 170 04/21/2023 0208   MCV 87.6 04/21/2023 0208   MCH 30.7 04/21/2023 0208   MCHC 35.0 04/21/2023 0208   RDW 12.1 04/21/2023 0208   LYMPHSABS 1.8 04/21/2023 0208   MONOABS 0.7 04/21/2023 0208   EOSABS 0.0 04/21/2023 0208   BASOSABS 0.0 04/21/2023 0208    BMET    Component Value Date/Time   NA 136 04/21/2023 0208   K 3.4 (L) 04/21/2023 0208   CL 103 04/21/2023 0208   CO2 25 04/21/2023 0208   GLUCOSE 110 (H) 04/21/2023 0208   BUN 18 04/21/2023 0208   CREATININE 0.96 04/21/2023 0208   CALCIUM 9.2 04/21/2023 0208   GFRNONAA >60 04/21/2023 0208   GFRAA  02/06/2008 1045    >60        The eGFR has been calculated using the MDRD equation. This calculation has not been validated in all clinical    BNP No results found for: BNP  ProBNP No results found for: PROBNP  Specialty Problems       Pulmonary Problems   Obstructive sleep apnea   Patient diagnosed with OSA and using CPAP at 6 cm water , with residual AHI of 0.8 on 07-04-12 . [facility] is his  DME .       Sleep apnea with use of continuous positive airway pressure (CPAP)   Patient continues on CPAP for the treatment of obstructive sleep apnea, percent compliance. Epworth sleepiness score and Doris on 08-02-12 at 15 points. Patient has continued chief complaint of insomnia and severe left-sided headaches. Sleep study 08-4011 CPAP titration after AHI of 28 and supine AHI of 45. Titrated to 6 cm water.      Pneumonia, viral   Dyspnea   Followed in Pulmonary clinic/ [facility]/ [facility]  - 08/28/2014  Walked RA x 3 laps @ 185 ft each stopped due to end of study, fast pace, no doe or desats    - trial off acei 08/28/2014 >> improved 10/15/2014  - PFT's  10/21/2014   FEV1 3.40 (87 % ) ratio 88  p 6 %  improvement from saba with DLCO  83 % corrects to 124 % for alv volume        OSA on CPAP   Deviated nasal septum   Recurrent sinusitis   Hypertrophy of both inferior nasal turbinates   Nasal congestion   Chronic maxillary sinusitis   Rhinitis medicamentosa    Allergies  Allergen Reactions  . Amitriptyline     headaches  . Silenor [Doxepin Hcl]     headaches  . Tribenzor [Olmesartan-Amlodipine -Hctz]     headaches  . Bupropion     Other Reaction(s): tremors  . Dexmethylphenidate     Other Reaction(s): headaches  . Latex Dermatitis  . Rimegepant Sulfate     Other Reaction(s): ineffective  . Topiramate     Severe headaches, memory loss    Immunization History  Administered Date(s) Administered  . Fluzone Influenza virus vaccine,trivalent (IIV3), split virus 12/21/2012, 11/30/2016, 11/30/2017, 12/01/2018, 12/14/2019, 10/09/2021, 11/30/2023  . Influenza Inj Mdck Quad Pf 12/30/2016, 11/30/2017  . Influenza Split 11/30/2013  . Influenza, Seasonal, Injecte, Preservative Fre 11/12/2022  . Influenza,inj,Quad PF,6+ Mos 12/25/2015  . Influenza,inj,quad, With Preservative 03/01/2014, 10/31/2014  . Influenza-Unspecified 12/01/2018, 10/31/2021  . Moderna Sars-Covid-2 Vaccination 03/06/2019, 04/03/2019, 01/01/2020  . Pneumococcal-Unspecified 11/30/2023  . Td (Adult) 03/02/2002  . Tdap 01/28/2010, 03/24/2014, 08/04/2019  . Zoster, Live 12/01/2018    Past Medical History:  Diagnosis Date  . Bell's palsy 2008   Dr Jenel  . Dyspnea 08/28/2014   Followed in Pulmonary clinic/ [facility]/ [facility]  - 08/28/2014  Walked RA x 3 laps @ 185 ft each stopped due to end of study, fast pace, no doe or desats    - trial off acei 08/28/2014 >> improved 10/15/2014  - PFT's  10/21/2014  FEV1 3.40 (87 % ) ratio 88  p 6 % improvement from saba with DLCO  83 % corrects to 124 % for alv volume     . Excessive daytime sleepiness 04/04/2015  . Headache(784.0)   .  Headaches, cluster 06/20/2013  . High cholesterol   . Hypertension   . Insomnia   . Obstructive sleep apnea 07/05/2012   Patient diagnosed with OSA and using CPAP at 6 cm water , with residual AHI of 0.8 on 07-04-12 . [facility] is his  DME .    SABRA Persistent headaches 07/05/2012    [facility]. Oneita in 2013 -    . Pneumonia, viral 06/20/2013  . Taste absent     Tobacco History: Social History   Tobacco Use  Smoking Status Former  . Current packs/day: 0.00  . Average packs/day: 0.3 packs/day for 5.0 years (1.3 ttl pk-yrs)  . Types: Cigarettes  . Start date: 03/02/1985  . Quit date: 03/02/1990  . Years since quitting: 33.9  Smokeless Tobacco Never   Counseling given: Not Answered   Continue to not smoke  Outpatient Encounter Medications as of 01/31/2024  Medication Sig  . albuterol  (PROVENTIL ) (2.5 MG/3ML) 0.083% nebulizer solution Take 3 mLs (2.5 mg total) by nebulization every 6 (six) hours as needed for wheezing or shortness of breath.  . albuterol  (VENTOLIN  HFA) 108 (90 Base) MCG/ACT inhaler Inhale 2 puffs into the lungs every 4 (four) hours as needed.  SABRA alprazolam (XANAX) 2 MG tablet Take 2 mg by mouth at bedtime. (Patient taking differently: Take 1 mg by mouth at bedtime.)  . amLODipine  (NORVASC ) 5 MG tablet Take 1 tablet (5 mg total) by mouth daily as needed.  SABRA azelastine (  ASTELIN) 0.1 % nasal spray Place 2 sprays into both nostrils 2 (two) times daily. Use in each nostril as directed  . budesonide  (PULMICORT ) 0.5 MG/2ML nebulizer solution Take 2 mLs (0.5 mg total) by nebulization daily.  . cetirizine  (ZYRTEC ) 10 MG tablet Take 10 mg by mouth daily as needed.   . diclofenac  Sodium (VOLTAREN  ARTHRITIS PAIN) 1 % GEL Apply topically 4 (four) times daily.  . Dupilumab  (DUPIXENT ) 300 MG/2ML SOAJ Inject 300 mg into the skin every 14 (fourteen) days.  SABRA EPINEPHrine 0.3 mg/0.3 mL IJ SOAJ injection Inject into the muscle.  . fenofibrate 160 MG  tablet Take 160 mg by mouth daily.  . fluticasone  (FLONASE ) 50 MCG/ACT nasal spray Place 2 sprays into both nostrils 2 (two) times daily. (Patient taking differently: Place 2 sprays into both nostrils daily.)  . fluticasone -salmeterol (ADVAIR) 500-50 MCG/ACT AEPB Inhale 1 puff into the lungs in the morning and at bedtime.  . gabapentin  (NEURONTIN ) 300 MG capsule Take 1 capsule (300 mg total) by mouth 3 (three) times daily.  SABRA linaclotide (LINZESS) 72 MCG capsule Take 72 mcg by mouth daily before breakfast.  . Magnesium 250 MG CAPS Take 1 capsule by mouth daily at 6 (six) AM.  . meloxicam (MOBIC) 15 MG tablet Take 15 mg by mouth daily.  . montelukast  (SINGULAIR ) 10 MG tablet TAKE 1 TABLET BY MOUTH AT BEDTIME  . omeprazole (PRILOSEC) 20 MG capsule Take 20 mg by mouth in the morning and at bedtime.  . tadalafil (CIALIS) 10 MG tablet Take 10 mg by mouth daily.  . Tiotropium Bromide Monohydrate  (SPIRIVA  RESPIMAT) 2.5 MCG/ACT AERS INHALE 2 PUFFS BY MOUTH DAILY  . traZODone  (DESYREL ) 150 MG tablet TAKE 1 TABLET BY MOUTH EVERYDAY AT BEDTIME  . valsartan  (DIOVAN ) 160 MG tablet TAKE 1 TABLET BY MOUTH EVERY DAY  . Zinc 50 MG TABS Take 1 tablet by mouth daily at 6 (six) AM.  . [DISCONTINUED] diclofenac  (VOLTAREN ) 50 MG EC tablet Take 1 tablet (50 mg total) by mouth daily as needed.  . [DISCONTINUED] venlafaxine XR (EFFEXOR-XR) 75 MG 24 hr capsule Take 75 mg by mouth daily with breakfast.   No facility-administered encounter medications on file as of 01/31/2024.     Review of Systems  Review of Systems  N/a Physical Exam  BP 134/86   Pulse 71   Temp 97.9 F (36.6 C) (Oral)   Ht 5' 10 (1.778 m)   Wt 166 lb 9.6 oz (75.6 kg)   SpO2 96%   BMI 23.90 kg/m   Wt Readings from Last 5 Encounters:  01/31/24 166 lb 9.6 oz (75.6 kg)  11/29/23 168 lb 3.2 oz (76.3 kg)  11/10/23 167 lb 6.4 oz (75.9 kg)  09/21/23 161 lb (73 kg)  09/08/23 159 lb (72.1 kg)    BMI Readings from Last 5 Encounters:   01/31/24 23.90 kg/m  11/29/23 24.13 kg/m  11/10/23 24.02 kg/m  09/21/23 23.10 kg/m  09/08/23 22.81 kg/m     Physical Exam ***   Assessment & Plan:   Severe persistent asthma: With minimal improvement of the last few months with escalation of triple inhaled therapy addition of nebulized budesonide , Dupixent .  Dupixent  does seem to help it wears off after 10 or 11 days.  Has been without Advair now with no worsening symptoms.  Okay to stay off Advair.  Continue Spiriva .  Continue Dupixent .  Assess response.  If need to resume ICS/LABA, if symptoms worsen, would recommend HFA, Symbicort  as worried dry powder  may not be effectively deposited given his reports of cough etc.  Dyspnea on exertion: No significant improvement despite escalation to triple inhaled therapy and biologic therapy for asthma.  CPET consistent with bronchospasm.  Not much more to offer other than pretreat with albuterol  prior to exertion.  Most recent TTE did demonstrate RV enlargement.  Pulmonary hypertension considered based on this finding.  Prior TTE with very mild restriction.  High-resolution CT ordered scan given report of upper lung discomfort with deep breaths.  Notably 10/24 CT scan is not concerning for any parenchymal findings.  More recent cardiac/coronary CT scan clear although it omits upper portion of lungs.  Recommend right heart catheterization, they preferred to get CT scan first and if no abnormality move forward at that point in time, consider referral to academic center given ongoing symptoms and no clear etiology or at least no ability to improve on current diagnoses.  Notably, improvement with Dupixent  albeit mild and improvement with prednisone  does argue for underlying asthma as primary driver.   Return in about 1 month (around 03/02/2024) for f/u Dr. Annella.   Julian JONELLE Annella, MD 01/31/2024   I spent 41 minutes in the care of the patient today face-to-face visit, review of records,  coordination of care.

## 2024-02-01 ENCOUNTER — Other Ambulatory Visit: Payer: Self-pay

## 2024-02-04 ENCOUNTER — Inpatient Hospital Stay: Admission: RE | Admit: 2024-02-04 | Discharge: 2024-02-04 | Attending: Pulmonary Disease | Admitting: Pulmonary Disease

## 2024-02-04 DIAGNOSIS — R0609 Other forms of dyspnea: Secondary | ICD-10-CM

## 2024-02-09 NOTE — Assessment & Plan Note (Addendum)
 On tadalafil 10 mg daily (+ as needed dose)   - Has been on for many years  We reviewed the basic tenets of erectile dysfunction management, including normal erectile physiology, common contributing factors, and the spectrum of therapeutic options. Conservative measures such as lifestyle modification, optimization of comorbid conditions, and avoidance of exacerbating medications were discussed. Pharmacologic options including PDE5 inhibitors, intracavernosal or intraurethral therapies, and vacuum erection devices were reviewed, as well as surgical approaches such as penile prosthesis implantation. All questions were answered.   - I think it be reasonable to transition to intracavernosal injections.  Reviewed ICI and standard Trimix injections, including pros/cons and possible side effects.  I would asked that he discontinue Cialis once he starts and not experiment with any unprescribed medications or other erectogenic supplements -Will send standard Trimix to compounding pharmacy, follow-up for ICI education

## 2024-02-09 NOTE — Progress Notes (Signed)
 02/10/2024 2:09 PM   Julian Oneal 1966/03/01 [id number]   HPI: 58 y.o. male here for initial evaluation of ED  Symptoms began: +5 years ago (worsened in the last 6-8 mo) Primary issue: attaining and maintaining an erection and anorgasmia Libido: normal Relationship w/ partner: good, long-term Fiance Rigidity: 50% - barely enough for penetrative intercourse SHIM: 7  Prior therapies:  10mg  tadalafil daily (has been on for many years, including additional PRN dose at times)   Has never seen a urologist No prior penoscrotal surgeries or GU conditions + Family history of metastatic prostate cancer in father  - He has not submitted any PSA in our system (pleased to have had normal prior screening)  Tobacco Use: Medium Risk (02/10/2024)   Patient History    Smoking Tobacco Use: Former    Smokeless Tobacco Use: Never    Passive Exposure: Not on file        PMH: Past Medical History:  Diagnosis Date   Bell's palsy 2008   Dr Jenel   Dyspnea 08/28/2014   Followed in Pulmonary clinic/ [facility]/ [facility]  - 08/28/2014  Walked RA x 3 laps @ 185 ft each stopped due to end of study, fast pace, no doe or desats    - trial off acei 08/28/2014 >> improved 10/15/2014  - PFT's  10/21/2014  FEV1 3.40 (87 % ) ratio 88  p 6 % improvement from saba with DLCO  83 % corrects to 124 % for alv volume      Excessive daytime sleepiness 04/04/2015   Headache(784.0)    Headaches, cluster 06/20/2013   High cholesterol    Hypertension    Insomnia    Obstructive sleep apnea 07/05/2012   Patient diagnosed with OSA and using CPAP at 6 cm water , with residual AHI of 0.8 on 07-04-12 . [facility] is his  DME .     Persistent headaches 07/05/2012    Referred [facility] Dr. Oneita in 2013 -     Pneumonia, viral 06/20/2013   Taste absent     Surgical History: Past Surgical History:  Procedure Laterality Date   CHOLECYSTECTOMY  03/02/2001   FOOT SURGERY  03/02/2006   HAND  SURGERY  07/2023   HERNIA REPAIR  [phone]   MOUTH SURGERY      Family History: Family History  Problem Relation Age of Onset   Prostate cancer Father    Cancer - Prostate Father    Allergies Sister    Heart disease Maternal Grandmother    Cancer - Prostate Maternal Grandfather    Prostate cancer Maternal Grandfather    COPD Maternal Grandfather    Heart disease Paternal Grandmother    Parkinson's disease Maternal Uncle    Alzheimer's disease Paternal Aunt        in their late 87's   Alzheimer's disease Paternal Uncle        in their late 47's    Social History:  reports that he quit smoking about 33 years ago. His smoking use included cigarettes. He started smoking about 38 years ago. He has a 1.3 pack-year smoking history. He has never used smokeless tobacco. He reports that he does not currently use alcohol. He reports that he does not use drugs.      Physical Exam: BP 135/86   Pulse 81   Ht 5' 10 (1.778 m)   Wt 172 lb (78 kg)   BMI 24.68 kg/m    Constitutional:  Alert  and oriented, No acute distress. Cardiovascular: No clubbing, cyanosis, or edema. Respiratory: Normal respiratory effort, no increased work of breathing. GI: Nondistended Skin: No rashes, bruises or suspicious lesions. Neurologic: Grossly intact, no focal deficits, moving all 4 extremities. Psychiatric: Normal mood and affect.  Laboratory Data: N/A   Pertinent Imaging: N/A    Assessment & Plan:    ED (erectile dysfunction) of organic origin Assessment & Plan: On tadalafil 10 mg daily (+ as needed dose)   - Has been on for many years  We reviewed the basic tenets of erectile dysfunction management, including normal erectile physiology, common contributing factors, and the spectrum of therapeutic options. Conservative measures such as lifestyle modification, optimization of comorbid conditions, and avoidance of exacerbating medications were discussed. Pharmacologic options including PDE5  inhibitors, intracavernosal or intraurethral therapies, and vacuum erection devices were reviewed, as well as surgical approaches such as penile prosthesis implantation. All questions were answered.   - I think it be reasonable to transition to intracavernosal injections.  Reviewed ICI and standard Trimix injections, including pros/cons and possible side effects.  I would asked that he discontinue Cialis once he starts and not experiment with any unprescribed medications or other erectogenic supplements -Will send standard Trimix to compounding pharmacy, follow-up for ICI education  Orders: -     PSA; Future  Encounter for screening prostate specific antigen (PSA) measurement Assessment & Plan: Family history of metastatic prostate cancer in father  -We do not have PSA data in our system  - Submit PSA screening test today  Orders: -     PSA; Future  Other orders -     NONFORMULARY OR COMPOUNDED ITEM; Trimix (30/1/10)-(Pap/Phent/PGE)  Test Dose: Vial 1ml  Qty #3 Refills 0  [facility] [phone] Fax [phone]  Dispense: 3 each; Refill: 6      Penne Skye, MD 02/10/2024  [facility] [address], [address] [place], [place] [address] [phone]

## 2024-02-10 ENCOUNTER — Encounter: Payer: Self-pay | Admitting: Urology

## 2024-02-10 ENCOUNTER — Ambulatory Visit: Admitting: Urology

## 2024-02-10 VITALS — BP 135/86 | HR 81 | Ht 70.0 in | Wt 172.0 lb

## 2024-02-10 DIAGNOSIS — Z125 Encounter for screening for malignant neoplasm of prostate: Secondary | ICD-10-CM | POA: Insufficient documentation

## 2024-02-10 DIAGNOSIS — N529 Male erectile dysfunction, unspecified: Secondary | ICD-10-CM | POA: Diagnosis not present

## 2024-02-10 MED ORDER — NONFORMULARY OR COMPOUNDED ITEM: 6 refills | Status: AC

## 2024-02-10 NOTE — Assessment & Plan Note (Signed)
 Family history of metastatic prostate cancer in father  -We do not have PSA data in our system  - Submit PSA screening test today

## 2024-02-11 LAB — PSA: Prostate Specific Ag, Serum: 1.2 ng/mL (ref 0.0–4.0)

## 2024-02-15 ENCOUNTER — Ambulatory Visit: Payer: Self-pay | Admitting: Pulmonary Disease

## 2024-02-16 ENCOUNTER — Ambulatory Visit

## 2024-02-16 ENCOUNTER — Ambulatory Visit: Admitting: Internal Medicine

## 2024-02-16 ENCOUNTER — Encounter: Payer: Self-pay | Admitting: Nurse Practitioner

## 2024-02-16 ENCOUNTER — Encounter (INDEPENDENT_AMBULATORY_CARE_PROVIDER_SITE_OTHER): Payer: Self-pay

## 2024-02-16 ENCOUNTER — Encounter

## 2024-02-16 ENCOUNTER — Ambulatory Visit: Admitting: Nurse Practitioner

## 2024-02-16 VITALS — BP 144/84 | HR 79 | Temp 97.9°F | Ht 70.0 in | Wt 170.0 lb

## 2024-02-16 DIAGNOSIS — R0602 Shortness of breath: Secondary | ICD-10-CM

## 2024-02-16 DIAGNOSIS — R0609 Other forms of dyspnea: Secondary | ICD-10-CM

## 2024-02-16 DIAGNOSIS — J310 Chronic rhinitis: Secondary | ICD-10-CM | POA: Diagnosis not present

## 2024-02-16 DIAGNOSIS — J4551 Severe persistent asthma with (acute) exacerbation: Secondary | ICD-10-CM

## 2024-02-16 DIAGNOSIS — Z87891 Personal history of nicotine dependence: Secondary | ICD-10-CM

## 2024-02-16 DIAGNOSIS — J383 Other diseases of vocal cords: Secondary | ICD-10-CM | POA: Diagnosis not present

## 2024-02-16 LAB — PULMONARY FUNCTION TEST
DL/VA % pred: 103 %
DL/VA: 4.42 ml/min/mmHg/L
DLCO unc % pred: 82 %
DLCO unc: 23.03 ml/min/mmHg
FEF 25-75 Post: 3.88 L/s
FEF 25-75 Pre: 3.15 L/s
FEF2575-%Change-Post: 23 %
FEF2575-%Pred-Post: 126 %
FEF2575-%Pred-Pre: 102 %
FEV1-%Change-Post: 5 %
FEV1-%Pred-Post: 81 %
FEV1-%Pred-Pre: 77 %
FEV1-Post: 3.01 L
FEV1-Pre: 2.84 L
FEV1FVC-%Change-Post: 0 %
FEV1FVC-%Pred-Pre: 108 %
FEV6-%Change-Post: 5 %
FEV6-%Pred-Post: 77 %
FEV6-%Pred-Pre: 74 %
FEV6-Post: 3.6 L
FEV6-Pre: 3.42 L
FEV6FVC-%Change-Post: 0 %
FEV6FVC-%Pred-Post: 104 %
FEV6FVC-%Pred-Pre: 104 %
FVC-%Change-Post: 5 %
FVC-%Pred-Post: 74 %
FVC-%Pred-Pre: 70 %
FVC-Post: 3.6 L
FVC-Pre: 3.43 L
Post FEV1/FVC ratio: 84 %
Post FEV6/FVC ratio: 100 %
Pre FEV1/FVC ratio: 83 %
Pre FEV6/FVC Ratio: 100 %
RV % pred: 82 %
RV: 1.82 L
TLC % pred: 74 %
TLC: 5.22 L

## 2024-02-16 LAB — CBC WITH DIFFERENTIAL/PLATELET
Basophils Absolute: 0 K/uL (ref 0.0–0.1)
Basophils Relative: 0.5 % (ref 0.0–3.0)
Eosinophils Absolute: 0.1 K/uL (ref 0.0–0.7)
Eosinophils Relative: 1.8 % (ref 0.0–5.0)
HCT: 39.4 % (ref 39.0–52.0)
Hemoglobin: 13.6 g/dL (ref 13.0–17.0)
Lymphocytes Relative: 33.5 % (ref 12.0–46.0)
Lymphs Abs: 1.4 K/uL (ref 0.7–4.0)
MCHC: 34.6 g/dL (ref 30.0–36.0)
MCV: 87.1 fl (ref 78.0–100.0)
Monocytes Absolute: 0.4 K/uL (ref 0.1–1.0)
Monocytes Relative: 8.9 % (ref 3.0–12.0)
Neutro Abs: 2.3 K/uL (ref 1.4–7.7)
Neutrophils Relative %: 55.3 % (ref 43.0–77.0)
Platelets: 162 K/uL (ref 150.0–400.0)
RBC: 4.52 Mil/uL (ref 4.22–5.81)
RDW: 12.8 % (ref 11.5–15.5)
WBC: 4.2 K/uL (ref 4.0–10.5)

## 2024-02-16 LAB — POCT EXHALED NITRIC OXIDE: FeNO level (ppb): 16 (ref ?–50)

## 2024-02-16 MED ORDER — PREDNISONE 20 MG PO TABS: 40.0000 mg | ORAL_TABLET | Freq: Every day | ORAL | 0 refills | Status: AC

## 2024-02-16 MED ORDER — BUDESONIDE-FORMOTEROL FUMARATE 160-4.5 MCG/ACT IN AERO: 2.0000 | INHALATION_SPRAY | Freq: Two times a day (BID) | RESPIRATORY_TRACT | 12 refills | Status: AC

## 2024-02-16 NOTE — Progress Notes (Signed)
 Full pft performed today

## 2024-02-16 NOTE — Patient Instructions (Addendum)
 Continue Albuterol  inhaler 2 puffs or 3 mL neb every 6 hours as needed for shortness of breath or wheezing. Notify if symptoms persist despite rescue inhaler/neb use. Use nebs 3-4 times a day until symptoms improve Continue Spiriva  2 puffs daily Continue astelin nasal spray 2 sprays each nostril Twice daily  Continue singulair  1 tab At bedtime  Continue zyrtec  1 tab daily Continue dupixent  injections as scheduled Continue budesonide  nebs 2 mL Twice daily as needed for cough, chest tightness, wheezing, shortness of breath    -Prednisone  40 mg daily for 5 days. Take in AM with food -Symbicort  2 puff Twice daily. Brush tongue and rinse mouth afterwards -Flonase  nasal spray 2 sprays each nostril daily    If your symptoms rapidly worsen, you have trouble talking or extreme difficulty breathing, or you're not getting relief from your nebulizer/rescue, go to the emergency department.  Recheck labs today  Referral to Ear, Nose and Throat to examine your vocal cords   Follow up with Dr. Annella as scheduled. If symptoms do not improve or worsen, please contact office for sooner follow up or seek emergency care.

## 2024-02-16 NOTE — Patient Instructions (Signed)
 Full pft performed today

## 2024-02-16 NOTE — Progress Notes (Signed)
 @Patient  ID: Julian Oneal, male    DOB: [birth year], 58 y.o.   MRN: [id number]  Chief Complaint  Patient presents with   Asthma    Tight in chest.  Dupixent  due Monday.    Referring provider: Meade Verdon RAMAN, MD  HPI: 58 year old male, former smoker followed for severe asthma on Dupixent . He is a patient of Dr. Leatha and last seen in office 01/31/2024. Past medical history significant for HTN, OSA on CPAP, insomnia, allergic rhinitis, GERD, anxiety.    TESTS/EVENTS:  08/26/2022 PFT: FVC 70, FEV1 78, ratio 86, TLC 73, DLCO 103. No BD 08/26/2022 eos 200, allergen panel negative  12/09/2022 CTA chest: no acute process  02/04/2024 HRCT chest: atherosclerosis. Lungs are clear. No ILD.  02/16/2024 PFT: FVC 70, FEV1 77, ratio 84, TLC 74, DLCO 82  01/31/2024: OV with Dr. Annella. Hx of asthma. Worsening symptoms for last several months. Prednisone  seemed to help. Added budesonide . Not sure if it's helped much. Feels Advair makes him cough more. Feels Dupixent  helps but wears off around day 10 or 11. TTE showed RV enlargement and mild MVR. Had CPET that showed findings consistent with bronchospasm given development of fixed obstruction. Ok to stay off Advair. May need to resume ICS/LABA. HRCT chest ordered. Repeat PFT ordered. Improvement with dupixent  and improvement with prednisone  argues for underlying asthma as driving factor.  02/16/2024: Today - follow up Discussed the use of AI scribe software for clinical note transcription with the patient, who gave verbal consent to proceed.  History of Present Illness Julian Oneal is a 58 year old male with asthma who presents for follow-up of his respiratory symptoms.  He had PFT today, which is stable compared to prior. He has a moderate restriction with normal diffusion capacity. He had a HRCT chest without any abnormalities.   He experiences ongoing respiratory symptoms, including daily use of albuterol  and a persistent dry cough. He has been  on Dupixent  for approximately four to five months and is due for his next dose on Monday. He also uses Spiriva  to manage his symptoms. He experiences wheezing. Has a lot of chest tightness, which gets better with his Dupixent . No fever or hemoptysis. No leg swelling, orthopnea, PND, CP, palpitations, weight loss, anorexia, lightheadedness/dizziness.   His sinuses are always a little stuffy, and he uses azelastine nasal spray. He has previously used Flonase  without any issues but is not currently using it. He has chronic voice hoarseness.   His lung function is relatively stable compared to nine years ago.  No results found for: NITRICOXIDE FeNO level (ppb)  Date/Time Value Ref Range Status  02/16/2024 09:33 AM 16 <25 - >50 Final   This result suggests low (<25) Type 2 (T2) airway inflammation indicating a low likelihood of active T2-driven airway inflammation; reduced probability of response to inhaled corticosteroids.    Allergies[1]  Immunization History  Administered Date(s) Administered   Fluzone Influenza virus vaccine,trivalent (IIV3), split virus 12/21/2012, 11/30/2016, 11/30/2017, 12/01/2018, 12/14/2019, 10/09/2021, 11/30/2023   Influenza Inj Mdck Quad Pf 12/30/2016, 11/30/2017   Influenza Split 11/30/2013   Influenza, Seasonal, Injecte, Preservative Fre 11/12/2022   Influenza,inj,Quad PF,6+ Mos 12/25/2015   Influenza,inj,quad, With Preservative 03/01/2014, 10/31/2014   Influenza-Unspecified 12/01/2018, 10/31/2021   Moderna Sars-Covid-2 Vaccination 03/06/2019, 04/03/2019, 01/01/2020   Pneumococcal-Unspecified 11/30/2023   Td (Adult) 03/02/2002   Tdap 01/28/2010, 03/24/2014, 08/04/2019   Zoster, Live 12/01/2018    Past Medical History:  Diagnosis Date   Bell's palsy 2008  Dr Jenel   Dyspnea 08/28/2014   Followed in Pulmonary clinic/ [facility]/ [facility]  - 08/28/2014  Walked RA x 3 laps @ 185 ft each stopped due to end of study, fast pace, no doe or desats    -  trial off acei 08/28/2014 >> improved 10/15/2014  - PFT's  10/21/2014  FEV1 3.40 (87 % ) ratio 88  p 6 % improvement from saba with DLCO  83 % corrects to 124 % for alv volume      Excessive daytime sleepiness 04/04/2015   Headache(784.0)    Headaches, cluster 06/20/2013   High cholesterol    Hypertension    Insomnia    Obstructive sleep apnea 07/05/2012   Patient diagnosed with OSA and using CPAP at 6 cm water , with residual AHI of 0.8 on 07-04-12 . [employer] is his  DME .     Persistent headaches 07/05/2012    [facility] Dr. Oneita in 2013 -     Pneumonia, viral 06/20/2013   Taste absent     Tobacco History: Tobacco Use History[2] Counseling given: Not Answered   Outpatient Medications Prior to Visit  Medication Sig Dispense Refill   albuterol  (PROVENTIL ) (2.5 MG/3ML) 0.083% nebulizer solution Take 3 mLs (2.5 mg total) by nebulization every 6 (six) hours as needed for wheezing or shortness of breath. 75 mL 12   albuterol  (VENTOLIN  HFA) 108 (90 Base) MCG/ACT inhaler Inhale 2 puffs into the lungs every 4 (four) hours as needed. 1 each 5   alprazolam (XANAX) 2 MG tablet Take 2 mg by mouth at bedtime. (Patient taking differently: Take 1 mg by mouth at bedtime.)     amLODipine  (NORVASC ) 5 MG tablet Take 1 tablet (5 mg total) by mouth daily as needed. 30 tablet 3   azelastine (ASTELIN) 0.1 % nasal spray Place 2 sprays into both nostrils 2 (two) times daily. Use in each nostril as directed     budesonide  (PULMICORT ) 0.5 MG/2ML nebulizer solution Take 2 mLs (0.5 mg total) by nebulization daily. 60 mL 12   cetirizine  (ZYRTEC ) 10 MG tablet Take 10 mg by mouth daily as needed.      diclofenac  Sodium (VOLTAREN  ARTHRITIS PAIN) 1 % GEL Apply topically 4 (four) times daily.     Dupilumab  (DUPIXENT ) 300 MG/2ML SOAJ Inject 300 mg into the skin every 14 (fourteen) days. 12 mL 1   EPINEPHrine 0.3 mg/0.3 mL IJ SOAJ injection Inject into the muscle.     fenofibrate 160 MG  tablet Take 160 mg by mouth daily.     fluticasone  (FLONASE ) 50 MCG/ACT nasal spray Place 2 sprays into both nostrils 2 (two) times daily. (Patient taking differently: Place 2 sprays into both nostrils daily.) 16 g 6   gabapentin  (NEURONTIN ) 300 MG capsule Take 1 capsule (300 mg total) by mouth 3 (three) times daily. 90 capsule 11   linaclotide (LINZESS) 72 MCG capsule Take 72 mcg by mouth daily before breakfast.     Magnesium 250 MG CAPS Take 1 capsule by mouth daily at 6 (six) AM.     meloxicam (MOBIC) 15 MG tablet Take 15 mg by mouth daily.     montelukast  (SINGULAIR ) 10 MG tablet TAKE 1 TABLET BY MOUTH AT BEDTIME 90 tablet 3   NONFORMULARY OR COMPOUNDED ITEM Trimix (30/1/10)-(Pap/Phent/PGE)  Test Dose: Vial 1ml  Qty #3 Refills 0  [facility] [phone] Fax [phone] 3 each 6   omeprazole (PRILOSEC) 20 MG capsule Take 20 mg  by mouth in the morning and at bedtime.     tadalafil (CIALIS) 10 MG tablet Take 10 mg by mouth daily.  11   Tiotropium Bromide Monohydrate  (SPIRIVA  RESPIMAT) 2.5 MCG/ACT AERS INHALE 2 PUFFS BY MOUTH DAILY 4 g 11   traZODone  (DESYREL ) 150 MG tablet TAKE 1 TABLET BY MOUTH EVERYDAY AT BEDTIME 30 tablet 0   valsartan  (DIOVAN ) 160 MG tablet TAKE 1 TABLET BY MOUTH EVERY DAY 90 tablet 3   Zinc 50 MG TABS Take 1 tablet by mouth daily at 6 (six) AM.     fluticasone -salmeterol (ADVAIR) 500-50 MCG/ACT AEPB Inhale 1 puff into the lungs in the morning and at bedtime. 1 each 5   No facility-administered medications prior to visit.     Review of Systems: as above    Physical Exam:  BP (!) 144/84 (BP Location: Right Arm, Patient Position: Sitting, Cuff Size: Normal)   Pulse 79   Temp 97.9 F (36.6 C) (Oral)   Ht 5' 10 (1.778 m)   Wt 170 lb (77.1 kg)   SpO2 99% Comment: RA  BMI 24.39 kg/m   GEN: Pleasant, interactive, well-appearing; in no acute distress HEENT:  Normocephalic and atraumatic. PERRLA. Sclera white. Nasal turbinates pink, moist and  patent bilaterally. No rhinorrhea present. Oropharynx pink and moist, without exudate or edema. No lesions, ulcerations, or postnasal drip. Hoarse voice quality  NECK:  Supple w/ fair ROM. No JVD present. No lymphadenopathy.   CV: RRR, no m/r/g, no peripheral edema. Pulses intact, +2 bilaterally. No cyanosis, pallor or clubbing. PULMONARY:  Unlabored, regular breathing. Clear bilaterally A&P w/o wheezes/rales/rhonchi. No accessory muscle use.  GI: BS present and normoactive. Soft, non-tender to palpation.  MSK: No erythema, warmth or tenderness. Cap refil <2 sec all extrem.  Neuro: A/Ox3. No focal deficits noted.   Skin: Warm, no lesions or rashe Psych: Normal affect and behavior. Judgement and thought content appropriate.     Lab Results:  CBC    Component Value Date/Time   WBC 6.0 04/21/2023 0208   RBC 4.37 04/21/2023 0208   HGB 13.4 04/21/2023 0208   HCT 38.3 (L) 04/21/2023 0208   PLT 170 04/21/2023 0208   MCV 87.6 04/21/2023 0208   MCH 30.7 04/21/2023 0208   MCHC 35.0 04/21/2023 0208   RDW 12.1 04/21/2023 0208   LYMPHSABS 1.8 04/21/2023 0208   MONOABS 0.7 04/21/2023 0208   EOSABS 0.0 04/21/2023 0208   BASOSABS 0.0 04/21/2023 0208    BMET    Component Value Date/Time   NA 136 04/21/2023 0208   K 3.4 (L) 04/21/2023 0208   CL 103 04/21/2023 0208   CO2 25 04/21/2023 0208   GLUCOSE 110 (H) 04/21/2023 0208   BUN 18 04/21/2023 0208   CREATININE 0.96 04/21/2023 0208   CALCIUM 9.2 04/21/2023 0208   GFRNONAA >60 04/21/2023 0208   GFRAA  02/06/2008 1045    >60        The eGFR has been calculated using the MDRD equation. This calculation has not been validated in all clinical    BNP No results found for: BNP   Imaging:  CT Chest High Resolution Result Date: 02/04/2024 EXAM: HIGH RESOLUTION CHEST 02/04/2024 09:37:06 AM TECHNIQUE: CT of the chest was performed without the administration of intravenous contrast. High resolution CT imaging was performed of the lungs.  Multiplanar reformatted images are provided for review. Automated exposure control, iterative reconstruction, and/or weight based adjustment of the mA/kV was utilized to reduce the radiation dose to  as low as reasonably achievable. High resolution CT images were performed in the supine inspiration, supine expiration, and prone inspiration positions. COMPARISON: 12/09/2022 chest CT angiogram. 12/20/2023 coronary CT. CLINICAL HISTORY: Dyspnea on exertion, evaluate for interstitial lung disease. FINDINGS: MEDIASTINUM AND LYMPH NODES: Heart: Right coronary atherosclerosis. Pericardium is unremarkable. Mildly atherosclerotic nonaneurysmal thoracic aorta. No mediastinal, hilar or axillary lymphadenopathy. HRCT FINDINGS AND LUNGS AND PLEURA: No pneumothorax. No pleural effusion. The central airways are clear. No acute consolidative airspace disease. No lung masses. No significant pulmonary nodules. No significant lobulated air trapping or evidence of tracheobronchomalacia on the expiration sequence. No significant regions of subpleural reticulation, ground glass opacity, traction bronchiectasis, architectural distortion, parenchymal banding or frank honeycombing. UPPER ABDOMEN: Cholecystectomy. No acute abnormality. SOFT TISSUES AND BONES: Mild thoracic spondylosis. No acute abnormality of the soft tissues. IMPRESSION: 1. No interstitial lung disease. No acute cardiopulmonary process. 2. One vessel coronary atherosclerosis. Electronically signed by: Selinda Blue MD 02/04/2024 05:26 PM EST RP Workstation: [id number]    Administration History     None          Latest Ref Rng & Units 02/16/2024    8:14 AM 08/26/2022    2:00 PM 10/15/2014    9:55 AM  PFT Results  FVC-Pre L 3.43  P  3.83   FVC-Predicted Pre % 70  P 70  76   FVC-Post L 3.60  P 3.37  3.88   FVC-Predicted Post % 74  P 68  77   Pre FEV1/FVC % % 83  P 85  82   Post FEV1/FCV % % 84  P 86  88   FEV1-Pre L 2.84  P 2.93  3.12   FEV1-Predicted Pre % 77   P 78  80   FEV1-Post L 3.01  P 2.91  3.40   DLCO uncorrected ml/min/mmHg 23.03  P 29.25  26.46   DLCO UNC% % 82  P 103  83   DLCO corrected ml/min/mmHg  29.25    DLCO COR %Predicted %  103    DLVA Predicted % 103  P 117  124   TLC L 5.22  P 5.11  5.01   TLC % Predicted % 74  P 73  73   RV % Predicted % 82  P 101  72     P Preliminary result    No results found for: NITRICOXIDE      Assessment & Plan:    Assessment & Plan Severe persistent asthma with acute exacerbation Severe persistent asthma with worsening. No significant elevation in exhaled nitric oxide  but reports of dyspnea, chest tightness, wheezing. Will challenge him with prednisone  burst and reassess response. Resume high dose ICS/LABA with Symbicort  HFA. Side effect profile reviewed. Teachback performed. Question component of VCD contributing to symptoms given persistence despite aggressive asthma management. Will refer him to ENT.  Lung function testing shows restriction, but no formal obstruction. Stable compared to nine years ago. Action plan in place.  - Start Symbicort , two puffs in the morning and two puffs in the evening. - Continue Dupixent  and Spiriva . - Prescribed prednisone  40 mg daily for five days, to be taken in the morning with food. - Rechecked blood counts to monitor peripheral eosinophils   Vocal cord dysfunction Question vocal cord dysfunction contributing to symptoms. Referral to ENT for further evaluation with direct laryngoscopy is planned. - Referred to ENT for evaluation of vocal cords.  Chronic rhinitis Chronic allergic rhinitis with nasal stuffiness. Currently using azelastine (Astelin) nasal spray.  Previous allergen panel unremarkable.  - Added Flonase , two sprays in each nostril once a day, in addition to Astelin.  DOE See above. He has had extensive cardiac workup. Mild MVR. Possible PH with RV enlargement. Could consider RHC pending response/evaluation findings. Euvolemic on exam and  DLCO normal, which is reassuring.     Advised if symptoms do not improve or worsen, to please contact office for sooner follow up or seek emergency care.   I spent 45 minutes of dedicated to the care of this patient on the date of this encounter to include pre-visit review of records, face-to-face time with the patient discussing conditions above, post visit ordering of testing, clinical documentation with the electronic health record, making appropriate referrals as documented, and communicating necessary findings to members of the patients care team.  Comer LULLA Rouleau, NP 02/16/2024  Pt aware and understands NP's role.      [1]  Allergies Allergen Reactions   Amitriptyline     headaches   Silenor [Doxepin Hcl]     headaches   Tribenzor [Olmesartan-Amlodipine -Hctz]     headaches   Bupropion     Other Reaction(s): tremors   Dexmethylphenidate     Other Reaction(s): headaches   Latex Dermatitis   Rimegepant Sulfate     Other Reaction(s): ineffective   Topiramate     Severe headaches, memory loss  [2]  Social History Tobacco Use  Smoking Status Former   Current packs/day: 0.00   Average packs/day: 0.3 packs/day for 5.0 years (1.3 ttl pk-yrs)   Types: Cigarettes   Start date: 03/02/1985   Quit date: 03/02/1990   Years since quitting: 33.9  Smokeless Tobacco Never

## 2024-02-22 ENCOUNTER — Ambulatory Visit: Payer: Self-pay | Admitting: Nurse Practitioner

## 2024-02-23 ENCOUNTER — Other Ambulatory Visit: Payer: Self-pay | Admitting: Internal Medicine

## 2024-02-23 ENCOUNTER — Other Ambulatory Visit: Payer: Self-pay

## 2024-02-23 DIAGNOSIS — J4550 Severe persistent asthma, uncomplicated: Secondary | ICD-10-CM

## 2024-02-23 NOTE — Telephone Encounter (Signed)
 Pt requesting refill of specialty medication - routing to Rx team to advise.

## 2024-02-28 ENCOUNTER — Other Ambulatory Visit: Payer: Self-pay

## 2024-02-28 MED ORDER — DUPIXENT 300 MG/2ML ~~LOC~~ SOAJ
300.0000 mg | SUBCUTANEOUS | 2 refills | Status: AC
  Filled 2024-02-28 – 2024-02-29 (×2): qty 4, 28d supply, fill #0
  Filled 2024-03-24: qty 4, 28d supply, fill #1

## 2024-02-28 NOTE — Telephone Encounter (Signed)
 Refill sent for DUPIXENT  to [facility]: [phone]   Dose: 300mg  [place] every 14 days   Last OV: 02/16/24 Provider: Formerly Dr. Meade, transitioning to Dr. Annella  Next OV: 03/08/24  Aleck Puls, PharmD, BCPS Clinical Pharmacist  [facility]

## 2024-02-29 ENCOUNTER — Other Ambulatory Visit (HOSPITAL_COMMUNITY): Payer: Self-pay

## 2024-02-29 ENCOUNTER — Other Ambulatory Visit: Payer: Self-pay

## 2024-02-29 NOTE — Progress Notes (Signed)
 Specialty Pharmacy Refill Coordination Note  TRAVAS SCHEXNAYDER is a 58 y.o. male contacted today regarding refills of specialty medication(s) Dupilumab  (Dupixent )   Patient requested Delivery   Delivery date: 03/07/24   Verified address: [address] [place] [address]   Medication will be filled on: 03/06/24

## 2024-03-06 ENCOUNTER — Other Ambulatory Visit: Payer: Self-pay

## 2024-03-08 ENCOUNTER — Encounter: Payer: Self-pay | Admitting: Pulmonary Disease

## 2024-03-08 ENCOUNTER — Ambulatory Visit: Admitting: Pulmonary Disease

## 2024-03-08 VITALS — BP 144/87 | Temp 97.6°F | Resp 18 | Ht 70.0 in | Wt 169.0 lb

## 2024-03-08 DIAGNOSIS — J4550 Severe persistent asthma, uncomplicated: Secondary | ICD-10-CM

## 2024-03-08 DIAGNOSIS — R0609 Other forms of dyspnea: Secondary | ICD-10-CM

## 2024-03-08 MED ORDER — IPRATROPIUM BROMIDE HFA 17 MCG/ACT IN AERS: 2.0000 | INHALATION_SPRAY | RESPIRATORY_TRACT | 12 refills | Status: AC | PRN

## 2024-03-08 MED ORDER — IPRATROPIUM BROMIDE 0.02 % IN SOLN: 0.5000 mg | RESPIRATORY_TRACT | 6 refills | Status: AC | PRN

## 2024-03-08 NOTE — Patient Instructions (Signed)
 Nice to see you again   Stop albuterol , see if it helps your blood pressure  Instead use ipratropium rescue Hailer and ipratropium nebulizer every 4 hours as needed for wheezing or shortness of breath  No other changes in medication  We may need to have albuterol  back in combination with ipratropium, let me know if this is not very effective  May need to consider an every day steroid ideally at the lowest dose possible, let me know if you feel like we should do this before you see me next  Return to clinic in 3 months or sooner if needed with Dr. Annella

## 2024-03-11 NOTE — Progress Notes (Signed)
 "  @Patient  ID: Julian Oneal, male    DOB: 1965-10-20, 59 y.o.   MRN: [id number]  Chief Complaint  Patient presents with   Follow-up    SOB    Referring provider: Loreli Kins, MD  HPI:   59 y.o. man with presumed history of severe persistent asthma on biologic therapy via Dupixent  and triple inhaled therapy presenting for evaluation of ongoing dyspnea on exertion.  Most recent pulmonary note, Izetta Rouleau, NP, reviewed.  Unfortunately, symptoms unchanged.  Dupixent  does continue to help.  Quite a bit from prior experience.  But again it seems to wear off.  He was seen a few weeks ago.  Placed on prednisone  for exacerbation.  Helped.  But again back to baseline symptoms.  He feels the albuterol  rescue medications nebulizer and inhaler are no longer effective.  HPI initial visit with me. Patient notes worsening dyspnea over the last month or so.  May be starting after fall.  Has had multiple falls.  Being worked up by neurologist.  Per prior notes in his report was using albuterol  4-5 times a day at baseline.  Now up to 8 times a day.  Minimal relief.  Does not think inhalers are helping.  Not sure Dupixent  is helping either.  Baseline dyspnea is severe but certainly worse over the last month or 2.  His lungs are clear.  We discussed possible PE, would need to evaluate with CTA PE protocol as it is possible after fall and fracture with increased inflammatory state VTE occurred which could have traveled to lungs.  He declines this today.  We discussed prednisone  therapy to see if it helps, if asthma exacerbation it should help.  If discouraging increase in the inhaler use has not helped.  Also discussed in the past with abnormal echocardiogram particular with MVR moderate 2019.  Really lost to follow-up.  He says cardiology is retired.  He is accepting of a referral to cardiology today.  No sign of volume overload or florid heart failure on exam.  Questionaires / Pulmonary Flowsheets:   ACT:   Asthma Control Test ACT Total Score  02/16/2024  9:22 AM 11  11/10/2023  9:50 AM 7  07/07/2023  8:38 AM 9    MMRC:     No data to display          Epworth:      No data to display          Tests:   FENO:  No results found for: NITRICOXIDE  PFT:    Latest Ref Rng & Units 02/16/2024    8:14 AM 08/26/2022    2:00 PM 10/15/2014    9:55 AM  PFT Results  FVC-Pre L 3.43   3.83   FVC-Predicted Pre % 70  70  76   FVC-Post L 3.60  3.37  3.88   FVC-Predicted Post % 74  68  77   Pre FEV1/FVC % % 83  85  82   Post FEV1/FCV % % 84  86  88   FEV1-Pre L 2.84  2.93  3.12   FEV1-Predicted Pre % 77  78  80   FEV1-Post L 3.01  2.91  3.40   DLCO uncorrected ml/min/mmHg 23.03  29.25  26.46   DLCO UNC% % 82  103  83   DLCO corrected ml/min/mmHg  29.25    DLCO COR %Predicted %  103    DLVA Predicted % 103  117  124   TLC L  5.22  5.11  5.01   TLC % Predicted % 74  73  73   RV % Predicted % 82  101  72   Percent reviewed, 2024 PFTs interpreted as no fixed obstruction, no significant bronchodilator response, lung volumes consistent with air trapping  WALK:     08/28/2014   10:25 AM  SIX MIN WALK  Supplimental Oxygen during Test? (L/min) No  Tech Comments: Pt's lowest ambulatory sat was 94 on RA. Pt walked at a fast pace with no breaks.    Imaging: Personally reviewed and as per EMR in discussion of this note No results found.  Lab Results: Personally reviewed CBC    Component Value Date/Time   WBC 4.2 02/16/2024 0950   RBC 4.52 02/16/2024 0950   HGB 13.6 02/16/2024 0950   HCT 39.4 02/16/2024 0950   PLT 162.0 02/16/2024 0950   MCV 87.1 02/16/2024 0950   MCH 30.7 04/21/2023 0208   MCHC 34.6 02/16/2024 0950   RDW 12.8 02/16/2024 0950   LYMPHSABS 1.4 02/16/2024 0950   MONOABS 0.4 02/16/2024 0950   EOSABS 0.1 02/16/2024 0950   BASOSABS 0.0 02/16/2024 0950    BMET    Component Value Date/Time   NA 136 04/21/2023 0208   K 3.4 (L) 04/21/2023 0208   CL 103  04/21/2023 0208   CO2 25 04/21/2023 0208   GLUCOSE 110 (H) 04/21/2023 0208   BUN 18 04/21/2023 0208   CREATININE 0.96 04/21/2023 0208   CALCIUM 9.2 04/21/2023 0208   GFRNONAA >60 04/21/2023 0208   GFRAA  02/06/2008 1045    >60        The eGFR has been calculated using the MDRD equation. This calculation has not been validated in all clinical    BNP No results found for: BNP  ProBNP No results found for: PROBNP  Specialty Problems       Pulmonary Problems   Obstructive sleep apnea   Patient diagnosed with OSA and using CPAP at 6 cm water , with residual AHI of 0.8 on 07-04-12 . [facility] is his  DME .       Sleep apnea with use of continuous positive airway pressure (CPAP)   Patient continues on CPAP for the treatment of obstructive sleep apnea, percent compliance. Epworth sleepiness score and Doris on 08-02-12 at 15 points. Patient has continued chief complaint of insomnia and severe left-sided headaches. Sleep study 08-4011 CPAP titration after AHI of 28 and supine AHI of 45. Titrated to 6 cm water.      Pneumonia, viral   Dyspnea   Followed in Pulmonary clinic/ [facility]/ [facility]  - 08/28/2014  Walked RA x 3 laps @ 185 ft each stopped due to end of study, fast pace, no doe or desats    - trial off acei 08/28/2014 >> improved 10/15/2014  - PFT's  10/21/2014  FEV1 3.40 (87 % ) ratio 88  p 6 % improvement from saba with DLCO  83 % corrects to 124 % for alv volume        OSA on CPAP   Deviated nasal septum   Recurrent sinusitis   Hypertrophy of both inferior nasal turbinates   Nasal congestion   Chronic maxillary sinusitis   Rhinitis medicamentosa    Allergies  Allergen Reactions   Amitriptyline     headaches   Silenor [Doxepin Hcl]     headaches   Tribenzor [Olmesartan-Amlodipine -Hctz]     headaches   Bupropion     Other  Reaction(s): tremors   Dexmethylphenidate     Other Reaction(s): headaches   Latex Dermatitis   Rimegepant Sulfate     Other  Reaction(s): ineffective   Topiramate     Severe headaches, memory loss    Immunization History  Administered Date(s) Administered   Fluzone Influenza virus vaccine,trivalent (IIV3), split virus 12/21/2012, 11/30/2016, 11/30/2017, 12/01/2018, 12/14/2019, 10/09/2021, 11/30/2023   Influenza Inj Mdck Quad Pf 12/30/2016, 11/30/2017   Influenza Split 11/30/2013   Influenza, Seasonal, Injecte, Preservative Fre 11/12/2022   Influenza,inj,Quad PF,6+ Mos 12/25/2015   Influenza,inj,quad, With Preservative 03/01/2014, 10/31/2014   Influenza-Unspecified 12/01/2018, 10/31/2021   Moderna Sars-Covid-2 Vaccination 03/06/2019, 04/03/2019, 01/01/2020   Pneumococcal-Unspecified 11/30/2023   Td (Adult) 03/02/2002   Tdap 01/28/2010, 03/24/2014, 08/04/2019   Zoster, Live 12/01/2018    Past Medical History:  Diagnosis Date   Bell's palsy 2008   Dr Jenel   Dyspnea 08/28/2014   Followed in Pulmonary clinic/ [facility]/ [facility]  - 08/28/2014  Walked RA x 3 laps @ 185 ft each stopped due to end of study, fast pace, no doe or desats    - trial off acei 08/28/2014 >> improved 10/15/2014  - PFT's  10/21/2014  FEV1 3.40 (87 % ) ratio 88  p 6 % improvement from saba with DLCO  83 % corrects to 124 % for alv volume      Excessive daytime sleepiness 04/04/2015   Headache(784.0)    Headaches, cluster 06/20/2013   High cholesterol    Hypertension    Insomnia    Obstructive sleep apnea 07/05/2012   Patient diagnosed with OSA and using CPAP at 6 cm water , with residual AHI of 0.8 on 07-04-12 . [facility] is his  DME .     Persistent headaches 07/05/2012    Ref[facility] Dr. Oneita in 2013 -     Pneumonia, viral 06/20/2013   Taste absent     Tobacco History: Social History   Tobacco Use  Smoking Status Former   Current packs/day: 0.00   Average packs/day: 0.3 packs/day for 5.0 years (1.3 ttl pk-yrs)   Types: Cigarettes   Start date: 03/02/1985   Quit date: 03/02/1990   Years  since quitting: 34.0  Smokeless Tobacco Never   Counseling given: Not Answered   Continue to not smoke  Outpatient Encounter Medications as of 03/08/2024  Medication Sig   alprazolam (XANAX) 2 MG tablet Take 2 mg by mouth at bedtime. (Patient taking differently: Take 1 mg by mouth at bedtime.)   amLODipine  (NORVASC ) 5 MG tablet Take 1 tablet (5 mg total) by mouth daily as needed.   azelastine (ASTELIN) 0.1 % nasal spray Place 2 sprays into both nostrils 2 (two) times daily. Use in each nostril as directed   budesonide  (PULMICORT ) 0.5 MG/2ML nebulizer solution Take 2 mLs (0.5 mg total) by nebulization daily.   budesonide -formoterol  (SYMBICORT ) 160-4.5 MCG/ACT inhaler Inhale 2 puffs into the lungs in the morning and at bedtime.   cetirizine  (ZYRTEC ) 10 MG tablet Take 10 mg by mouth daily as needed.    diclofenac  Sodium (VOLTAREN  ARTHRITIS PAIN) 1 % GEL Apply topically 4 (four) times daily.   Dupilumab  (DUPIXENT ) 300 MG/2ML SOAJ Inject 300 mg into the skin every 14 (fourteen) days.   EPINEPHrine 0.3 mg/0.3 mL IJ SOAJ injection Inject into the muscle.   fenofibrate 160 MG tablet Take 160 mg by mouth daily.   fluticasone  (FLONASE ) 50 MCG/ACT nasal spray Place 2 sprays into both nostrils 2 (two)  times daily. (Patient taking differently: Place 2 sprays into both nostrils daily.)   gabapentin  (NEURONTIN ) 300 MG capsule Take 1 capsule (300 mg total) by mouth 3 (three) times daily.   ipratropium (ATROVENT  HFA) 17 MCG/ACT inhaler Inhale 2 puffs into the lungs every 4 (four) hours as needed for wheezing.   ipratropium (ATROVENT ) 0.02 % nebulizer solution Take 2.5 mLs (0.5 mg total) by nebulization every 4 (four) hours as needed for wheezing or shortness of breath.   linaclotide (LINZESS) 72 MCG capsule Take 72 mcg by mouth daily before breakfast.   Magnesium 250 MG CAPS Take 1 capsule by mouth daily at 6 (six) AM.   meloxicam (MOBIC) 15 MG tablet Take 15 mg by mouth daily.   montelukast  (SINGULAIR ) 10  MG tablet TAKE 1 TABLET BY MOUTH AT BEDTIME   NONFORMULARY OR COMPOUNDED ITEM Trimix (30/1/10)-(Pap/Phent/PGE)  Test Dose: Vial 1ml  Qty #3 Refills 0  [facility] [phone] Fax [phone]   omeprazole (PRILOSEC) 20 MG capsule Take 20 mg by mouth in the morning and at bedtime.   tadalafil (CIALIS) 10 MG tablet Take 10 mg by mouth daily.   Tiotropium Bromide Monohydrate  (SPIRIVA  RESPIMAT) 2.5 MCG/ACT AERS INHALE 2 PUFFS BY MOUTH DAILY   traZODone  (DESYREL ) 150 MG tablet TAKE 1 TABLET BY MOUTH EVERYDAY AT BEDTIME   valsartan  (DIOVAN ) 160 MG tablet TAKE 1 TABLET BY MOUTH EVERY DAY   Zinc 50 MG TABS Take 1 tablet by mouth daily at 6 (six) AM.   [DISCONTINUED] albuterol  (PROVENTIL ) (2.5 MG/3ML) 0.083% nebulizer solution Take 3 mLs (2.5 mg total) by nebulization every 6 (six) hours as needed for wheezing or shortness of breath.   [DISCONTINUED] albuterol  (VENTOLIN  HFA) 108 (90 Base) MCG/ACT inhaler Inhale 2 puffs into the lungs every 4 (four) hours as needed.   No facility-administered encounter medications on file as of 03/08/2024.     Review of Systems  Review of Systems  N/a Physical Exam  BP (!) 144/87 (BP Location: Left Arm, Patient Position: Sitting)   Temp 97.6 F (36.4 C) (Oral)   Resp 18   Ht 5' 10 (1.778 m)   Wt 169 lb (76.7 kg)   SpO2 98%   BMI 24.25 kg/m   Wt Readings from Last 5 Encounters:  03/08/24 169 lb (76.7 kg)  02/16/24 170 lb (77.1 kg)  02/10/24 172 lb (78 kg)  01/31/24 166 lb 9.6 oz (75.6 kg)  11/29/23 168 lb 3.2 oz (76.3 kg)    BMI Readings from Last 5 Encounters:  03/08/24 24.25 kg/m  02/16/24 24.39 kg/m  02/10/24 24.68 kg/m  01/31/24 23.90 kg/m  11/29/23 24.13 kg/m     Physical Exam General: In chair, no distress Pulm: Clear bilaterally, no wheeze CV: Warm, no edema Abdomen: Nondistended   Assessment & Plan:   Severe persistent asthma: With minimal improvement of the last few months with escalation of triple inhaled  therapy addition of nebulized budesonide , Dupixent .  Dupixent  does seem to help it wears off after 10 or 11 days.  Advair not effective.  Recently started back on Symbicort .  Continue Spiriva .  And Symbicort .  Dupixent  seem to be helping quite a bit but still with significant breakthrough symptoms.  Stop albuterol  given lack of improvement.  Ipratropium HFA and nebulizer prescribed today.  Consider switching to Combivent and DuoNebs if ipratropium monotherapy not very effective for rescue.  Dyspnea on exertion: No significant improvement despite escalation to triple inhaled therapy and biologic therapy for asthma.  CPET consistent  with bronchospasm.  Not much more to offer other than pretreat with albuterol  prior to exertion.  Most recent TTE did demonstrate RV enlargement.  Pulmonary hypertension considered based on this finding.  Prior TTE with very mild restriction.  High-resolution CT ordered scan given report of upper lung discomfort with deep breaths.  Notably 10/24 CT scan is not concerning for any parenchymal findings.  More recent cardiac/coronary CT scan clear although it omits upper portion of lungs.  CT high-resolution 01/2024 with clear parenchyma.  Did consider right heart catheterization versus referral to academic center for further evaluation.   Return in about 3 months (around 06/06/2024) for f/u Dr. Annella.   Donnice JONELLE Annella, MD 03/11/2024  "

## 2024-03-13 ENCOUNTER — Telehealth: Payer: Self-pay | Admitting: Neurology

## 2024-03-13 ENCOUNTER — Ambulatory Visit: Admitting: Neurology

## 2024-03-13 ENCOUNTER — Encounter: Payer: Self-pay | Admitting: Neurology

## 2024-03-13 NOTE — Progress Notes (Unsigned)
"  ° °  03/15/2024 10:58 AM  Julian Oneal Jun 28, 1965 [id number]   Referring provider: Loreli Kins, MD [address] [place],  [place] [address]  Urological history: 1.  Erectile dysfunction - Failed PDE 5 inhibitors  2.  Family history of metastatic prostate cancer in father  3. BPH with LU TS - PSA (01/2024) 1.2  Chief Complaint  Patient presents with   Erectile Dysfunction    HPI: Julian Oneal is a 59 y.o. male who presents today for ICI titration.    Previous records reviewed.     He has been experiencing issues with ED for + 5.   He is having difficulty with achieving and maintaining erections.  He is no longer having nocturnal tumescence or having morning erections.  He reports persistent ED despite the use of PDE5i's.  No history of priapism, Peyronie's disease or penile trauma.     Physical Exam:  BP (!) 138/90   Pulse 100   Wt 165 lb (74.8 kg)   SpO2 95%   BMI 23.68 kg/m   Constitutional:  Well nourished. Alert and oriented, No acute distress. GU: No CVA tenderness.  No bladder fullness or masses.  Patient with circumcised phallus.  Urethral meatus is patent.  No penile discharge. No penile lesions or rashes.  Psychiatric: Normal mood and affect.   Procedure  Patient's left corpus cavernosum is identified.  An area near the base of the penis is cleansed with rubbing alcohol.  Careful to avoid the dorsal vein, 2 mcg of Trimix (papaverine 30 mg, phentolamine 1 mg and prostaglandin E1 10 mcg, Lot # [id number]$MzfnczAzqnmzIZPI_qvoFzjNyjFfUZWGALskPllBEALyMhKaq$$MzfnczAzqnmzIZPI_qvoFzjNyjFfUZWGALskPllBEALyMhKaq$  exp # [id number] is injected at a 90 degree angle into the left corpus cavernosum near the base of the penis.  Patient experienced a penile fullness in 15 minutes.    Patient's right corpus cavernosum is identified.  An area near the base of the penis is cleansed with rubbing alcohol.  Careful to avoid the dorsal vein, 2 mcg of Trimix (papaverine 30 mg, phentolamine 1 mg and prostaglandin E1 10 mcg, Lot # [id number]$MzfnczAzqnmzIZPI_JmlLQUpksHxFtSrObppDfhmqNuAkQHFR$$MzfnczAzqnmzIZPI_JmlLQUpksHxFtSrObppDfhmqNuAkQHFR$  exp # [id number] is injected at a 90  degree angle into the right corpus cavernosum near the base of the penis.  Patient experienced a penile fullness in 15 minutes.    Patient's left corpus cavernosum is identified.  An area near the base of the penis is cleansed with rubbing alcohol.  Careful to avoid the dorsal vein, 2 mcg of Trimix (papaverine 30 mg, phentolamine 1 mg and prostaglandin E1 10 mcg, Lot # [id number]$MzfnczAzqnmzIZPI_sfnUUXUvARMnavLuOuAgXmWazKfXbWsB$$MzfnczAzqnmzIZPI_sfnUUXUvARMnavLuOuAgXmWazKfXbWsB$  exp # [id number] is injected at a 90 degree angle into the left corpus cavernosum near the base of the penis.  Patient experienced an erection in 15 minutes.    Assessment & Plan:    1.  Erectile dysfunction - taught proper injection technique, including sterile handling, correct anatomical location and dosing - advised to use no more than once in 48-72 hours; instructed to seek care if erection persists beyond 4 hours    Return for I will call patient with results.  Clotilda Cornwall, PA-C   [facility] [address] [place], [place] [address] [phone]  Time Spent: Total time spent on the day of the encounter to include pre-visit record review, face-to-face time with the patient, and post-visit ordering of tests.  15 minutes.      "

## 2024-03-13 NOTE — Telephone Encounter (Signed)
 Pt called to cancel appt due to  being sick   Appt Canceled

## 2024-03-14 ENCOUNTER — Ambulatory Visit (INDEPENDENT_AMBULATORY_CARE_PROVIDER_SITE_OTHER): Admitting: Otolaryngology

## 2024-03-14 ENCOUNTER — Encounter (INDEPENDENT_AMBULATORY_CARE_PROVIDER_SITE_OTHER): Payer: Self-pay | Admitting: Otolaryngology

## 2024-03-14 VITALS — BP 131/82 | HR 78 | Ht 70.0 in | Wt 169.0 lb

## 2024-03-14 DIAGNOSIS — J329 Chronic sinusitis, unspecified: Secondary | ICD-10-CM | POA: Diagnosis not present

## 2024-03-14 DIAGNOSIS — K219 Gastro-esophageal reflux disease without esophagitis: Secondary | ICD-10-CM | POA: Diagnosis not present

## 2024-03-14 DIAGNOSIS — R490 Dysphonia: Secondary | ICD-10-CM | POA: Diagnosis not present

## 2024-03-14 MED ORDER — AMOXICILLIN-POT CLAVULANATE 875-125 MG PO TABS: 1.0000 | ORAL_TABLET | Freq: Two times a day (BID) | ORAL | 0 refills | Status: AC

## 2024-03-14 NOTE — Progress Notes (Signed)
 Reason for Consult: Shortness of breath Referring Physician: Dr. Malachy Marcey HERO Julian Oneal is an 59 y.o. male.  HPI: He is here for evaluation of some hoarseness and shortness of breath of the pulmonary sent him over because of the hoarseness.  He has mucus in the throat.  He has a soreness of the throat occasionally.  The hoarseness is intermittent and sometimes his voice is normal.  He has no dysphagia or odynophagia.  He does have nasal symptoms.  He has blood in the morning.  Sometimes the mucus is yellow coloration.  He has been on reflux treatment twice daily for a long time and that is under control.  He has not been on any antibiotics.  He has tried nasal steroid sprays for his nose.  Past Medical History:  Diagnosis Date   Bell's palsy 2008   Dr Jenel   Dyspnea 08/28/2014   Followed in Pulmonary clinic/ [facility]/ [facility]  - 08/28/2014  Walked RA x 3 laps @ 185 ft each stopped due to end of study, fast pace, no doe or desats    - trial off acei 08/28/2014 >> improved 10/15/2014  - PFT's  10/21/2014  FEV1 3.40 (87 % ) ratio 88  p 6 % improvement from saba with DLCO  83 % corrects to 124 % for alv volume      Excessive daytime sleepiness 04/04/2015   Headache(784.0)    Headaches, cluster 06/20/2013   High cholesterol    Hypertension    Insomnia    Obstructive sleep apnea 07/05/2012   Patient diagnosed with OSA and using CPAP at 6 cm water , with residual AHI of 0.8 on 07-04-12 . [facility] is his  DME .     Persistent headaches 07/05/2012    Referred to [facility] Dr. Oneita in 2013 -     Pneumonia, viral 06/20/2013   Taste absent     Past Surgical History:  Procedure Laterality Date   CHOLECYSTECTOMY  03/02/2001   FOOT SURGERY  03/02/2006   HAND SURGERY  07/2023   HERNIA REPAIR  [phone]   MOUTH SURGERY      Family History  Problem Relation Age of Onset   Prostate cancer Father    Cancer - Prostate Father    Allergies Sister    Heart disease Maternal  Grandmother    Cancer - Prostate Maternal Grandfather    Prostate cancer Maternal Grandfather    COPD Maternal Grandfather    Heart disease Paternal Grandmother    Parkinson's disease Maternal Uncle    Alzheimer's disease Paternal Aunt        in their late 75's   Alzheimer's disease Paternal Uncle        in their late 31's    Social History:  reports that he quit smoking about 34 years ago. His smoking use included cigarettes. He started smoking about 39 years ago. He has a 1.3 pack-year smoking history. He has never used smokeless tobacco. He reports that he does not currently use alcohol. He reports that he does not use drugs.  Allergies: Allergies[1]   No results found for this or any previous visit (from the past 48 hours).  No results found.  ROS There were no vitals taken for this visit. Physical Exam Constitutional:      Appearance: Normal appearance.  HENT:     Head: Normocephalic and atraumatic.     Right Ear: Tympanic membrane is without lesions and middle ear aerated, ear  canal and external ear normal.     Left Ear: Tympanic membrane is without lesions and middle ear aerated, ear canal and external ear normal.     Nose: Nose the septum is deviated to the left.  There is obvious crusted purulent looking debris on the right middle turbinate area.  There is also some debris on the right inferior turbinate.  Turbinates with mild hypertrophy, No significant swelling or masses.     Oral cavity/oropharynx: Mucous membranes are moist. No lesions or masses    Larynx: normal voice. Mirror attempted without success    Eyes:     Extraocular Movements: Extraocular movements intact.     Conjunctiva/sclera: Conjunctivae normal.     Pupils: Pupils are equal, round, and reactive to light.  Cardiovascular:     Rate and Rhythm: Normal rate.  Pulmonary:     Effort: Pulmonary effort is normal.  Musculoskeletal:     Cervical back: Normal range of motion and neck supple. No rigidity.   Lymphadenopathy:     Cervical: No cervical adenopathy or masses.salivary glands without lesions. .     Salivary glands- no mass or swelling Neurological:     Mental Status: He is alert. CN 2-12 intact. No nystagmus      Assessment/Plan: Hoarseness/chronic sinusitis-he has already been treated for reflux.  He has nasal symptoms that could be a factor in all of his throat symptoms.  I started him on Augmentin .  He will use saline irrigation twice daily.  He will continue the Flonase .  He will call after the Augmentin  and if he still symptomatic clindamycin will be called in.  After that a CT scan of his sinuses will be performed to determine next step.  We talked about a fiberoptic exam but for right now since he has normal voice most of the time I do not think a scope is necessary at this visit.  Norleen Notice 03/14/2024, 9:21 AM        [1]  Allergies Allergen Reactions   Amitriptyline     headaches   Silenor [Doxepin Hcl]     headaches   Tribenzor [Olmesartan-Amlodipine -Hctz]     headaches   Bupropion     Other Reaction(s): tremors   Dexmethylphenidate     Other Reaction(s): headaches   Latex Dermatitis   Rimegepant Sulfate     Other Reaction(s): ineffective   Topiramate     Severe headaches, memory loss

## 2024-03-15 ENCOUNTER — Encounter: Payer: Self-pay | Admitting: Urology

## 2024-03-15 ENCOUNTER — Ambulatory Visit: Admitting: Urology

## 2024-03-15 VITALS — BP 138/90 | HR 100 | Wt 165.0 lb

## 2024-03-15 DIAGNOSIS — N529 Male erectile dysfunction, unspecified: Secondary | ICD-10-CM | POA: Diagnosis not present

## 2024-03-15 NOTE — Patient Instructions (Addendum)
 TRIMIX SELF-INJECTION INSTRUCTIONS    DETAILED PROCEDURE  1. GETTING SET UP  A. Proper hygiene is important. Wash your hands and keep the penis clean.  B. Assemble the following:  - Bottle of Trimix  - Alcohol pad  - Syringe  C. Keep the Trimix cold by returning the bottle to the refrigerator, or by placing the bottle in a cup of ice.   2. PREPARE THE SYRINGE  A. Wipe the rubber top of the vial with an alcohol pad.  B. After removing the cap of the needle, pull the plunger back to the desired dosage, filling this volume with air. Use a new needle and syringe each time.  C. Insert the needle through the rubber top and inject the air into the vial.  D. Turn the vial with needle and syringe inserted upside down. Pull back on the syringe plunger in a slow and steady motion until the desired dosage is achieved. (0. 6)  E. Tap the side of the syringe (1cc tuberculin syringe with a 29 gauge needle) to allow any air bubbles to float towards the needle. Avoid having these air bubbles in the syringe when self-injecting by first injecting out the collected bubbles that may form.  F. Remove the needle from the bottle and replace the protective cap on the needle.    3. SELECT AND PREPARE THE SITE FOR INJECTION  A. The proper location for injection is at the 9-11 and 1-3 o'clock positions, between the base and mid-portion of the penis.(see diagram) Avoid the midline because of potential for injury to the urethra (6 o'clock; for urinary passage) and the penile arteries and nerves (near 12 o'clock). Avoid any visible veins or arteries on the surface.  B. Grasp and pull the head of the penis toward the side of your leg with the index finger and thumb (use the left hand, if right handed). While maintaining light tension, select a site for injection.  C. Clean the site with an alcohol pad.   4: INJECT TRIMIX AND APPLY COMPRESSION  A. With a steady and continuous motion, penetrate the skin with the needle at a  90 o angle. The needle should then be advanced to the hub. Slight resistance is encountered as the needle passes into the proper position within the erectile tissue (corporeal body).  B. Inject the Trimix over approximately 4 seconds. Withdraw the needle from the penis and apply compression to the injection site for approximately 1 minute. Several minutes of compression may be required to avoid bleeding, especially if you are an aspirin user.  C. Replace the cap on the needle and dispose of properly.   If you experience a painful erection that will not go down, take four (30 mg) tablets of pseudoephedrine (Sudafed-not the extended release) and if the erection does not go down in the next hour or increases in pain, contact the office immediately or seek treatment in the ED      Please let me know if the 0.6 works for you and when you need refills.

## 2024-03-22 ENCOUNTER — Ambulatory Visit: Admitting: Pulmonary Disease

## 2024-03-24 ENCOUNTER — Telehealth (INDEPENDENT_AMBULATORY_CARE_PROVIDER_SITE_OTHER): Payer: Self-pay

## 2024-03-24 ENCOUNTER — Other Ambulatory Visit (INDEPENDENT_AMBULATORY_CARE_PROVIDER_SITE_OTHER): Payer: Self-pay | Admitting: Otolaryngology

## 2024-03-24 ENCOUNTER — Other Ambulatory Visit: Payer: Self-pay

## 2024-03-24 MED ORDER — CLINDAMYCIN HCL 300 MG PO CAPS: 300.0000 mg | ORAL_CAPSULE | Freq: Three times a day (TID) | ORAL | 0 refills | Status: AC

## 2024-03-24 NOTE — Telephone Encounter (Signed)
 I called patient and informed him that Dr. Roark has sent in a different antibiotic. Patient understood.

## 2024-03-24 NOTE — Telephone Encounter (Signed)
 I spoke with patient when he called into office this morning at 8:46am. He stated that he is calling to inform Dr. Roark that the Amoxicillin  has not helped. I informed patient that Roark is out of office, but I will message him. I will call him back once Roark addresses. Patient understood.

## 2024-03-28 ENCOUNTER — Other Ambulatory Visit: Payer: Self-pay

## 2024-03-28 ENCOUNTER — Other Ambulatory Visit: Payer: Self-pay | Admitting: Pharmacy Technician

## 2024-03-28 NOTE — Progress Notes (Signed)
 Specialty Pharmacy Refill Coordination Note  Julian Oneal is a 59 y.o. male contacted today regarding refills of specialty medication(s) Dupilumab  (Dupixent )   Patient requested Delivery   Delivery date: 04/05/24   Verified address: [address]  [place]   Medication will be filled on: 04/04/24

## 2024-03-30 NOTE — Addendum Note (Signed)
 Addended by: Rosslyn Pasion L on: 03/30/2024 12:31 PM   Modules accepted: Orders

## 2024-04-04 ENCOUNTER — Other Ambulatory Visit: Payer: Self-pay

## 2024-04-05 ENCOUNTER — Other Ambulatory Visit: Payer: Self-pay

## 2024-04-10 ENCOUNTER — Ambulatory Visit: Admitting: Dermatology

## 2024-06-20 ENCOUNTER — Ambulatory Visit: Admitting: Pulmonary Disease
